# Patient Record
Sex: Female | Born: 1940 | Race: White | Hispanic: No | Marital: Married | Smoking: Never smoker
Health system: Southern US, Community
[De-identification: ages and names within clinical notes are randomized; demographics above are authoritative.]

## PROBLEM LIST (undated history)

## (undated) DIAGNOSIS — M1990 Unspecified osteoarthritis, unspecified site: Secondary | ICD-10-CM

## (undated) DIAGNOSIS — M542 Cervicalgia: Secondary | ICD-10-CM

## (undated) DIAGNOSIS — E079 Disorder of thyroid, unspecified: Secondary | ICD-10-CM

## (undated) DIAGNOSIS — C50919 Malignant neoplasm of unspecified site of unspecified female breast: Secondary | ICD-10-CM

## (undated) DIAGNOSIS — D1803 Hemangioma of intra-abdominal structures: Secondary | ICD-10-CM

## (undated) DIAGNOSIS — T7840XA Allergy, unspecified, initial encounter: Secondary | ICD-10-CM

## (undated) DIAGNOSIS — T8859XA Other complications of anesthesia, initial encounter: Secondary | ICD-10-CM

## (undated) DIAGNOSIS — T4145XA Adverse effect of unspecified anesthetic, initial encounter: Secondary | ICD-10-CM

## (undated) HISTORY — DX: Malignant neoplasm of unspecified site of unspecified female breast: C50.919

## (undated) HISTORY — DX: Allergy, unspecified, initial encounter: T78.40XA

## (undated) HISTORY — PX: OTHER SURGICAL HISTORY: SHX169

## (undated) HISTORY — DX: Unspecified osteoarthritis, unspecified site: M19.90

## (undated) HISTORY — DX: Cervicalgia: M54.2

## (undated) HISTORY — PX: COLONOSCOPY: SHX174

## (undated) HISTORY — DX: Disorder of thyroid, unspecified: E07.9

## (undated) HISTORY — DX: Hemangioma of intra-abdominal structures: D18.03

## (undated) HISTORY — PX: ABDOMINAL HYSTERECTOMY: SHX81

---

## 1986-11-11 HISTORY — PX: BREAST SURGERY: SHX581

## 1999-01-22 ENCOUNTER — Other Ambulatory Visit: Admission: RE | Admit: 1999-01-22 | Discharge: 1999-01-22 | Payer: Self-pay | Admitting: *Deleted

## 1999-03-07 ENCOUNTER — Ambulatory Visit (HOSPITAL_COMMUNITY): Admission: RE | Admit: 1999-03-07 | Discharge: 1999-03-07 | Payer: Self-pay | Admitting: Internal Medicine

## 1999-03-09 ENCOUNTER — Encounter: Payer: Self-pay | Admitting: Internal Medicine

## 1999-03-09 ENCOUNTER — Ambulatory Visit (HOSPITAL_COMMUNITY): Admission: RE | Admit: 1999-03-09 | Discharge: 1999-03-09 | Payer: Self-pay | Admitting: Internal Medicine

## 1999-11-15 ENCOUNTER — Ambulatory Visit (HOSPITAL_COMMUNITY): Admission: RE | Admit: 1999-11-15 | Discharge: 1999-11-15 | Payer: Self-pay | Admitting: Surgery

## 1999-11-15 ENCOUNTER — Encounter: Payer: Self-pay | Admitting: Surgery

## 2000-10-28 ENCOUNTER — Ambulatory Visit (HOSPITAL_COMMUNITY): Admission: RE | Admit: 2000-10-28 | Discharge: 2000-10-28 | Payer: Self-pay | Admitting: Surgery

## 2000-10-28 ENCOUNTER — Encounter: Payer: Self-pay | Admitting: Surgery

## 2001-10-21 ENCOUNTER — Other Ambulatory Visit: Admission: RE | Admit: 2001-10-21 | Discharge: 2001-11-12 | Payer: Self-pay

## 2002-07-07 ENCOUNTER — Ambulatory Visit (HOSPITAL_COMMUNITY): Admission: RE | Admit: 2002-07-07 | Discharge: 2002-07-07 | Payer: Self-pay | Admitting: Surgery

## 2002-07-07 ENCOUNTER — Encounter: Payer: Self-pay | Admitting: Surgery

## 2004-07-18 ENCOUNTER — Ambulatory Visit (HOSPITAL_COMMUNITY): Admission: RE | Admit: 2004-07-18 | Discharge: 2004-07-18 | Payer: Self-pay | Admitting: Surgery

## 2005-05-30 ENCOUNTER — Ambulatory Visit (HOSPITAL_COMMUNITY): Admission: RE | Admit: 2005-05-30 | Discharge: 2005-05-30 | Payer: Self-pay | Admitting: Plastic Surgery

## 2006-12-31 ENCOUNTER — Ambulatory Visit (HOSPITAL_COMMUNITY): Admission: RE | Admit: 2006-12-31 | Discharge: 2006-12-31 | Payer: Self-pay | Admitting: Surgery

## 2010-09-24 ENCOUNTER — Encounter: Payer: Self-pay | Admitting: Internal Medicine

## 2010-11-06 ENCOUNTER — Ambulatory Visit: Payer: Self-pay | Admitting: Internal Medicine

## 2010-11-21 ENCOUNTER — Encounter (INDEPENDENT_AMBULATORY_CARE_PROVIDER_SITE_OTHER): Payer: Self-pay | Admitting: *Deleted

## 2010-12-05 ENCOUNTER — Telehealth: Payer: Self-pay | Admitting: Internal Medicine

## 2010-12-06 ENCOUNTER — Encounter: Payer: Self-pay | Admitting: Internal Medicine

## 2010-12-06 ENCOUNTER — Ambulatory Visit
Admission: RE | Admit: 2010-12-06 | Discharge: 2010-12-06 | Payer: Self-pay | Source: Home / Self Care | Attending: Internal Medicine | Admitting: Internal Medicine

## 2010-12-11 NOTE — Letter (Signed)
Summary: Pre Visit Letter Revised  [facility]  [address] [place], [place] [address]   Phone: [phone]  Fax: [phone]        09/24/2010 MRN: [id number]  Krista Dunn [address] [place], [place]  [address]             Procedure Date:  11-20-2010  3:30pm           Dr Lina Sar  Welcome to the Gastroenterology Division at [facility].    You are scheduled to see a nurse for your pre-procedure visit on 11-06-10 at 3:30pm on the 3rd floor at [facility], [address]  We ask that you try to arrive at our office 15 minutes prior to your appointment time to allow for check-in.  Please take a minute to review the attached form.  If you answer "Yes" to one or more of the questions on the first page, we ask that you call the person listed at your earliest opportunity.  If you answer "No" to all of the questions, please complete the rest of the form and bring it to your appointment.    Your nurse visit will consist of discussing your medical and surgical history, your immediate family medical history, and your medications.   If you are unable to list all of your medications on the form, please bring the medication bottles to your appointment and we will list them.  We will need to be aware of both prescribed and over the counter drugs.  We will need to know exact dosage information as well.    Please be prepared to read and sign documents such as consent forms, a financial agreement, and acknowledgement forms.  If necessary, and with your consent, a friend or relative is welcome to sit-in on the nurse visit with you.  Please bring your insurance card so that we may make a copy of it.  If your insurance requires a referral to see a specialist, please bring your referral form from your primary care physician.  No co-pay is required for this nurse visit.     If you cannot keep your appointment, please call [phone] to cancel or reschedule prior  to your appointment date.  This allows [place] the opportunity to schedule an appointment for another patient in need of care.    Thank you for choosing [facility] for your medical needs.  We appreciate the opportunity to care for you.  Please visit [place] at our website  to learn more about our practice.  Sincerely, The Gastroenterology Division

## 2010-12-13 NOTE — Miscellaneous (Signed)
Summary: [facility]  Clinical Lists Changes  Medications: Added new medication of MOVIPREP 100 GM  SOLR (PEG-KCL-NACL-NASULF-NA ASC-C) As per prep instructions. - Signed Rx of MOVIPREP 100 GM  SOLR (PEG-KCL-NACL-NASULF-NA ASC-C) As per prep instructions.;  #1 x 0;  Signed;  Entered by: Ezra Sites RN;  Authorized by: Hart Carwin MD;  Method used: Electronically to [facility] Dr. [phone]*, [address], [place], [place]  [address], Ph: [phone], Fax: [phone] Allergies: Added new allergy or adverse reaction of EPINEPHRINE Added new allergy or adverse reaction of NOVOCAIN Observations: Added new observation of NKA: F (11/22/2010 12:52)    Prescriptions: MOVIPREP 100 GM  SOLR (PEG-KCL-NACL-NASULF-NA ASC-C) As per prep instructions.  #1 x 0   Entered by:   Ezra Sites RN   Authorized by:   Hart Carwin MD   Signed by:   Ezra Sites RN on 11/22/2010   Method used:   Electronically to        [facility] Dr. [phone]* (retail)       [address]       [address]       [place], [place]  [address]       Ph: [phone]       Fax: [phone]   RxID:   [id number]

## 2010-12-13 NOTE — Progress Notes (Signed)
Summary: Question about prep Colon tomorrow  Phone Note Call from Patient Call back at Home Phone [phone]   Call For: Dr Juanda Chance Summary of Call: Question about her prep Colon tomorrow. Initial call taken by: Leanor Kail [facility],  December 05, 2010 10:42 AM  Follow-up for Phone Call        Answered pts questions about diet pertaining to her colonoscopy tomorrow.  Follow-up by: Jennye Boroughs RN,  December 05, 2010 11:12 AM

## 2010-12-13 NOTE — Letter (Signed)
Summary: [facility] Instructions  [facility]  [address] [place], [place] [address]   Phone: [phone]  Fax: [phone]       Krista Dunn    [birth year]    MRN: [id number]        Procedure Day /Date:  Thursday 12/06/2010     Arrival Time: 1:00 pm     Procedure Time:  2:00 pm     Location of Procedure:                    _x _  [facility] (4th Floor)                        PREPARATION FOR COLONOSCOPY WITH MOVIPREP   Starting 5 days prior to your procedure Saturday 1/21 do not eat nuts, seeds, popcorn, corn, beans, peas,  salads, or any raw vegetables.  Do not take any fiber supplements (e.g. Metamucil, Citrucel, and Benefiber).  THE DAY BEFORE YOUR PROCEDURE         DATE: Wednesday 1/25  1.  Drink clear liquids the entire day-NO SOLID FOOD  2.  Do not drink anything colored red or purple.  Avoid juices with pulp.  No orange juice.  3.  Drink at least 64 oz. (8 glasses) of fluid/clear liquids during the day to prevent dehydration and help the prep work efficiently.  CLEAR LIQUIDS INCLUDE: Water Jello Ice Popsicles Tea (sugar ok, no milk/cream) Powdered fruit flavored drinks Coffee (sugar ok, no milk/cream) Gatorade Juice: apple, white grape, white cranberry  Lemonade Clear bullion, consomm, broth Carbonated beverages (any kind) Strained chicken noodle soup Hard Candy                             4.  In the morning, mix first dose of MoviPrep solution:    Empty 1 Pouch A and 1 Pouch B into the disposable container    Add lukewarm drinking water to the top line of the container. Mix to dissolve    Refrigerate (mixed solution should be used within 24 hrs)  5.  Begin drinking the prep at 5:00 p.m. The MoviPrep container is divided by 4 marks.   Every 15 minutes drink the solution down to the next mark (approximately 8 oz) until the full liter is complete.   6.  Follow completed prep with 16 oz of clear liquid of your choice  (Nothing red or purple).  Continue to drink clear liquids until bedtime.  7.  Before going to bed, mix second dose of MoviPrep solution:    Empty 1 Pouch A and 1 Pouch B into the disposable container    Add lukewarm drinking water to the top line of the container. Mix to dissolve    Refrigerate  THE DAY OF YOUR PROCEDURE      DATE: Thursday 1/26  Beginning at 9:00 a.m. (5 hours before procedure):         1. Every 15 minutes, drink the solution down to the next mark (approx 8 oz) until the full liter is complete.  2. Follow completed prep with 16 oz. of clear liquid of your choice.    3. You may drink clear liquids until 12:00 pm (2 HOURS BEFORE PROCEDURE).   MEDICATION INSTRUCTIONS  Unless otherwise instructed, you should take regular prescription medications with a small sip of water   as early as possible the morning of  your procedure.         OTHER INSTRUCTIONS  You will need a responsible adult at least 69 years of age to accompany you and drive you home.   This person must remain in the waiting room during your procedure.  Wear loose fitting clothing that is easily removed.  Leave jewelry and other valuables at home.  However, you may wish to bring a book to read or  an iPod/MP3 player to listen to music as you wait for your procedure to start.  Remove all body piercing jewelry and leave at home.  Total time from sign-in until discharge is approximately 2-3 hours.  You should go home directly after your procedure and rest.  You can resume normal activities the  day after your procedure.  The day of your procedure you should not:   Drive   Make legal decisions   Operate machinery   Drink alcohol   Return to work  You will receive specific instructions about eating, activities and medications before you leave.    The above instructions have been reviewed and explained to me by   Ezra Sites RN  November 22, 2010 1:30 PM     I fully understand and  can verbalize these instructions _____________________________ Date _________

## 2010-12-13 NOTE — Procedures (Signed)
Summary: Colonoscopy  Patient: Krista Dunn Note: All result statuses are Final unless otherwise noted.  Tests: (1) Colonoscopy (COL)   COL Colonoscopy           DONE     [facility]     [address]     [place], [place]  [address]           COLONOSCOPY PROCEDURE REPORT           PATIENT:  Krista Dunn, Krista Dunn  MR#:  [id number]     BIRTHDATE:  1941/08/29, 69 yrs. old  GENDER:  female     ENDOSCOPIST:  Hedwig Morton. Juanda Chance, MD     REF. BY:  Talbot Grumbling. Creta Levin, M.D.     PROCEDURE DATE:  12/06/2010     PROCEDURE:  Colonoscopy [address]     ASA CLASS:  Class II     INDICATIONS:  colorectal cancer screening, average risk     MEDICATIONS:   Versed 12 mg, Fentanyl 100 mcg           DESCRIPTION OF PROCEDURE:   After the risks benefits and     alternatives of the procedure were thoroughly explained, informed     consent was obtained.  Digital rectal exam was performed and     revealed no rectal masses.   The LB 180AL [id number] endoscope was     introduced through the anus and advanced to the cecum, which was     identified by both the appendix and ileocecal valve, without     limitations.  The quality of the prep was good, using MoviPrep.     The instrument was then slowly withdrawn as the colon was fully     examined.     <<PROCEDUREIMAGES>>           FINDINGS:  Mild diverticulosis was found in the sigmoid colon (see     image1, image7, and image6).  This was otherwise a normal     examination of the colon (see image8, image5, image3, and image4).     Retroflexed views in the rectum revealed no abnormalities.    The     scope was then withdrawn from the patient and the procedure     completed.           COMPLICATIONS:  None     ENDOSCOPIC IMPRESSION:     1) Mild diverticulosis in the sigmoid colon     2) Otherwise normal examination     RECOMMENDATIONS:     1) high fiber diet     REPEAT EXAM:  In 10 year(s) for.           ______________________________     Hedwig Morton. Juanda Chance, MD       CC:           n.     eSIGNED:   Hedwig Morton. Maevis Mumby at 12/06/2010 02:29 PM           Gaspar Garbe, [id number]  Note: An exclamation mark (!) indicates a result that was not dispersed into the flowsheet. Document Creation Date: 12/06/2010 2:30 PM _______________________________________________________________________  (1) Order result status: Final Collection or observation date-time: 12/06/2010 14:21 Requested date-time:  Receipt date-time:  Reported date-time:  Referring Physician:   Ordering Physician: Lina Sar [phone]) Specimen Source:  Source: Launa Grill Order Number: [id number] Lab site:   Appended Document: Colonoscopy    Clinical Lists Changes  Observations: Added new observation of COLONNXTDUE: 11/2020 (12/06/2010 16:20)

## 2011-09-11 ENCOUNTER — Other Ambulatory Visit: Payer: Self-pay | Admitting: Radiology

## 2011-09-11 DIAGNOSIS — C50911 Malignant neoplasm of unspecified site of right female breast: Secondary | ICD-10-CM

## 2011-09-13 ENCOUNTER — Ambulatory Visit
Admission: RE | Admit: 2011-09-13 | Discharge: 2011-09-13 | Disposition: A | Discharge status: Home / Self Care | Payer: Medicare Other | Source: Ambulatory Visit | Attending: Radiology | Admitting: Radiology

## 2011-09-13 ENCOUNTER — Encounter: Payer: Self-pay | Admitting: Oncology

## 2011-09-13 DIAGNOSIS — C50919 Malignant neoplasm of unspecified site of unspecified female breast: Secondary | ICD-10-CM | POA: Insufficient documentation

## 2011-09-13 DIAGNOSIS — C50911 Malignant neoplasm of unspecified site of right female breast: Secondary | ICD-10-CM

## 2011-09-13 HISTORY — DX: Malignant neoplasm of unspecified site of unspecified female breast: C50.919

## 2011-09-13 MED ORDER — GADOBENATE DIMEGLUMINE 529 MG/ML IV SOLN
13.0000 mL | Freq: Once | INTRAVENOUS | Status: AC | PRN
  Administered 2011-09-13: 13 mL via INTRAVENOUS

## 2011-09-18 ENCOUNTER — Encounter (INDEPENDENT_AMBULATORY_CARE_PROVIDER_SITE_OTHER): Payer: Self-pay | Admitting: General Surgery

## 2011-09-18 ENCOUNTER — Encounter: Payer: Self-pay | Admitting: *Deleted

## 2011-09-18 ENCOUNTER — Encounter: Payer: Self-pay | Admitting: Radiation Oncology

## 2011-09-18 ENCOUNTER — Ambulatory Visit: Payer: Medicare Other

## 2011-09-18 ENCOUNTER — Telehealth: Payer: Self-pay | Admitting: *Deleted

## 2011-09-18 ENCOUNTER — Other Ambulatory Visit (HOSPITAL_BASED_OUTPATIENT_CLINIC_OR_DEPARTMENT_OTHER): Payer: Medicare Other

## 2011-09-18 ENCOUNTER — Ambulatory Visit (HOSPITAL_BASED_OUTPATIENT_CLINIC_OR_DEPARTMENT_OTHER): Payer: Medicare Other | Admitting: General Surgery

## 2011-09-18 ENCOUNTER — Ambulatory Visit
Admission: RE | Admit: 2011-09-18 | Discharge: 2011-09-18 | Disposition: A | Discharge status: Home / Self Care | Payer: Medicare Other | Source: Ambulatory Visit | Attending: Radiation Oncology | Admitting: Radiation Oncology

## 2011-09-18 ENCOUNTER — Encounter: Payer: Self-pay | Admitting: Oncology

## 2011-09-18 ENCOUNTER — Ambulatory Visit (HOSPITAL_BASED_OUTPATIENT_CLINIC_OR_DEPARTMENT_OTHER): Payer: Medicare Other | Admitting: Oncology

## 2011-09-18 VITALS — BP 129/76 | HR 73 | Temp 97.9°F | Ht 64.5 in | Wt 166.3 lb

## 2011-09-18 DIAGNOSIS — Z170 Estrogen receptor positive status [ER+]: Secondary | ICD-10-CM

## 2011-09-18 DIAGNOSIS — C50919 Malignant neoplasm of unspecified site of unspecified female breast: Secondary | ICD-10-CM

## 2011-09-18 DIAGNOSIS — C50119 Malignant neoplasm of central portion of unspecified female breast: Secondary | ICD-10-CM

## 2011-09-18 DIAGNOSIS — C50319 Malignant neoplasm of lower-inner quadrant of unspecified female breast: Secondary | ICD-10-CM | POA: Insufficient documentation

## 2011-09-18 DIAGNOSIS — Z510 Encounter for antineoplastic radiation therapy: Secondary | ICD-10-CM | POA: Insufficient documentation

## 2011-09-18 LAB — COMPREHENSIVE METABOLIC PANEL
Albumin: 4.2 g/dL (ref 3.5–5.2)
BUN: 13 mg/dL (ref 6–23)
Calcium: 10.1 mg/dL (ref 8.4–10.5)
Chloride: 101 mEq/L (ref 96–112)
Glucose, Bld: 89 mg/dL (ref 70–99)
Total Bilirubin: 0.9 mg/dL (ref 0.3–1.2)
Total Protein: 7.5 g/dL (ref 6.0–8.3)

## 2011-09-18 LAB — CBC WITH DIFFERENTIAL/PLATELET
Eosinophils Absolute: 0.2 10*3/uL (ref 0.0–0.5)
HGB: 14.4 g/dL (ref 11.6–15.9)
MCHC: 33.9 g/dL (ref 31.5–36.0)
NEUT#: 2.3 10*3/uL (ref 1.5–6.5)
NEUT%: 58.7 % (ref 38.4–76.8)
Platelets: 284 10*3/uL (ref 145–400)
RBC: 4.53 10*6/uL (ref 3.70–5.45)
WBC: 3.9 10*3/uL (ref 3.9–10.3)

## 2011-09-18 LAB — CANCER ANTIGEN 27.29: CA 27.29: 24 U/mL (ref 0–39)

## 2011-09-18 NOTE — Progress Notes (Signed)
New Dx Ca right breast   HPI Krista Dunn is a 70 y.o. female seen in the breast multidisciplinary clinic with a new diagnosis of cancer of the right breast.she recently was evaluated at [facility] for short-term followup of a new area of microcalcifications in the upper inner right breast. Followup mammogram revealed a slightly increased cluster over a small area in the upper inner quadrant. Stereotactic biopsy was performed. 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 She had had no breast symptoms, specifically no lump, pain, nipple discharge, or skin changes. She has a history of a benign right breast lumpectomy years ago in the inferior breast. Subsequent breast MRI shows a single 1.2 cm area of enhancement at the known site of the biopsy-proven malignancy but no other abnormalities identified. Liver hemangiomas were also seen. HPI  Past Medical History  Diagnosis Date  . Breast cancer 09/13/2011  . Neck pain   . Liver hemangioma     been follow on CT scan since 2008  . Arthritis     back    Past Surgical History  Procedure Date  . Abdominal hysterectomy   . Right foot surgery     toe implant    Family History  Problem Relation Age of Onset  . Cancer Maternal Grandmother 60    brain cancer  . Cancer Father 55    colon cancer  . Cancer Mother 54    pancreatic cancer  . Cancer Sister 63    breast cancer  . Cancer Paternal Aunt     breast    Social History History  Substance Use Topics  . Smoking status: Never Smoker   . Smokeless tobacco: Not on file  . Alcohol Use: 2.5 oz/week    5 drink(s) per week    Allergies  Allergen Reactions  . Erythromycin Palpitations  . Epinephrine     REACTION: rapid heart rate  . Procaine Hcl     REACTION: rapid heart rate, heart skips beat    Current  Outpatient Prescriptions  Medication Sig Dispense Refill  . CALCIUM & MAGNESIUM CARBONATES PO Take 1,500 mg by mouth daily.        . cholecalciferol (VITAMIN D) 400 UNITS TABS Take 400 Units by mouth 4 (four) times daily.        . diclofenac (VOLTAREN) 75 MG EC tablet Take 75 mg by mouth 2 (two) times daily.        Marland Kitchen HYDROcodone-acetaminophen (VICODIN) 5-500 MG per tablet Take 1 tablet by mouth as needed.        . Red Yeast Rice 600 MG CAPS Take 600 mg by mouth 2 (two) times daily.          Review of Systems Review of Systems  Constitutional: Negative.   HENT: Negative.   Respiratory: Negative.   Cardiovascular: Negative.   Gastrointestinal: Negative.     There were no vitals taken for this visit.  Physical Exam Physical Exam Gen.: Well-developed female in no distress Skin: Warm and dry without rash or infection Lymph nodes: No cervical, supraclavicular, or axillary nodes palpable Lungs: Clear without wheezing or increased work of breathing Breasts: Slight bruising in healing biopsy site in the upper inner right breast. No palpable masses in either breast. No skin or nipple changes. Cardiovascular: Regular rate and rhythm without murmur. No JVD or edema. Abdomen: Soft  and nontender without masses or organomegaly Extremities: No joint swelling deformity or edema Neurologic: Alert and fully oriented. Gait normal. 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 We discussed at length surgical treatment options. I believe she would be a good candidate for breast conservation and this is what she would prefer. We discussed sentinel lymph node biopsy for staging. We discussed the indications for the procedures, the nature and recovery, risks of bleeding, infection, slight risk of lymphedema, and anesthetic risks, all her questions were  answered.    Plan    Needle localized right breast lumpectomy with right axillary sentinel lymph node biopsy as an outpatient. She will undergo genetic testing but this will not do for her initial surgical treatment. Likely postoperative hormonal treatment.       Nomi Rudnicki T 09/18/2011, 12:23 PM

## 2011-09-18 NOTE — Telephone Encounter (Signed)
GAVE PATIENT APPOINTMENT FOR 10-2011 

## 2011-09-18 NOTE — Progress Notes (Signed)
Encounter addended by: Maryln Gottron, MD on: 09/18/2011 11:51 AM<BR>     Documentation filed: Visit Diagnoses, Notes Section

## 2011-09-18 NOTE — Progress Notes (Addendum)
[facility] Radiation Oncology NEW PATIENT EVALUATION  Name: Krista Dunn MRN: [id number]  Date: 09/18/2011  DOB: Oct 31, 1941  Status:outpatient    CC:No primary provider on file.  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 female who is seen today for discussion of possible radiation therapy in the management of her T1, N0, M0 invasive ductal/DCIS of the right breast. At the time of a screening mammogram on 10/24/2010 [facility]) she was noted to have multiple microcalcifications within the lower inner quadrant of the right breast. A six-month followup mammogram was recommended. She return for repeat mammography on 09/04/2011. There were new, loosely clustered microcalcifications seen superiorly in the right breast with no associated mass. The previously noted calcifications inferiorly remained stable. A stereotactic core biopsy on 09/10/2011 revealed invasive ductal carcinoma along with DCIS with calcifications. The biopsy was at 1 to 2:00. For the invasive portion she was strongly ER/PR positive with a proliferation marker of 11%. She was presented at the breast conference this morning and Dr. Colonel Bald felt that the invasive ductal carcinoma was rather focal. Of note is that she has been on hormone replacement therapy for over 15 years. Marland Kitchen   PREVIOUS RADIATION THERAPY: No   PAST MEDICAL HISTORY: Remarkable for hysterectomy just over 15 years ago. History of right foot surgery with toe implant. History of neck pain related to arthritis for which she receives physical therapy. History of liver are hemangiomas.     PAST SURGICAL HISTORY: Past Surgical History  Procedure Date  . Abdominal hysterectomy   . Right foot surgery     toe implant     ETIOLOGIC FACTORS: History of hormone replacement therapy since  her hysterectomy (greater than 15 years).   FAMILY HISTORY: family history includes Cancer in her paternal aunt; Cancer (age of onset:56) in her sister; Cancer (age of onset:60) in her maternal grandmother; Cancer (age of onset:67) in her father; and Cancer (age of onset:70) in her mother.   SOCIAL HISTORY:  reports that she has never smoked. She does not have any smokeless tobacco history on file. She reports that she drinks about 2.5 ounces of alcohol per week.   ALLERGIES: Erythromycin; Epinephrine; and Procaine hcl   MEDICATIONS: Current outpatient prescriptions:CALCIUM & MAGNESIUM CARBONATES PO, Take 1,500 mg by mouth daily.  , Disp: , Rfl: ;  cholecalciferol (VITAMIN D) 400 UNITS TABS, Take 400 Units by mouth 4 (four) times daily.  , Disp: , Rfl: ;  diclofenac (VOLTAREN) 75 MG EC tablet, Take 75 mg by mouth 2 (two) times daily.  , Disp: , Rfl: ;  HYDROcodone-acetaminophen (VICODIN) 5-500 MG per tablet, Take 1 tablet by mouth as needed.  , Disp: , Rfl:  Red Yeast Rice 600 MG CAPS, Take 600 mg by mouth 2 (two) times daily.  , Disp: , Rfl:   REVIEW OF SYSTEMS:  Pending at the time of this dictation.    PHYSICAL EXAM:  VS BP 129/76,P73,RR 20,T 97.9. Alert and oriented. Head and neck examination unremarkable. Nodes: Without palpable cervical, supraclavicular, or axillary lymphadenopathy. Chest: Lungs clear. Back: Without spinal tenderness. Heart: Regular rate and rhythm. Breasts: There is a needle biopsy wound/ecchymosis at 2:00 along the right breast. Scar along inframammary region. No masses are appreciated. Left breast without masses or lesions. Abdomen without hepatomegaly. Extremities without  edema. Neurologic examination grossly nonfocal.   LABORATORY DATA:  Results for orders placed in visit on 09/18/11 (from the past 48 hour(s))  COMPREHENSIVE METABOLIC PANEL     Status: Normal   Collection Time   09/18/11  9:13 AM      Component Value Range Comment   Sodium 138  135 - 145 (mEq/L)     Potassium 4.2  3.5 - 5.3 (mEq/L)    Chloride 101  96 - 112 (mEq/L)    CO2 27  19 - 32 (mEq/L)    Glucose, Bld 89  70 - 99 (mg/dL)    BUN 13  6 - 23 (mg/dL)    Creatinine, Ser 9.60  0.50 - 1.10 (mg/dL)    Total Bilirubin 0.9  0.3 - 1.2 (mg/dL)    Alkaline Phosphatase 112  39 - 117 (U/L)    AST 33  0 - 37 (U/L)    ALT 33  0 - 35 (U/L)    Total Protein 7.5  6.0 - 8.3 (g/dL)    Albumin 4.2  3.5 - 5.2 (g/dL)    Calcium 45.4  8.4 - 10.5 (mg/dL)              IMPRESSION: Clinical stage I (T1, N0, 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 We agree that she should undergo genetic testing and based on her family history. She will inquire as to whether or not her sister had genetic testing 8 years ago. Local treatment options include mastectomy versus partial cystectomy followed by radiation therapy. She inquires about partial breast radiation and I told her that this was cautionary in patients with DCIS. I discussed the potential acute and late toxicities of radiation therapy. She'll now meet with Dr. Johna Sheriff and Dr.Khan.   PLAN: She will have her genetic testing and conservative surgery including a sentinel lymph node biopsy.  I spent 40 minutes minutes face to face with the patient and more than 50% of that time was spent in counseling and/or coordination of care.

## 2011-09-18 NOTE — Progress Notes (Signed)
Referral MD: Dr. Algie Coffer   Reason for Referral: 70 year old female with new diagnosis of invasive ductal carcinoma of the right breast. Diagnosis was made on 09/10/2011.   Chief Complaint  Patient presents with  . Breast Cancer  : History of present illness: Patient is a very pleasant 70 year old female with the diagnosis of invasive ductal carcinoma of the right breast. She had a medical history significant for having had hip osteoarthritis and liver hemangiomas for which she has been followed. The patient began having screening mammograms at the age of 89. She has been very compliant with her mammograms on a yearly basis. Most recently she had her schedule mammogram on 09/10/2003. This mammogram show an an abnormality  in the right breast. She then went on to have an ultrasound performed that revealed a 1.2 cm nodule in the left breast. She had a biopsy of this performed on 09/10/2011. The biopsy revealed an invasive ductal carcinoma with ductal carcinoma in situ with calcifications. The tumor was estrogen receptor positive progesterone receptor positive and HER-2/neu negative. It was grade 1. She went on to have MRI of the breasts performed on 09/13/2011. The MRI showed the central portion of the right breast post biopsy changes. She was also noted to have a 1.2 x 1.0 x 0.8 centimeter area consistent with her known malignancy in this region. She is now seen in the multidisciplinary breast clinic for discussion of her treatment options. She is without any complaints.     Past Medical History  Diagnosis Date  . Breast cancer 09/13/2011  . Neck pain   . Liver hemangioma     been follow on CT scan since 2008  . Arthritis     back  :  Past Surgical History  Procedure Date  . Abdominal hysterectomy   . Right foot surgery     toe implant  :  Current outpatient prescriptions:CALCIUM & MAGNESIUM CARBONATES PO, Take 1,500 mg by mouth daily.  , Disp: , Rfl: ;  cholecalciferol (VITAMIN D) 400  UNITS TABS, Take 400 Units by mouth 4 (four) times daily.  , Disp: , Rfl: ;  diclofenac (VOLTAREN) 75 MG EC tablet, Take 75 mg by mouth 2 (two) times daily.  , Disp: , Rfl: ;  HYDROcodone-acetaminophen (VICODIN) 5-500 MG per tablet, Take 1 tablet by mouth as needed.  , Disp: , Rfl:  Red Yeast Rice 600 MG CAPS, Take 600 mg by mouth 2 (two) times daily.  , Disp: , Rfl: :    :  Allergies  Allergen Reactions  . Erythromycin Palpitations  . Epinephrine     REACTION: rapid heart rate  . Procaine Hcl     REACTION: rapid heart rate, heart skips beat  :  Family History  Problem Relation Age of Onset  . Cancer Maternal Grandmother 60    brain cancer  . Cancer Father 107    colon cancer  . Cancer Mother 13    pancreatic cancer  . Cancer Sister 34    breast cancer  . Cancer Paternal Aunt     breast  :  History   Social History  . Marital Status: Married    Spouse Name: N/A    Number of Children: N/A  . Years of Education: N/A   Occupational History  . 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 Smoking status: Never Smoker   . Smokeless tobacco: Not on file  . Alcohol Use: 2.5 oz/week  5 drink(s) per week  . Drug Use: Not on file  . Sexually Active: Not on file   Other Topics Concern  . Not on file   Social History Narrative  . No narrative on file  :  Constitutional: negative Eyes: negative Ears, nose, mouth, throat, and face: negative Respiratory: negative Cardiovascular: negative Gastrointestinal: negative Genitourinary:negative Integument/breast: Right breast patient is noted to have area of ecchymosis at the site of her recent biopsy. Left breast reveals no masses nipple discharge or any nipple retraction or dominant masses. Hematologic/lymphatic: negative Musculoskeletal:negative Neurological: negative  Exam: @IPVITALS @ General appearance: alert and cooperative Eyes: conjunctivae/corneas clear. PERRL, EOM's intact. Fundi  benign. Ears: normal TM's and external ear canals both ears Throat: lips, mucosa, and tongue normal; teeth and gums normal Neck: no adenopathy, no carotid bruit, no JVD, supple, symmetrical, trachea midline and thyroid not enlarged, symmetric, no tenderness/mass/nodules Back: symmetric, no curvature. ROM normal. No CVA tenderness. Resp: clear to auscultation bilaterally and normal percussion bilaterally Chest wall: no tenderness Breasts: normal appearance, no masses or tenderness, No nipple retraction or dimpling, No nipple discharge or bleeding, No axillary or supraclavicular adenopathy, Normal to palpation without dominant masses, Right breast reveals area of ecchymosis from her recent biopsy. However she has no other masses no nipple discharge no retractions. Cardio: regular rate and rhythm, S1, S2 normal, no murmur, click, rub or gallop GI: soft, non-tender; bowel sounds normal; no masses,  no organomegaly Extremities: extremities normal, atraumatic, no cyanosis or edema Lymph nodes: Cervical, supraclavicular, and axillary nodes normal. Neurologic: Alert and oriented X 3, normal strength and tone. Normal symmetric reflexes. Normal coordination and gait Sensory: normal Motor: grossly normal Reflexes: 2+ and symmetric   Basename 09/18/11 0900  WBC --  HGB 14.4  HCT 42.4  PLT 284    Basename 09/18/11 0913  NA 138  K 4.2  CL 101  CO2 27  GLUCOSE 89  BUN 13  CREATININE 0.72  CALCIUM 10.1      Pathology: Right needle biopsy performed on 09/10/2011 reveals an invasive ductal carcinoma with ductal carcinoma in situ with calcifications. grade 1 without LV 8. Tumor was ER/PR positive HER-2/neu negative.   Mr Breast Bilateral W Wo Contrast  09/13/2011  *RADIOLOGY REPORT*  Clinical Data: The patient has been recently diagnosed with invasive ductal carcinoma and ductal carcinoma in situ following stereotactic guided core biopsy of the right breast.  BUN and creatinine were obtained on  site at [facility] at [address] Results:  BUN 10 mg/dL,  Creatinine 0.7 mg/dL.  BILATERAL BREAST MRI WITH AND WITHOUT CONTRAST  Technique: Multiplanar, multisequence MR images of both breasts were obtained prior to and following the intravenous administration of 13ml of Multihance.  Three dimensional images were evaluated at the independent DynaCad workstation.  Comparison:  Mammogram from [facility] 09/10/2011 and earlier  Findings: Within the central portion of the right breast, there is post biopsy change, consisting of small fluid collection and biopsy clip artifact.  Just posterior and lateral to the biopsy clip, there is a small area of nodular enhancement.  This measures 1.2 x 1.0 x 0.8 cm and demonstrates primarily persistent type enhancement kinetics.  The findings are consistent with known malignancy in this region.  Elsewhere within the right breast, no suspicious enhancement is identified.  Images of the left breast are unremarkable.  Background parenchymal enhancement is minimal.  No suspicious internal mammary or axillary lymph nodes are identified.  Within the right hepatic lobe, at the  dome, there is a 1.2 cm hyperintense lesion on T2-weighted images.  This demonstrates peripheral, nodular type enhancement, most consistent with a hemangioma.  More inferiorly, involving the left and right hepatic lobe, there is a large lesion measuring 8.9 x 14.1 cm.  This also demonstrates peripheral, nodular type enhancement characteristics, consistent with a benign hemangioma.  Although incompletely imaged, the imaging features are felt to be characteristic for a benign process.  IMPRESSION:  1.  Post biopsy change in the upper central portion of the right breast, consistent with site of recently biopsied known malignancy. Enhancement in this region measures a maximum of 1.2 cm. 2.  No findings suspicious for malignancy on the left. 3.  Multiple hemangiomas within the liver.  THREE-DIMENSIONAL MR  IMAGE RENDERING ON INDEPENDENT WORKSTATION:  Three-dimensional MR images were rendered by post-processing of the original MR data on an independent workstation.  The three- dimensional MR images were interpreted, and findings were reported in the accompanying complete MRI report for this study.  BI-RADS CATEGORY 6:  Known biopsy-proven malignancy - appropriate action should be taken.  Original Report Authenticated By: Patterson Hammersmith, M.D.    Assessment and Plan: A pleasant 70 year old female who appears younger than her stated age with new diagnosis of what sounds like this clinical stage I(T1, N0, M0) invasive ductal carcinoma that is low-grade that is grade 1. As he got positive PR positive HER-2/neu negative. Total size of the tumor is 1.2 cm on MRI findings.  #1 patient was seen in the multidisciplinary clinic today she was seen by Dr. Glenna Fellows with Dr. Chipper Herb and myself. Dr. Johna Sheriff has recommended a lumpectomy with sentinel node biopsy. Which is the appropriate treatment for this early stage disease.  #2 patient was also seen by Dr. Chipper Herb. It has been recommended that she undergo radiation therapy to the breast postlumpectomy.  #3 the patient to discuss systemic treatment. Since patient has a very favorable disease we will plan on doing anti-estrogen therapy consisting of an aromatase inhibitor such as Arimidex. Risks and benefits of Arimidex were discussed with the patient and her husband. He did think clinically she'll do very well. However the final treatments decisions will be made at the time of her final pathology. However pathology shows this higher grade tumor or her HER-2 status is different and we certainly can change her treatment options.  Number for reviewing patient's family history it is found that she is how family history significant for breast and ovarian cancer. There is also family history of pancreatic cancer as noted above. In addition his for  patient to have genetic counseling and testing performed for the BRCA1 and 2 gene mutation. I discussed this extensively with the patient today.  #5 I spent 1 hour with the patient greater than 50% of the time was spent in counseling and coordination of care.  #6 I will plan on seeing the patient back after her surgery in about a month.    Drue Second, MD Medical/Oncology [facility] [phone] (beeper) [phone] (Office)  09/18/2011, 1:35 PM

## 2011-09-19 ENCOUNTER — Encounter: Payer: Self-pay | Admitting: *Deleted

## 2011-09-20 ENCOUNTER — Other Ambulatory Visit (INDEPENDENT_AMBULATORY_CARE_PROVIDER_SITE_OTHER): Payer: Self-pay | Admitting: General Surgery

## 2011-09-20 ENCOUNTER — Telehealth: Payer: Self-pay | Admitting: *Deleted

## 2011-09-20 NOTE — Telephone Encounter (Signed)
Spoke to pt concerning sister hx of breast cancer.  Pt relate sister's breast cancer was only in the right breast, sister chose to have bilateral mastectomies d/t invasive breast cancer.  Sister also did not have genetic counseling performed.  Discussed with pt [facility] clinic from 09/18/11.  She denies needs or concerns at this time.  Encourage pt to call with questions.  Received verbal understanding.  Contact information given.

## 2011-09-20 NOTE — H&P (Signed)
       Krista Dunn  Description:  70 year old female  09/18/2011 12:00 PM Office Visit Provider:  Sherrie Marsan T, MD  MRN: [id number] Department:  [facility]            Diagnoses     Cancer of breast - Primary    174.9                     Krista Dunn T, MD 09/20/2011 11:00 AM Signed  New Dx Ca right breast  HPI  Krista Dunn is a 70 y.o. female seen in the breast multidisciplinary clinic with a new diagnosis of cancer of the right breast.she recently was evaluated at [facility] for short-term followup of a new area of microcalcifications in the upper inner right breast. Followup mammogram revealed a slightly increased cluster over a small area in the upper inner quadrant. Stereotactic biopsy was performed. 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 She had had no breast symptoms, specifically no lump, pain, nipple discharge, or skin changes. She has a history of a benign right breast lumpectomy years ago in the inferior breast. Subsequent breast MRI shows a single 1.2 cm area of enhancement at the known site of the biopsy-proven malignancy but no other abnormalities identified. Liver hemangiomas were also seen.  HPI     Past Medical History     Diagnosis  Date     .  Breast cancer  09/13/2011     .  Neck pain      .  Liver hemangioma        been follow on CT scan since 2008     .  Arthritis        back         Past Surgical History     Procedure  Date     .  Abdominal hysterectomy      .  Right foot surgery        toe implant         Family History     Problem  Relation  Age of Onset     .  Cancer  Maternal Grandmother  60       brain cancer      .  Cancer  Father  67        colon cancer      .  Cancer  Mother  70        pancreatic cancer      .  Cancer  Sister  56         breast cancer      .  Cancer  Paternal Aunt         breast       Social History      History      Substance Use Topics      .  Smoking status:  Never Smoker      .  Smokeless tobacco:  Not on file      .  Alcohol Use:  2.5 oz/week        5 drink(s) per week           Allergies      Allergen  Reactions      .  Erythromycin  Palpitations      .    Epinephrine         REACTION: rapid heart rate      .  Procaine Hcl         REACTION: rapid heart rate, heart skips beat           Current Outpatient Prescriptions      Medication  Sig  Dispense  Refill      .  CALCIUM & MAGNESIUM CARBONATES PO  Take 1,500 mg by mouth daily.        .  cholecalciferol (VITAMIN D) 400 UNITS TABS  Take 400 Units by mouth 4 (four) times daily.        .  diclofenac (VOLTAREN) 75 MG EC tablet  Take 75 mg by mouth 2 (two) times daily.        .  HYDROcodone-acetaminophen (VICODIN) 5-500 MG per tablet  Take 1 tablet by mouth as needed.        .  Red Yeast Rice 600 MG CAPS  Take 600 mg by mouth 2 (two) times daily.         Review of Systems  Review of Systems  Constitutional: Negative.  HENT: Negative.  Respiratory: Negative.  Cardiovascular: Negative.  Gastrointestinal: Negative.   There were no vitals taken for this visit.  Physical Exam  Physical Exam  Gen.: Well-developed female in no distress  Skin: Warm and dry without rash or infection  Lymph nodes: No cervical, supraclavicular, or axillary nodes palpable  Lungs: Clear without wheezing or increased work of breathing  Breasts: Slight bruising in healing biopsy site in the upper inner right breast. No palpable masses in either breast. No skin or nipple changes.  Cardiovascular: Regular rate and rhythm without murmur. No JVD or edema.  Abdomen: Soft and nontender without masses or organomegaly  Extremities: No joint swelling deformity or edema  Neurologic: Alert and fully oriented. Gait normal.  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 We discussed at length surgical treatment options. I believe she would be a good candidate for breast conservation and this is what she would prefer. We discussed sentinel lymph node biopsy for staging. We discussed the indications for the procedures, the nature and recovery, risks of bleeding, infection, slight risk of lymphedema, and anesthetic risks, all her questions were answered.   Plan   Needle localized right breast lumpectomy with right axillary sentinel lymph node biopsy as an outpatient. She will undergo genetic testing but this will not do for her initial surgical treatment. Likely postoperative hormonal treatment.   Krista Dunn T  09/18/2011, 12:23 PM                 All Charges for This Encounter       Code  Description  Service Date  Service Provider  Modifiers  Quantity    99203  PR OFFICE/OUTPT VISIT,NEW,LEVL III  09/18/2011  Areen Trautner T Delancey Moraes, MD   1                Other Encounter Related Information     Allergies & Medications      Problem List      History      Patient-Entered Questionnaires       No data filed                      

## 2011-09-23 ENCOUNTER — Other Ambulatory Visit (INDEPENDENT_AMBULATORY_CARE_PROVIDER_SITE_OTHER): Payer: Self-pay | Admitting: General Surgery

## 2011-09-24 ENCOUNTER — Encounter: Payer: Self-pay | Admitting: Oncology

## 2011-09-26 ENCOUNTER — Telehealth: Payer: Self-pay | Admitting: Oncology

## 2011-09-26 ENCOUNTER — Encounter: Payer: Self-pay | Admitting: *Deleted

## 2011-09-26 NOTE — Progress Notes (Signed)
Mailed after appt letter to pt. 

## 2011-09-27 ENCOUNTER — Ambulatory Visit: Payer: Medicare Other

## 2011-09-27 NOTE — Progress Notes (Signed)
Pt seen for genetic counseling.  Blood drawn for BRCA 1/2

## 2011-10-09 ENCOUNTER — Encounter (HOSPITAL_BASED_OUTPATIENT_CLINIC_OR_DEPARTMENT_OTHER): Payer: Self-pay | Admitting: *Deleted

## 2011-10-09 NOTE — Progress Notes (Signed)
To come in for ekg- 

## 2011-10-11 ENCOUNTER — Other Ambulatory Visit (INDEPENDENT_AMBULATORY_CARE_PROVIDER_SITE_OTHER): Payer: Self-pay | Admitting: General Surgery

## 2011-10-11 ENCOUNTER — Other Ambulatory Visit (INDEPENDENT_AMBULATORY_CARE_PROVIDER_SITE_OTHER): Payer: Self-pay

## 2011-10-11 DIAGNOSIS — N6310 Unspecified lump in the right breast, unspecified quadrant: Secondary | ICD-10-CM

## 2011-10-11 DIAGNOSIS — C50911 Malignant neoplasm of unspecified site of right female breast: Secondary | ICD-10-CM

## 2011-10-14 ENCOUNTER — Other Ambulatory Visit (INDEPENDENT_AMBULATORY_CARE_PROVIDER_SITE_OTHER): Payer: Self-pay | Admitting: General Surgery

## 2011-10-14 ENCOUNTER — Ambulatory Visit (HOSPITAL_COMMUNITY)
Admission: RE | Admit: 2011-10-14 | Discharge: 2011-10-14 | Disposition: A | Discharge status: Home / Self Care | Payer: Medicare Other | Source: Ambulatory Visit | Attending: General Surgery | Admitting: General Surgery

## 2011-10-14 ENCOUNTER — Encounter (HOSPITAL_BASED_OUTPATIENT_CLINIC_OR_DEPARTMENT_OTHER): Payer: Self-pay | Admitting: Anesthesiology

## 2011-10-14 ENCOUNTER — Encounter (HOSPITAL_BASED_OUTPATIENT_CLINIC_OR_DEPARTMENT_OTHER): Payer: Self-pay | Admitting: Certified Registered"

## 2011-10-14 ENCOUNTER — Ambulatory Visit (HOSPITAL_BASED_OUTPATIENT_CLINIC_OR_DEPARTMENT_OTHER)
Admission: RE | Admit: 2011-10-14 | Discharge: 2011-10-14 | Disposition: A | Discharge status: Home / Self Care | Payer: Medicare Other | Source: Ambulatory Visit | Attending: General Surgery | Admitting: General Surgery

## 2011-10-14 ENCOUNTER — Ambulatory Visit: Payer: Medicare Other

## 2011-10-14 ENCOUNTER — Ambulatory Visit (HOSPITAL_BASED_OUTPATIENT_CLINIC_OR_DEPARTMENT_OTHER): Payer: Medicare Other | Admitting: Certified Registered"

## 2011-10-14 ENCOUNTER — Other Ambulatory Visit (HOSPITAL_COMMUNITY): Payer: Medicare Other

## 2011-10-14 ENCOUNTER — Encounter (HOSPITAL_BASED_OUTPATIENT_CLINIC_OR_DEPARTMENT_OTHER)
Admission: RE | Disposition: A | Discharge status: Home / Self Care | Payer: Self-pay | Source: Ambulatory Visit | Attending: General Surgery

## 2011-10-14 DIAGNOSIS — N6310 Unspecified lump in the right breast, unspecified quadrant: Secondary | ICD-10-CM

## 2011-10-14 DIAGNOSIS — C50919 Malignant neoplasm of unspecified site of unspecified female breast: Secondary | ICD-10-CM

## 2011-10-14 DIAGNOSIS — D0590 Unspecified type of carcinoma in situ of unspecified breast: Secondary | ICD-10-CM | POA: Insufficient documentation

## 2011-10-14 DIAGNOSIS — Z01812 Encounter for preprocedural laboratory examination: Secondary | ICD-10-CM | POA: Insufficient documentation

## 2011-10-14 DIAGNOSIS — C50219 Malignant neoplasm of upper-inner quadrant of unspecified female breast: Secondary | ICD-10-CM | POA: Insufficient documentation

## 2011-10-14 HISTORY — DX: Adverse effect of unspecified anesthetic, initial encounter: T41.45XA

## 2011-10-14 HISTORY — DX: Other complications of anesthesia, initial encounter: T88.59XA

## 2011-10-14 HISTORY — PX: LYMPH NODE BIOPSY: SHX201

## 2011-10-14 SURGERY — BREAST LUMPECTOMY WITH SENTINEL LYMPH NODE BX
Anesthesia: General | Site: Breast | Laterality: Right | Wound class: Clean

## 2011-10-14 MED ORDER — MEPERIDINE HCL 25 MG/ML IJ SOLN: 6.2500 mg | INTRAMUSCULAR | Status: DC | PRN

## 2011-10-14 MED ORDER — BUPIVACAINE HCL (PF) 0.25 % IJ SOLN
INTRAMUSCULAR | Status: DC | PRN
  Administered 2011-10-14: 36 mL

## 2011-10-14 MED ORDER — FENTANYL CITRATE 0.05 MG/ML IJ SOLN
INTRAMUSCULAR | Status: DC | PRN
  Administered 2011-10-14: 50 ug via INTRAVENOUS
  Administered 2011-10-14 (×2): 25 ug via INTRAVENOUS

## 2011-10-14 MED ORDER — MIDAZOLAM HCL 2 MG/2ML IJ SOLN
0.5000 mg | INTRAMUSCULAR | Status: DC | PRN
  Administered 2011-10-14: 1 mg via INTRAVENOUS

## 2011-10-14 MED ORDER — CEFAZOLIN SODIUM 1-5 GM-% IV SOLN
1.0000 g | INTRAVENOUS | Status: AC
  Administered 2011-10-14: 1 g via INTRAVENOUS

## 2011-10-14 MED ORDER — ACETAMINOPHEN 10 MG/ML IV SOLN
1000.0000 mg | Freq: Four times a day (QID) | INTRAVENOUS | Status: DC
  Administered 2011-10-14: 1000 mg via INTRAVENOUS

## 2011-10-14 MED ORDER — FENTANYL CITRATE 0.05 MG/ML IJ SOLN: 50.0000 ug | INTRAMUSCULAR | Status: DC | PRN

## 2011-10-14 MED ORDER — LIDOCAINE HCL (CARDIAC) 20 MG/ML IV SOLN
INTRAVENOUS | Status: DC | PRN
  Administered 2011-10-14: 40 mg via INTRAVENOUS

## 2011-10-14 MED ORDER — METHYLENE BLUE 1 % INJ SOLN
INTRAMUSCULAR | Status: DC | PRN
  Administered 2011-10-14: 2 mL via INTRADERMAL

## 2011-10-14 MED ORDER — DROPERIDOL 2.5 MG/ML IJ SOLN
INTRAMUSCULAR | Status: DC | PRN
  Administered 2011-10-14: 0.625 mg via INTRAVENOUS

## 2011-10-14 MED ORDER — FENTANYL CITRATE 0.05 MG/ML IJ SOLN
50.0000 ug | INTRAMUSCULAR | Status: DC | PRN
  Administered 2011-10-14: 50 ug via INTRAVENOUS

## 2011-10-14 MED ORDER — DEXAMETHASONE SODIUM PHOSPHATE 4 MG/ML IJ SOLN
INTRAMUSCULAR | Status: DC | PRN
  Administered 2011-10-14: 10 mg via INTRAVENOUS

## 2011-10-14 MED ORDER — LACTATED RINGERS IV SOLN
INTRAVENOUS | Status: DC
  Administered 2011-10-14 (×2): via INTRAVENOUS

## 2011-10-14 MED ORDER — PROPOFOL 10 MG/ML IV EMUL
INTRAVENOUS | Status: DC | PRN
  Administered 2011-10-14: 130 mg via INTRAVENOUS

## 2011-10-14 MED ORDER — SODIUM CHLORIDE 0.9 % IJ SOLN
INTRAMUSCULAR | Status: DC | PRN
  Administered 2011-10-14: 3 mL

## 2011-10-14 MED ORDER — PROMETHAZINE HCL 25 MG/ML IJ SOLN: 6.2500 mg | INTRAMUSCULAR | Status: DC | PRN

## 2011-10-14 MED ORDER — MIDAZOLAM HCL 5 MG/5ML IJ SOLN
INTRAMUSCULAR | Status: DC | PRN
  Administered 2011-10-14: 1 mg via INTRAVENOUS

## 2011-10-14 MED ORDER — TECHNETIUM TC 99M SULFUR COLLOID FILTERED
1.0000 | Freq: Once | INTRAVENOUS | Status: AC | PRN
  Administered 2011-10-14: 1 via INTRADERMAL

## 2011-10-14 MED ORDER — HYDROCODONE-ACETAMINOPHEN 7.5-750 MG PO TABS: 1.0000 | ORAL_TABLET | ORAL | Status: AC | PRN

## 2011-10-14 MED ORDER — HYDROMORPHONE HCL PF 1 MG/ML IJ SOLN
0.2500 mg | INTRAMUSCULAR | Status: DC | PRN
  Administered 2011-10-14 (×2): 0.5 mg via INTRAVENOUS

## 2011-10-14 MED ORDER — MIDAZOLAM HCL 2 MG/2ML IJ SOLN: 1.0000 mg | INTRAMUSCULAR | Status: DC | PRN

## 2011-10-14 MED ORDER — ONDANSETRON HCL 4 MG/2ML IJ SOLN
INTRAMUSCULAR | Status: DC | PRN
  Administered 2011-10-14: 4 mg via INTRAVENOUS

## 2011-10-14 SURGICAL SUPPLY — 64 items
ADH SKN CLS APL DERMABOND .7 (GAUZE/BANDAGES/DRESSINGS) ×2
APPLIER CLIP 11 MED OPEN (CLIP)
APR CLP MED 11 20 MLT OPN (CLIP)
BINDER BREAST MEDIUM (GAUZE/BANDAGES/DRESSINGS) ×1 IMPLANT
BINDER BREAST XLRG (GAUZE/BANDAGES/DRESSINGS) ×1 IMPLANT
BLADE SURG 10 STRL SS (BLADE) ×3 IMPLANT
BLADE SURG 15 STRL LF DISP TIS (BLADE) ×2 IMPLANT
BLADE SURG 15 STRL SS (BLADE) ×3
CANISTER SUCTION 1200CC (MISCELLANEOUS) ×3 IMPLANT
CHLORAPREP W/TINT 26ML (MISCELLANEOUS) ×3 IMPLANT
CLIP APPLIE 11 MED OPEN (CLIP) IMPLANT
CLIP TI WIDE RED SMALL 6 (CLIP) ×3 IMPLANT
CLOTH BEACON ORANGE TIMEOUT ST (SAFETY) ×3 IMPLANT
COVER MAYO STAND STRL (DRAPES) ×3 IMPLANT
COVER PROBE W GEL 5X96 (DRAPES) ×3 IMPLANT
COVER TABLE BACK 60X90 (DRAPES) ×3 IMPLANT
DECANTER SPIKE VIAL GLASS SM (MISCELLANEOUS) IMPLANT
DERMABOND ADVANCED (GAUZE/BANDAGES/DRESSINGS) ×1
DERMABOND ADVANCED .7 DNX12 (GAUZE/BANDAGES/DRESSINGS) ×4 IMPLANT
DEVICE DUBIN W/COMP PLATE 8390 (MISCELLANEOUS) ×3 IMPLANT
DRAIN CHANNEL 19F RND (DRAIN) IMPLANT
DRAIN HEMOVAC 1/8 X 5 (WOUND CARE) IMPLANT
DRAPE LAPAROSCOPIC ABDOMINAL (DRAPES) ×3 IMPLANT
DRAPE UTILITY XL STRL (DRAPES) ×3 IMPLANT
ELECT COATED BLADE 2.86 ST (ELECTRODE) ×3 IMPLANT
ELECT REM PT RETURN 9FT ADLT (ELECTROSURGICAL) ×3
ELECTRODE REM PT RTRN 9FT ADLT (ELECTROSURGICAL) ×2 IMPLANT
EVACUATOR SILICONE 100CC (DRAIN) IMPLANT
GLOVE BIO SURGEON STRL SZ7 (GLOVE) ×1 IMPLANT
GLOVE BIOGEL M STRL SZ7.5 (GLOVE) ×1 IMPLANT
GLOVE BIOGEL PI IND STRL 8 (GLOVE) ×2 IMPLANT
GLOVE BIOGEL PI INDICATOR 8 (GLOVE) ×1
GLOVE INDICATOR 8.0 STRL GRN (GLOVE) ×1 IMPLANT
GLOVE SS BIOGEL STRL SZ 7.5 (GLOVE) ×2 IMPLANT
GLOVE SUPERSENSE BIOGEL SZ 7.5 (GLOVE) ×2
GOWN PREVENTION PLUS XLARGE (GOWN DISPOSABLE) ×3 IMPLANT
GOWN PREVENTION PLUS XXLARGE (GOWN DISPOSABLE) ×4 IMPLANT
KIT MARKER MARGIN INK (KITS) ×3 IMPLANT
NDL HYPO 25X1 1.5 SAFETY (NEEDLE) ×4 IMPLANT
NDL SAFETY ECLIPSE 18X1.5 (NEEDLE) ×2 IMPLANT
NEEDLE HYPO 18GX1.5 SHARP (NEEDLE) ×3
NEEDLE HYPO 25X1 1.5 SAFETY (NEEDLE) ×6 IMPLANT
NS IRRIG 1000ML POUR BTL (IV SOLUTION) ×3 IMPLANT
PACK BASIN DAY SURGERY FS (CUSTOM PROCEDURE TRAY) ×3 IMPLANT
PAD ALCOHOL SWAB (MISCELLANEOUS) ×3 IMPLANT
PENCIL BUTTON HOLSTER BLD 10FT (ELECTRODE) ×3 IMPLANT
PIN SAFETY STERILE (MISCELLANEOUS) IMPLANT
SLEEVE SCD COMPRESS KNEE MED (MISCELLANEOUS) ×1 IMPLANT
SPONGE LAP 18X18 X RAY DECT (DISPOSABLE) IMPLANT
SPONGE LAP 4X18 X RAY DECT (DISPOSABLE) ×3 IMPLANT
STAPLER VISISTAT 35W (STAPLE) IMPLANT
SUT ETHILON 3 0 FSL (SUTURE) IMPLANT
SUT MON AB 4-0 PC3 18 (SUTURE) IMPLANT
SUT MON AB 5-0 PS2 18 (SUTURE) ×3 IMPLANT
SUT SILK 3 0 SH 30 (SUTURE) IMPLANT
SUT VIC AB 3-0 SH 27 (SUTURE) ×3
SUT VIC AB 3-0 SH 27X BRD (SUTURE) IMPLANT
SUT VICRYL 3-0 CR8 SH (SUTURE) ×1 IMPLANT
SYR CONTROL 10ML LL (SYRINGE) ×6 IMPLANT
TOWEL OR 17X24 6PK STRL BLUE (TOWEL DISPOSABLE) ×6 IMPLANT
TOWEL OR NON WOVEN STRL DISP B (DISPOSABLE) ×3 IMPLANT
TUBE CONNECTING 20X1/4 (TUBING) ×3 IMPLANT
WATER STERILE IRR 1000ML POUR (IV SOLUTION) ×2 IMPLANT
YANKAUER SUCT BULB TIP NO VENT (SUCTIONS) ×3 IMPLANT

## 2011-10-14 NOTE — Anesthesia Preprocedure Evaluation (Addendum)
Anesthesia Evaluation  Patient identified by MRN, date of birth, ID band Patient awake    Reviewed: Allergy & Precautions, H&P , NPO status , Patient's Chart, lab work & pertinent test results  Airway Mallampati: III TM Distance: >3 FB Neck ROM: full    Dental No notable dental hx. (+) Teeth Intact and Caps,    Pulmonary neg pulmonary ROS,  clear to auscultation  Pulmonary exam normal       Cardiovascular neg cardio ROS regular Normal    Neuro/Psych Negative Neurological ROS  Negative Psych ROS   GI/Hepatic negative GI ROS, Neg liver ROS,   Endo/Other  Negative Endocrine ROS  Renal/GU negative Renal ROS  Genitourinary negative   Musculoskeletal   Abdominal   Peds  Hematology negative hematology ROS (+)   Anesthesia Other Findings   Reproductive/Obstetrics negative OB ROS                          Anesthesia Physical Anesthesia Plan  ASA: II  Anesthesia Plan: General   Post-op Pain Management:    Induction: Intravenous  Airway Management Planned: LMA  Additional Equipment:   Intra-op Plan:   Post-operative Plan: Extubation in OR  Informed Consent: I have reviewed the patients History and Physical, chart, labs and discussed the procedure including the risks, benefits and alternatives for the proposed anesthesia with the patient or authorized representative who has indicated his/her understanding and acceptance.     Plan Discussed with: CRNA and Surgeon  Anesthesia Plan Comments:         Anesthesia Quick Evaluation

## 2011-10-14 NOTE — Interval H&P Note (Signed)
History and Physical Interval Note:  10/14/2011 11:47 AM  Krista Dunn  has presented today for surgery, with the diagnosis of right breast cancer   The various methods of treatment have been discussed with the patient and family. After consideration of risks, benefits and other options for treatment, the patient has consented to  Procedure(s): RIGHT BREAST LUMPECTOMY WITH SENTINEL LYMPH NODE BX LYMPH NODE BIOPSY as a surgical intervention .  The patients' history has been reviewed, patient examined, no change in status, stable for surgery.  I have reviewed the patients' chart and labs.  Questions were answered to the patient's satisfaction.     [name] T

## 2011-10-14 NOTE — Op Note (Signed)
  Surgeon: Glenna Fellows T   Assistants: None  Anesthesia: General LMA anesthesia  Indications: patient is a 70 year old female with a recent diagnosis of T1 B. N0 for invasive carcinoma of the right breast discovered with microcalcifications on screening mammogram. The lesion appears to be about 1.2 cm. The after discussion of initial surgical treatment options we have elected to proceed with breast conservation with needle localized lumpectomy and sentinel lymph node biopsy. We discussed the nature of the procedure, indications, risks of anesthetic complications, bleeding, infection, rare risk of lymphedema, and possible need for further surgery based on final pathology.    Procedure Detail: patient is brought to the operating room placed in the supine position on the operating table and laryngeal mask general anesthesia was induced. She had undergone needle localization preoperatively. Also preoperatively 1 mCi of technetium sulfur colloid was injected by nuclear medicine subcutaneously around the areola. After patient timeout procedure under sterile technique 10 cc of dilute methylene blue was injected subcutaneously beneath the right nipple and massaged. The entire right chest axilla upper arm were widely sterilely prepped and draped. I made a curvilinear incision in the upper inner quadrant of the breast and dissection was carried down through the subcutaneous to the breast capsule. The wire was brought into the incision. A generous specimen of tissue was then excised around the shaft and tip of the wire using cautery. The specimen was inked for margins and specimen radiograph obtained showing the clip and the lesion within the specimen although it was relatively closer to the lateral and inferior margins. I then reexcised about 1 cm additional margin incorporating the lateral and inferior margins and this was oriented and sent as a separate specimen. The lumpectomy site was irrigated,  infiltrated with local anesthesia and complete hemostasis obtained. The tumor bed was marked with clips. The breast tissue and subcutaneous tissue was closed with interrupted 3-0 Vicryl. Skin was closed with subcuticular Monocryl. Attention was then turned to the node biopsy. A hot area in the axilla was found with the neoprobe and a small transverse incision made dissection was carried down through the saphenous tissue with cautery and the clavipectoral fascia incised. Using the neoprobe for died careful blunt dissection was carried down into the axilla and a bright blue lymph node with high counts was dissected. It was completely removed cautery and ex vivo had counts of 1200 with a background of 10 or less. This was sent as hot blue right axillary sentinel lymph node. Soft tissue was infiltrated with Marcaine and closed in layers with interrupted 3-0 Vicryl and subcuticular Monocryl. Dermabond was used on both incisions. The sponge needle and instrument counts were correct. The patient was taken to PACU in good condition.   Estimated Blood Loss:  Minimal         Drains: None        Blood Given: none          Specimens: !  1.breast tissue 2.R axillary LN        Complications:  * No complications entered in OR log *         Disposition: PACU - hemodynamically stable.         Condition: stable  Mariella Saa MD, FACS  10/14/2011, 2:05 PM

## 2011-10-14 NOTE — Anesthesia Postprocedure Evaluation (Signed)
  Anesthesia Post-op Note  Patient: Krista Dunn  Procedure(s) Performed:  BREAST LUMPECTOMY WITH SENTINEL LYMPH NODE BX - needle localization at [facility] 9:30/ nuclear medicine injection at 11:30; LYMPH NODE BIOPSY  Patient Location: PACU  Anesthesia Type: General  Level of Consciousness: sedated  Airway and Oxygen Therapy: Patient Spontanous Breathing  Post-op Pain: none  Post-op Assessment: Post-op Vital signs reviewed, Patient's Cardiovascular Status Stable, Respiratory Function Stable, Patent Airway and No signs of Nausea or vomiting  Post-op Vital Signs: Reviewed and stable  Complications: No apparent anesthesia complications

## 2011-10-14 NOTE — Transfer of Care (Signed)
Immediate Anesthesia Transfer of Care Note  Patient: Krista Dunn  Procedure(s) Performed:  BREAST LUMPECTOMY WITH SENTINEL LYMPH NODE BX - needle localization at [facility] 9:30/ nuclear medicine injection at 11:30; LYMPH NODE BIOPSY  Patient Location: PACU  Anesthesia Type: General  Level of Consciousness: sedated  Airway & Oxygen Therapy: Patient Spontanous Breathing and Patient connected to face mask oxygen  Post-op Assessment: Report given to PACU RN and Post -op Vital signs reviewed and stable  Post vital signs: Reviewed and stable  Complications: No apparent anesthesia complications

## 2011-10-14 NOTE — Anesthesia Procedure Notes (Signed)
Procedure Name: LMA Insertion Date/Time: 10/14/2011 12:33 PM Performed by: Radford Pax Pre-anesthesia Checklist: Patient identified, Emergency Drugs available, Suction available, Patient being monitored and Timeout performed Patient Re-evaluated:Patient Re-evaluated prior to inductionOxygen Delivery Method: Circle System Utilized Preoxygenation: Pre-oxygenation with 100% oxygen Intubation Type: IV induction Ventilation: Mask ventilation without difficulty LMA: LMA inserted LMA Size: 4.0 Number of attempts: 1 (atraumatic) Airway Equipment and Method: bite block (bite gard used on rt. posterior) Placement Confirmation: positive ETCO2 Tube secured with: Tape (paper tape) Dental Injury: Teeth and Oropharynx as per pre-operative assessment

## 2011-10-14 NOTE — H&P (View-Only) (Signed)
Krista Dunn  Description:  70 year old female  09/18/2011 12:00 PM Office Visit Provider:  Mariella Saa, MD  MRN: [id number] Department:  [facility]            Diagnoses     Cancer of breast - Primary    174.9                     Mariella Saa, MD 09/20/2011 11:00 AM Signed  New Dx Ca right breast  HPI  Krista Dunn is a 70 y.o. female seen in the breast multidisciplinary clinic with a new diagnosis of cancer of the right breast.she recently was evaluated at [facility] for short-term followup of a new area of microcalcifications in the upper inner right breast. Followup mammogram revealed a slightly increased cluster over a small area in the upper inner quadrant. Stereotactic biopsy was performed. 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 She had had no breast symptoms, specifically no lump, pain, nipple discharge, or skin changes. She has a history of a benign right breast lumpectomy years ago in the inferior breast. Subsequent breast MRI shows a single 1.2 cm area of enhancement at the known site of the biopsy-proven malignancy but no other abnormalities identified. Liver hemangiomas were also seen.  HPI     Past Medical History     Diagnosis  Date     .  Breast cancer  09/13/2011     .  Neck pain      .  Liver hemangioma        been follow on CT scan since 2008     .  Arthritis        back         Past Surgical History     Procedure  Date     .  Abdominal hysterectomy      .  Right foot surgery        toe implant         Family History     Problem  Relation  Age of Onset     .  Cancer  Maternal Grandmother  60       brain cancer      .  Cancer  Father  69        colon cancer      .  Cancer  Mother  4        pancreatic cancer      .  Cancer  Sister  4         breast cancer      .  Cancer  Paternal Aunt         breast       Social History      History      Substance Use Topics      .  Smoking status:  Never Smoker      .  Smokeless tobacco:  Not on file      .  Alcohol Use:  2.5 oz/week        5 drink(s) per week           Allergies      Allergen  Reactions      .  Erythromycin  Palpitations      .  Epinephrine         REACTION: rapid heart rate      .  Procaine Hcl         REACTION: rapid heart rate, heart skips beat           Current Outpatient Prescriptions      Medication  Sig  Dispense  Refill      .  CALCIUM & MAGNESIUM CARBONATES PO  Take 1,500 mg by mouth daily.        .  cholecalciferol (VITAMIN D) 400 UNITS TABS  Take 400 Units by mouth 4 (four) times daily.        .  diclofenac (VOLTAREN) 75 MG EC tablet  Take 75 mg by mouth 2 (two) times daily.        Marland Kitchen  HYDROcodone-acetaminophen (VICODIN) 5-500 MG per tablet  Take 1 tablet by mouth as needed.        .  Red Yeast Rice 600 MG CAPS  Take 600 mg by mouth 2 (two) times daily.         Review of Systems  Review of Systems  Constitutional: Negative.  HENT: Negative.  Respiratory: Negative.  Cardiovascular: Negative.  Gastrointestinal: Negative.   There were no vitals taken for this visit.  Physical Exam  Physical Exam  Gen.: Well-developed female in no distress  Skin: Warm and dry without rash or infection  Lymph nodes: No cervical, supraclavicular, or axillary nodes palpable  Lungs: Clear without wheezing or increased work of breathing  Breasts: Slight bruising in healing biopsy site in the upper inner right breast. No palpable masses in either breast. No skin or nipple changes.  Cardiovascular: Regular rate and rhythm without murmur. No JVD or edema.  Abdomen: Soft and nontender without masses or organomegaly  Extremities: No joint swelling deformity or edema  Neurologic: Alert and fully oriented. Gait normal.  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 We discussed at length surgical treatment options. I believe she would be a good candidate for breast conservation and this is what she would prefer. We discussed sentinel lymph node biopsy for staging. We discussed the indications for the procedures, the nature and recovery, risks of bleeding, infection, slight risk of lymphedema, and anesthetic risks, all her questions were answered.   Plan   Needle localized right breast lumpectomy with right axillary sentinel lymph node biopsy as an outpatient. She will undergo genetic testing but this will not do for her initial surgical treatment. Likely postoperative hormonal treatment.   Amir Glaus T  09/18/2011, 12:23 PM                 All Charges for This Encounter       Code  Description  Service Date  Service Provider  Modifiers  Quantity    [phone]  PR OFFICE/OUTPT VISIT,NEW,LEVL III  09/18/2011  Mariella Saa, MD   1                Other Encounter Related Information     Allergies & Medications      Problem List      History      Patient-Entered Questionnaires       No data filed

## 2011-10-18 ENCOUNTER — Encounter (HOSPITAL_BASED_OUTPATIENT_CLINIC_OR_DEPARTMENT_OTHER): Payer: Self-pay | Admitting: General Surgery

## 2011-10-18 ENCOUNTER — Telehealth (INDEPENDENT_AMBULATORY_CARE_PROVIDER_SITE_OTHER): Payer: Self-pay | Admitting: General Surgery

## 2011-10-18 NOTE — Telephone Encounter (Signed)
Looking for pathology results from surgery. Please review and call patient.

## 2011-10-18 NOTE — Telephone Encounter (Signed)
Called the patient and discuss pathology results.

## 2011-10-23 ENCOUNTER — Other Ambulatory Visit: Payer: Medicare Other | Admitting: Lab

## 2011-10-23 ENCOUNTER — Telehealth: Payer: Self-pay | Admitting: *Deleted

## 2011-10-23 ENCOUNTER — Ambulatory Visit (HOSPITAL_BASED_OUTPATIENT_CLINIC_OR_DEPARTMENT_OTHER): Payer: Medicare Other | Admitting: Oncology

## 2011-10-23 ENCOUNTER — Telehealth (INDEPENDENT_AMBULATORY_CARE_PROVIDER_SITE_OTHER): Payer: Self-pay | Admitting: General Surgery

## 2011-10-23 VITALS — BP 148/72 | HR 80 | Temp 98.1°F | Ht 65.5 in | Wt 166.2 lb

## 2011-10-23 DIAGNOSIS — C50919 Malignant neoplasm of unspecified site of unspecified female breast: Secondary | ICD-10-CM

## 2011-10-23 DIAGNOSIS — Z170 Estrogen receptor positive status [ER+]: Secondary | ICD-10-CM

## 2011-10-23 DIAGNOSIS — C50119 Malignant neoplasm of central portion of unspecified female breast: Secondary | ICD-10-CM

## 2011-10-23 NOTE — Telephone Encounter (Signed)
Spoke to Ms. Mimbs, PO appt given for 11/07/11 @ 11:30 w/Dr. Johna Sheriff.

## 2011-10-23 NOTE — Telephone Encounter (Signed)
gave patient appointment for 11-2011 printed out calendar and gave to the patient 

## 2011-10-28 NOTE — Progress Notes (Signed)
OFFICE PROGRESS NOTE  CC  Dr. Glenna Fellows Dr. Algie Coffer Dr. Chipper Herb   DIAGNOSIS:  70 yo with new diagnosis of invasive ductal carcinoma of the right breast. 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 T2mic,N0,M0  PRIOR THERAPY: 1. S/P lumpectomy on 10/14/11 for right IDC measuring 0.11 cm ER+98%, PR+85%, Her2Neu negative, ki-67 11% (T29mic, N0)  2. Genetic counseling and testing performed on 11/27 for the comprehensive BRCA1 and BRCA2 mutation negative  CURRENT THERAPY:being seen for consideration of adjuvant anti-estrogen therapy and post lumpectomy radiation  INTERVAL HISTORY: Krista Dunn 70 y.o. female returns for follow up visit. Overall she is doing great. She has healed very nicely and is quite pleased with her cosmetic results. She denies any fevers, chills, nausea or vomiting. No pain or weakness. Her surgical scar is healing well. Remainder of the 10 point review of systems is negative.  MEDICAL HISTORY: Past Medical History  Diagnosis Date  . Breast cancer 09/13/2011  . Neck pain   . Liver hemangioma     been follow on CT scan since 2008  . Arthritis     back  . Complication of anesthesia     hard to wake up-goes out fast    ALLERGIES:  is allergic to epinephrine and procaine hcl.  MEDICATIONS:  Current Outpatient Prescriptions  Medication Sig Dispense Refill  . ciprofloxacin (CIPRO) 500 MG tablet Take 500 mg by mouth 2 (two) times daily. Started Monday 10/21/11      . CALCIUM & MAGNESIUM CARBONATES PO Take 1,500 mg by mouth daily.        . cholecalciferol (VITAMIN D) 400 UNITS TABS Take 400 Units by mouth 4 (four) times daily.        . diclofenac (VOLTAREN) 75 MG EC tablet Take 75 mg by mouth 2 (two) times daily.        Marland Kitchen HYDROcodone-acetaminophen (VICODIN) 5-500 MG per tablet Take 1 tablet by mouth as needed.        . Multiple Vitamins-Minerals (MULTIVITAMIN WITH MINERALS)  tablet Take 1 tablet by mouth daily.        . Red Yeast Rice 600 MG CAPS Take 600 mg by mouth 2 (two) times daily.          SURGICAL HISTORY:  Past Surgical History  Procedure Date  . Abdominal hysterectomy   . Right foot surgery     toe implant  . Colonoscopy   . Breast surgery 1988    rt br bx-negative  . Lymph node biopsy 10/14/2011    Procedure: LYMPH NODE BIOPSY;  Surgeon: Mariella Saa, MD;  Location: [facility];  Service: General;  Laterality: Right;    REVIEW OF SYSTEMS:  Constitutional: negative Eyes: negative Ears, nose, mouth, throat, and face: negative Respiratory: negative Cardiovascular: negative Gastrointestinal: negative Genitourinary:negative Integument/breast: positive for breast tenderness and surgical scar Hematologic/lymphatic: negative Musculoskeletal:negative Neurological: negative   PHYSICAL EXAMINATION: General appearance: alert, cooperative, no distress and appears younger than stated age Head: Normocephalic, without obvious abnormality, atraumatic Neck: no adenopathy, no carotid bruit, no JVD, supple, symmetrical, trachea midline and thyroid not enlarged, symmetric, no tenderness/mass/nodules Lymph nodes: Cervical, supraclavicular, and axillary nodes normal. Resp: clear to auscultation bilaterally and normal percussion bilaterally Back: symmetric, no curvature. ROM normal. No CVA tenderness. Cardio: regular rate and rhythm, S1, S2 normal, no murmur, click, rub or gallop and normal apical impulse GI: soft, non-tender; bowel sounds  normal; no masses,  no organomegaly Extremities: extremities normal, atraumatic, no cyanosis or edema Neurologic: Alert and oriented X 3, normal strength and tone. Normal symmetric reflexes. Normal coordination and gait Bilateral Breast Examination: left breast no masses, nipple discharge or retraction; Right breast healing lumpectomy scar, no nipple discharge ECOG PERFORMANCE STATUS: 1 - Symptomatic but  completely ambulatory  Blood pressure 148/72, pulse 80, temperature 98.1 F (36.7 C), temperature source Oral, height 5' 5.5" (1.664 m), weight 166 lb 3.2 oz (75.388 kg).  LABORATORY DATA: Lab Results  Component Value Date   WBC 3.9 09/18/2011   HGB 14.0 10/14/2011   HCT 42.4 09/18/2011   MCV 93.6 09/18/2011   PLT 284 09/18/2011      Chemistry      Component Value Date/Time   NA 138 09/18/2011 0913   K 4.2 09/18/2011 0913   CL 101 09/18/2011 0913   CO2 27 09/18/2011 0913   BUN 13 09/18/2011 0913   CREATININE 0.72 09/18/2011 0913      Component Value Date/Time   CALCIUM 10.1 09/18/2011 0913   ALKPHOS 112 09/18/2011 0913   AST 33 09/18/2011 0913   ALT 33 09/18/2011 0913   BILITOT 0.9 09/18/2011 0913       RADIOGRAPHIC STUDIES:  Nm Sentinel Node Inj-no Rpt (breast)  10/14/2011  CLINICAL DATA: right breast mass   Sulfur colloid was injected intradermally by the nuclear medicine  technologist for breast cancer sentinel node localization.      ASSESSMENT: 70 year old female with new diagnosis of stage I invasive ductal carcinoma, Er+PR+ Her2 Neu negative. S/P lumpectomy of right breast, now seen for adjuvant therapy.   PLAN:  1. Refer to radiation Oncology for post lumpectomy radiation.  2. After radiation I will plan on adjuvant anti-estrogen therapy with one of the Aromatase inhibitors such as arimidex. Risks and benefits of Arimidex therapy discussed with patient.  3. Rational for adjuvant treatment was discussed with patient for a while since she is a little reluctant about taking anything as she is concerned that it will interfere with her quality of life.   All questions were answered. The patient knows to call the clinic with any problems, questions or concerns. We can certainly see the patient much sooner if necessary.  I spent 20 minutes counseling the patient face to face. The total time spent in the appointment was 30 minutes.    Drue Second, MD Medical/Oncology [facility] [phone] (beeper) [phone] (Office)  10/28/2011, 8:25 AM

## 2011-11-07 ENCOUNTER — Encounter (INDEPENDENT_AMBULATORY_CARE_PROVIDER_SITE_OTHER): Payer: Medicare Other | Admitting: General Surgery

## 2011-11-13 ENCOUNTER — Encounter: Payer: Self-pay | Admitting: Radiation Oncology

## 2011-11-13 NOTE — Progress Notes (Signed)
71 year old female. Patient has been on hormone replacement therapy for over 15 years after hysterectomy.  10/24/10 annual screening mammogram suggested abnormality. Six month follow up mammogram  Revealed new, loosely clustered micro calcifications seen superiorly in the right breast. 09/10/11 biopsy revealed invasive ductal carcinoma along with DCIS with calcifications ER and PR positive with a proliferation marker of 11%.S/P 10/14/11 lumpectomy right IDC measure 0.11 cm HER-2 negative. Genetic counseling done 11/27 negative. Dr Milta Deiters 10/29/11 recommends post lumpectomy radiation followed by adjuvant anti estrogen therapy such as Arimidex.   Follow up new consult with Dr. Dayton Scrape 11/13/2010 Allergic to erythromycin, epinephrine, and procaine  No hx of XRT in the past No indication of pacemaker.

## 2011-11-14 ENCOUNTER — Encounter: Payer: Self-pay | Admitting: Radiation Oncology

## 2011-11-14 ENCOUNTER — Ambulatory Visit
Admission: RE | Admit: 2011-11-14 | Discharge: 2011-11-14 | Disposition: A | Discharge status: Home / Self Care | Payer: Medicare Other | Source: Ambulatory Visit | Attending: Radiation Oncology | Admitting: Radiation Oncology

## 2011-11-14 DIAGNOSIS — C50919 Malignant neoplasm of unspecified site of unspecified female breast: Secondary | ICD-10-CM

## 2011-11-14 NOTE — Progress Notes (Signed)
Patient presents to the clinic today unaccompanied for new consult with Dr. Dayton Scrape. Patient seen in breast clinic already by Dr. Dayton Scrape. Patient is alert and oriented to person, place, and time. No distress noted. Steady gait noted. Flat affect noted. Patient states, "I have a lot of distress but, I don't want to see a Child psychotherapist." Complete PATIENT MEASURE OF DISTRESS WORKSHEET with a score of 8 turned into social work. Patient reports that she has not started taking her Effexor yet. Patient reports constant sore left arm pain 6 on a scale of 0-10. Also, patient reports limited ROM of this arm specifically above her head.

## 2011-11-14 NOTE — Progress Notes (Signed)
Please see the Nurse Progress Note in the MD Initial Consult Encounter for this patient. 

## 2011-11-14 NOTE — Progress Notes (Signed)
Completed NUTRITION RISK SCREEN worksheet turned into Zenovia Jarred, RD.

## 2011-11-15 ENCOUNTER — Telehealth: Payer: Self-pay | Admitting: Radiation Oncology

## 2011-11-15 NOTE — Telephone Encounter (Signed)
Received phone call from Tiger, RT RT that she called this patient to inform her of 11/28/2011 2 pm CT/SIM. Eileen Stanford says that the patient verbalized Dr. Dayton Scrape needs to put an order in for her mammogram she has scheduled at [facility] on 11/19/2011 at 1:45pm. Without documentation this was Dr. Rennie Plowman intention this writer plans to follow up with him reference this matter once he returns. Phoned patient informing her of these intentions and she verbalized understanding. Routing this message to Dr. Dayton Scrape.

## 2011-11-18 ENCOUNTER — Telehealth: Payer: Self-pay | Admitting: Radiation Oncology

## 2011-11-18 NOTE — Telephone Encounter (Signed)
Phoned home number listed in demographics to inform patient the order has been placed for her mammogram tomorrow at [facility]. Encouraged patient to call with further needs.

## 2011-11-19 ENCOUNTER — Encounter: Payer: Self-pay | Admitting: *Deleted

## 2011-11-19 ENCOUNTER — Encounter: Payer: Self-pay | Admitting: Radiation Oncology

## 2011-11-19 NOTE — Progress Notes (Signed)
[facility] Psychosocial Distress Screening Clinical Social Work  Clinical Social Work was referred by distress screening protocol. The patient scored a 8 on the Psychosocial Distress Thermometer which indicates severe distress. The patient expressed to RN Malva Cogan that she would not like to meet with a Child psychotherapist. Clinical Social Work unable to assess for distress and other psychosocial needs.  Kathrin Penner, MSW, [facility] Clinical Social Worker [facility] [phone]

## 2011-11-19 NOTE — Progress Notes (Signed)
Krista Dunn had a diagnostic right breast mammogram earlier today at [facility].  Although there were scattered microcalcifications noted within the right breast, there were no calcifications seen in the region of the lumpectomy tumor bed.    ______________________________ Maryln Gottron, M.D. RJM/MEDQ  D:  11/19/2011  T:  11/19/2011  Job:  [id number]

## 2011-11-21 ENCOUNTER — Encounter (INDEPENDENT_AMBULATORY_CARE_PROVIDER_SITE_OTHER): Payer: Self-pay | Admitting: General Surgery

## 2011-11-25 ENCOUNTER — Ambulatory Visit (HOSPITAL_BASED_OUTPATIENT_CLINIC_OR_DEPARTMENT_OTHER): Payer: Medicare Other | Admitting: Oncology

## 2011-11-25 ENCOUNTER — Other Ambulatory Visit: Payer: Medicare Other | Admitting: Lab

## 2011-11-25 DIAGNOSIS — C50919 Malignant neoplasm of unspecified site of unspecified female breast: Secondary | ICD-10-CM

## 2011-11-25 LAB — COMPREHENSIVE METABOLIC PANEL
AST: 28 U/L (ref 0–37)
Albumin: 4.7 g/dL (ref 3.5–5.2)
Alkaline Phosphatase: 96 U/L (ref 39–117)
BUN: 15 mg/dL (ref 6–23)
Potassium: 4.7 mEq/L (ref 3.5–5.3)
Sodium: 140 mEq/L (ref 135–145)
Total Bilirubin: 1 mg/dL (ref 0.3–1.2)

## 2011-11-25 LAB — CBC WITH DIFFERENTIAL/PLATELET
Basophils Absolute: 0.1 10*3/uL (ref 0.0–0.1)
EOS%: 2.3 % (ref 0.0–7.0)
LYMPH%: 26.2 % (ref 14.0–49.7)
MCH: 31.2 pg (ref 25.1–34.0)
MCV: 92.8 fL (ref 79.5–101.0)
MONO%: 6.5 % (ref 0.0–14.0)
Platelets: 279 10*3/uL (ref 145–400)
RBC: 4.54 10*6/uL (ref 3.70–5.45)
RDW: 14.1 % (ref 11.2–14.5)

## 2011-11-25 NOTE — Progress Notes (Signed)
OFFICE PROGRESS NOTE  CC  Dr. Glenna Fellows Dr. Algie Coffer Dr. Chipper Herb   DIAGNOSIS:  71 yo with new diagnosis of invasive ductal carcinoma of the right breast. 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 T39mic,N0,M0  PRIOR THERAPY: 1. S/P lumpectomy on 10/14/11 for right IDC measuring 0.11 cm ER+98%, PR+85%, Her2Neu negative, ki-67 11% (T75mic, N0)  2. Genetic counseling and testing performed on 11/27 for the comprehensive BRCA1 and BRCA2 mutation negative  CURRENT THERAPY:being seen for consideration of adjuvant anti-estrogen therapy and post lumpectomy radiation  INTERVAL HISTORY: Krista Dunn 71 y.o. female returns for follow up visit. Overall she is doing great. Patient met with Dr. Chipper Herb. She is planning on getting radiation therapy to the right breast. She will be simulated next week with hopefully starting her radiation of the following week. Today she feels well she has no fevers chills night sweats headaches shortness of breath chest pains palpitations no myalgias or arthralgias. She has no breast swelling or tenderness or pain. Remainder of the 10 point review of systems is negative.  MEDICAL HISTORY: Past Medical History  Diagnosis Date  . Breast cancer 09/13/2011  . Neck pain   . Liver hemangioma     been follow on CT scan since 2008  . Arthritis     back  . Complication of anesthesia     hard to wake up-goes out fast    ALLERGIES:  is allergic to epinephrine and procaine hcl.  MEDICATIONS:  Current Outpatient Prescriptions  Medication Sig Dispense Refill  . CALCIUM & MAGNESIUM CARBONATES PO Take 1,500 mg by mouth daily.        . cholecalciferol (VITAMIN D) 400 UNITS TABS Take 400 Units by mouth 4 (four) times daily.        . diclofenac (VOLTAREN) 75 MG EC tablet Take 75 mg by mouth 2 (two) times daily.        Marland Kitchen HYDROcodone-acetaminophen (VICODIN) 5-500 MG per tablet Take 1  tablet by mouth as needed.        . Multiple Vitamins-Minerals (MULTIVITAMIN WITH MINERALS) tablet Take 1 tablet by mouth daily.        . Vitamin D, Ergocalciferol, (DRISDOL) 50000 UNITS CAPS       . Red Yeast Rice 600 MG CAPS Take 600 mg by mouth 2 (two) times daily.        Marland Kitchen venlafaxine (EFFEXOR-XR) 37.5 MG 24 hr capsule         SURGICAL HISTORY:  Past Surgical History  Procedure Date  . Right foot surgery     toe implant  . Colonoscopy   . Breast surgery 1988    rt br bx-negative  . Lymph node biopsy 10/14/2011    Procedure: LYMPH NODE BIOPSY;  Surgeon: Mariella Saa, MD;  Location: [facility];  Service: General;  Laterality: Right;  . Abdominal hysterectomy     REVIEW OF SYSTEMS:  Constitutional: negative Eyes: negative Ears, nose, mouth, throat, and face: negative Respiratory: negative Cardiovascular: negative Gastrointestinal: negative Genitourinary:negative Integument/breast: positive for breast tenderness and surgical scar Hematologic/lymphatic: negative Musculoskeletal:negative Neurological: negative   PHYSICAL EXAMINATION: General appearance: alert, cooperative, no distress and appears younger than stated age Head: Normocephalic, without obvious abnormality, atraumatic Neck: no adenopathy, no carotid bruit, no JVD, supple, symmetrical, trachea midline and thyroid not enlarged, symmetric, no tenderness/mass/nodules Lymph nodes: Cervical, supraclavicular, and axillary nodes normal. Resp: clear to auscultation  bilaterally and normal percussion bilaterally Back: symmetric, no curvature. ROM normal. No CVA tenderness. Cardio: regular rate and rhythm, S1, S2 normal, no murmur, click, rub or gallop and normal apical impulse GI: soft, non-tender; bowel sounds normal; no masses,  no organomegaly Extremities: extremities normal, atraumatic, no cyanosis or edema Neurologic: Alert and oriented X 3, normal strength and tone. Normal symmetric reflexes. Normal  coordination and gait Bilateral Breast Examination: left breast no masses, nipple discharge or retraction; Right breast healing lumpectomy scar, no nipple discharge ECOG PERFORMANCE STATUS: 1 - Symptomatic but completely ambulatory  Blood pressure 141/80, pulse 67, temperature 98.2 F (36.8 C), temperature source Oral, height 5\' 5"  (1.651 m), weight 168 lb 9.6 oz (76.476 kg).  LABORATORY DATA: Lab Results  Component Value Date   WBC 5.7 11/25/2011   HGB 14.1 11/25/2011   HCT 42.1 11/25/2011   MCV 92.8 11/25/2011   PLT 279 11/25/2011      Chemistry      Component Value Date/Time   NA 138 09/18/2011 0913   K 4.2 09/18/2011 0913   CL 101 09/18/2011 0913   CO2 27 09/18/2011 0913   BUN 13 09/18/2011 0913   CREATININE 0.72 09/18/2011 0913      Component Value Date/Time   CALCIUM 10.1 09/18/2011 0913   ALKPHOS 112 09/18/2011 0913   AST 33 09/18/2011 0913   ALT 33 09/18/2011 0913   BILITOT 0.9 09/18/2011 0913       RADIOGRAPHIC STUDIES:  Nm Sentinel Node Inj-no Rpt (breast)  10/14/2011  CLINICAL DATA: right breast mass   Sulfur colloid was injected intradermally by the nuclear medicine  technologist for breast cancer sentinel node localization.      ASSESSMENT: 71 year old female with new diagnosis of stage I invasive ductal carcinoma, Er+PR+ Her2 Neu negative. S/P lumpectomy of right breast, now seen for adjuvant therapy.   PLAN: patient was seen by Dr. Chipper Herb. He has recommended radiation therapy to the right breast. Patient now agrees. She will hopefully begin the radiation in the next 2 weeks or so. Once she completes radiation therapy then we will plan on giving her antiestrogen therapy with Arimidex 1 mg daily. All questions were discussed answered today. She will be seen after she completes radiation therapy. Patient knows to call me with any problems questions or concerns in the meantime.  All questions were answered. The patient knows to call the clinic with any problems,  questions or concerns. We can certainly see the patient much sooner if necessary.  I spent 20 minutes counseling the patient face to face. The total time spent in the appointment was 30 minutes.    Drue Second, MD Medical/Oncology [facility] [phone] (beeper) [phone] (Office)  11/25/2011, 3:35 PM

## 2011-11-28 ENCOUNTER — Ambulatory Visit
Admission: RE | Admit: 2011-11-28 | Discharge: 2011-11-28 | Disposition: A | Discharge status: Home / Self Care | Payer: Medicare Other | Source: Ambulatory Visit | Attending: Radiation Oncology | Admitting: Radiation Oncology

## 2011-11-28 DIAGNOSIS — C50919 Malignant neoplasm of unspecified site of unspecified female breast: Secondary | ICD-10-CM

## 2011-11-28 NOTE — Progress Notes (Signed)
Met with patient to discuss RO billing.  Patient had no concerns today. 

## 2011-11-28 NOTE — Progress Notes (Signed)
Simulation/treatment planning note:  The patient was taken to the CT simulator. She was placed prone on the prone breast immobilization device. Her right breast mastectomy scar was marked with a radiopaque wire. She was then scanned. I contoured her tumor bed. Dosimetry contoured remaining normal structures. She was set up to tangential fields. 2 separate multileaf collimators were designed to shield normal shredding structures. I  am prescribing 4500 cGy in 25 sessions utilizing mixed energies to be followed by electron beam boost in the supine position for a further 1000 cGy in 5 sessions. An isodose plan is requested. Dosimetry is also requested.

## 2011-11-29 ENCOUNTER — Encounter: Payer: Self-pay | Admitting: Radiation Oncology

## 2011-11-29 NOTE — Progress Notes (Signed)
Followup note:  This is a redictation of my followup visit with the patient on 11/14/2011.  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 At the time of a screening mammogram on 10/24/2010 [facility]) she was noted to have multiple microcalcifications within the lower inner quadrant of the right breast. A six-month followup mammogram was recommended. She return for repeat mammography on 09/04/2011. There were new, loosely clustered microcalcifications seen superiorly in the right breast with no associated mass. The previously noted calcifications inferiorly remained stable. A stereotactic core biopsy on 09/10/2011 revealed invasive ductal carcinoma along with DCIS with calcifications. The biopsy was at 1 to 2:00. For the invasive portion she was strongly ER/PR positive with a proliferation marker of 11%. She was presented at the breast conference this morning and Dr. Colonel Bald felt that the invasive ductal carcinoma was rather focal. Of note is that she has been on hormone replacement therapy for over 15 years.  She went on  to have a right partial mastectomy and sentinel lymph node biopsy with Dr. Johna Sheriff on 10/14/2011. She is found to have invasive ductal carcinoma spanning 0.11 cm and DCIS with calcifications. The margins appear to be negative. An additional right inferior lateral margin contained a 0.8 cm a focus of invasive ductal carcinoma. There was no evidence for metastatic disease in the solitary sentinel lymph node. Again, her primary tumor was strongly ER/PR positive. She was seen by Dr. Welton Flakes who recommends post radiation therapy adjuvant hormone therapy.  Physical examination: Head and neck examination grossly unremarkable. Nodes: Without palpable cervical or supraclavicular lymphadenopathy. Chest: Lungs clear. Breasts: There is a poor cystectomy wound along the upper inner quadrant of the right  breast extending from 2 to 3:00. No dominant masses are appreciated. Left breast that masses or lesions.  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 I recommend a post operative mammogram to confirm removal of all suspicious microcalcifications (done on 11/19/2011) We discussed the potential daily toxicities of radiation therapy and consent was signed. She was scheduled for simulation/treatment planning on 11/28/2011.  30 minutes was spent face-to-face with the patient, primarily counseling the patient and coordinating her care. Marland Kitchen

## 2011-12-05 ENCOUNTER — Ambulatory Visit
Admission: RE | Admit: 2011-12-05 | Discharge: 2011-12-05 | Disposition: A | Discharge status: Home / Self Care | Payer: Medicare Other | Source: Ambulatory Visit | Attending: Radiation Oncology | Admitting: Radiation Oncology

## 2011-12-05 ENCOUNTER — Encounter: Payer: Self-pay | Admitting: Radiation Oncology

## 2011-12-05 NOTE — Progress Notes (Signed)
Simulation verification note:  The patient underwent simulation verification for tangential prone treatment to her right breast. Her isocenter is in good position and the multileaf collimators contoured the treatment volume appropriately.

## 2011-12-09 ENCOUNTER — Ambulatory Visit
Admission: RE | Admit: 2011-12-09 | Discharge: 2011-12-09 | Disposition: A | Discharge status: Home / Self Care | Payer: Medicare Other | Source: Ambulatory Visit | Attending: Radiation Oncology | Admitting: Radiation Oncology

## 2011-12-10 ENCOUNTER — Ambulatory Visit
Admission: RE | Admit: 2011-12-10 | Discharge: 2011-12-10 | Disposition: A | Discharge status: Home / Self Care | Payer: Medicare Other | Source: Ambulatory Visit | Attending: Radiation Oncology | Admitting: Radiation Oncology

## 2011-12-10 ENCOUNTER — Encounter: Payer: Self-pay | Admitting: Radiation Oncology

## 2011-12-10 DIAGNOSIS — C50919 Malignant neoplasm of unspecified site of unspecified female breast: Secondary | ICD-10-CM

## 2011-12-10 MED ORDER — RADIAPLEXRX EX GEL
Freq: Once | CUTANEOUS | Status: AC
  Administered 2011-12-10: 16:00:00 via TOPICAL

## 2011-12-10 MED ORDER — ALRA NON-METALLIC DEODORANT (RAD-ONC)
1.0000 "application " | Freq: Once | TOPICAL | Status: AC
  Administered 2011-12-10: 1 via TOPICAL

## 2011-12-10 NOTE — Progress Notes (Signed)
Weekly Management Note:  Site:R Breast Current Dose:  360  cGy Projected Dose: 5500  cGy  Narrative: The patient is seen today for routine under treatment assessment. CBCT/MVCT images/port films were reviewed. The chart was reviewed.   She is without complaints today although she does report some dryness of her skin along her upper chest and along the right nipple areola. She has Radioplex gel to use when necessary.  Physical Examination: There were no vitals filed for this visit..  Weight:  . There are no significant skin changes but there is subtle erythema surrounding the right areola, presumably from her sentinel lymph node isotope injection.  Impression: Tolerating radiation therapy well. She probably has a contact dermatitis from her isotope injection from her sentinel lymph node biopsy. She may use hydrocortisone cream when necessary.  Plan: Continue radiation therapy as planned.

## 2011-12-10 NOTE — Progress Notes (Signed)
Patient presented to the clinic today following treatment for under treat visit with Dr. Dayton Scrape and post sim education with Lelon Mast, RN. Patient is alert and oriented to person, place, and time. No distress noted. Steady gait noted. Pleasant affect noted. Patient denies pain at this time. Oriented patient to staff and routine of the clinic. Provided patient with RADIATION THERAPY AND YOU handbook and reviewed pertinent information. Provide patient with Radiaplex and Alra then, reviewed use. Also, provided patient with this writers business card and encouraged her to phone with needs. Patient verbalized understanding of all things reviewed. Patient has no complaints at this time. Patient denies nausea, vomiting, diarrhea or headache. Patient refused to be weighed today. Reported all findings to Dr. Dayton Scrape.

## 2011-12-11 ENCOUNTER — Ambulatory Visit
Admission: RE | Admit: 2011-12-11 | Discharge: 2011-12-11 | Disposition: A | Discharge status: Home / Self Care | Payer: Medicare Other | Source: Ambulatory Visit | Attending: Radiation Oncology | Admitting: Radiation Oncology

## 2011-12-11 ENCOUNTER — Ambulatory Visit: Payer: Medicare Other

## 2011-12-12 ENCOUNTER — Ambulatory Visit
Admission: RE | Admit: 2011-12-12 | Discharge: 2011-12-12 | Disposition: A | Discharge status: Home / Self Care | Payer: Medicare Other | Source: Ambulatory Visit | Attending: Radiation Oncology | Admitting: Radiation Oncology

## 2011-12-13 ENCOUNTER — Ambulatory Visit
Admission: RE | Admit: 2011-12-13 | Discharge: 2011-12-13 | Disposition: A | Discharge status: Home / Self Care | Payer: Medicare Other | Source: Ambulatory Visit | Attending: Radiation Oncology | Admitting: Radiation Oncology

## 2011-12-16 ENCOUNTER — Telehealth: Payer: Self-pay | Admitting: Radiation Oncology

## 2011-12-16 ENCOUNTER — Ambulatory Visit: Payer: Medicare Other

## 2011-12-16 NOTE — Telephone Encounter (Signed)
Patient phoned into the treatment machine and cancelled today's appointment. Phoned patient at home number listed in demographics to check her status. Patient verbalized she is suffering from a bad cold, cough, and headache. Patient states,"I was in bed all day yesterday with this stuff." Patient reports that she will try and make tomorrow's appointment. Encouraged patient to allow time after tomorrow's treatment to see Dr. Dayton Scrape for under treat visit. Patient verbalized understanding.

## 2011-12-17 ENCOUNTER — Encounter: Payer: Self-pay | Admitting: *Deleted

## 2011-12-17 ENCOUNTER — Ambulatory Visit
Admission: RE | Admit: 2011-12-17 | Discharge: 2011-12-17 | Disposition: A | Discharge status: Home / Self Care | Payer: Medicare Other | Source: Ambulatory Visit | Attending: Radiation Oncology | Admitting: Radiation Oncology

## 2011-12-17 ENCOUNTER — Encounter: Payer: Self-pay | Admitting: Radiation Oncology

## 2011-12-17 VITALS — BP 125/82 | HR 81 | Temp 98.0°F | Resp 20 | Wt 169.3 lb

## 2011-12-17 DIAGNOSIS — C50919 Malignant neoplasm of unspecified site of unspecified female breast: Secondary | ICD-10-CM | POA: Insufficient documentation

## 2011-12-17 NOTE — Progress Notes (Signed)
Pt missed yesterday,  stated"i have a cold/ dry, coughing, pheglm, since Saturday, was in bed Sunday headache,sore throat, headache gone today, very slight erythema on right breast, occasional shooting pains right side of breast, pt taking alternating robitussin and nyquil, took vitals, no fever, 3:50 PM

## 2011-12-17 NOTE — Progress Notes (Signed)
Weekly Management Note:  Site:R Breast Current Dose:  1080  cGy Projected Dose: 5500  cGy  Narrative: The patient is seen today for routine under treatment assessment. CBCT/MVCT images/port films were reviewed. The chart was reviewed.   She missed yesterday's treatment because of a URI. No fever. She uses Radioplex gel when necessary.  Physical Examination:  Filed Vitals:   12/17/11 1546  BP: 125/82  Pulse: 81  Temp: 98 F (36.7 C)  Resp: 20  .  Weight: 169 lb 4.8 oz (76.794 kg). Faint erythema the skin with no areas of desquamation along the right breast.  Impression: Tolerating radiation therapy well.  Plan: Continue radiation therapy as planned.

## 2011-12-18 ENCOUNTER — Ambulatory Visit
Admission: RE | Admit: 2011-12-18 | Discharge: 2011-12-18 | Disposition: A | Discharge status: Home / Self Care | Payer: Medicare Other | Source: Ambulatory Visit | Attending: Radiation Oncology | Admitting: Radiation Oncology

## 2011-12-19 ENCOUNTER — Ambulatory Visit
Admission: RE | Admit: 2011-12-19 | Discharge: 2011-12-19 | Disposition: A | Discharge status: Home / Self Care | Payer: Medicare Other | Source: Ambulatory Visit | Attending: Radiation Oncology | Admitting: Radiation Oncology

## 2011-12-20 ENCOUNTER — Ambulatory Visit: Payer: Medicare Other

## 2011-12-20 ENCOUNTER — Ambulatory Visit
Admission: RE | Admit: 2011-12-20 | Discharge: 2011-12-20 | Disposition: A | Discharge status: Home / Self Care | Payer: Medicare Other | Source: Ambulatory Visit | Attending: Radiation Oncology | Admitting: Radiation Oncology

## 2011-12-20 NOTE — Progress Notes (Signed)
saw pt for blood draw for [facility].  Already neg for BRCA 1/2+BART

## 2011-12-23 ENCOUNTER — Encounter: Payer: Self-pay | Admitting: Radiation Oncology

## 2011-12-23 ENCOUNTER — Ambulatory Visit
Admission: RE | Admit: 2011-12-23 | Discharge: 2011-12-23 | Disposition: A | Discharge status: Home / Self Care | Payer: Medicare Other | Source: Ambulatory Visit | Attending: Radiation Oncology | Admitting: Radiation Oncology

## 2011-12-23 VITALS — BP 121/86 | HR 70 | Resp 18 | Wt 170.2 lb

## 2011-12-23 DIAGNOSIS — C50919 Malignant neoplasm of unspecified site of unspecified female breast: Secondary | ICD-10-CM

## 2011-12-23 NOTE — Progress Notes (Signed)
Weekly Management Note:  Site:R Breast Current Dose:  1800  cGy Projected Dose: 5500  cGy  Narrative: The patient is seen today for routine under treatment assessment. CBCT/MVCT images/port films were reviewed. The chart was reviewed.   She developed a pruritic rash behind both ears over the weekend. She was to know if this is related to her radiation therapy. She is not using any new perfume, jewelry/earrings or lotions. She does have Radioplex gel to use when necessary.  Physical Examination:  Filed Vitals:   12/23/11 1540  BP: 121/86  Pulse: 70  Resp: 18  .  Weight: 170 lb 3.2 oz (77.202 kg). There is appears to be a contact dermatitis behind both ears, left greater than right. On inspection of the right breast there is mild erythema with no areas of desquamation.  Impression: Tolerating radiation therapy well. She appears to have a contact dermatitis behind both ears, on related to her radiation therapy. She may use hydrocortisone cream when necessary.  Plan: Continue radiation therapy as planned.

## 2011-12-23 NOTE — Progress Notes (Signed)
Patient presents to the clinic today unaccompanied for under treat visit with Dr. Dayton Scrape. Patient is alert and oriented to person, place, and time. No distress noted. Steady gait noted. Pleasant affect noted. Patient denies pain at this time. Patient denies hyperpigmentation of the right/treated breast. Patient reports using Radiaplex as prescribed. Patient reports that over the weekend she got an itchy rash behind both ears and wondered if it was related to radiation. Educated patient that symptoms related to radiation therapy were localized to the area being treated. Encouraged patient to apply benadryl or cortisone topically to these areas should they reappear. Informed Dr. Dayton Scrape of these findings.

## 2011-12-24 ENCOUNTER — Ambulatory Visit
Admission: RE | Admit: 2011-12-24 | Discharge: 2011-12-24 | Disposition: A | Discharge status: Home / Self Care | Payer: Medicare Other | Source: Ambulatory Visit | Attending: Radiation Oncology | Admitting: Radiation Oncology

## 2011-12-25 ENCOUNTER — Ambulatory Visit
Admission: RE | Admit: 2011-12-25 | Discharge: 2011-12-25 | Disposition: A | Discharge status: Home / Self Care | Payer: Medicare Other | Source: Ambulatory Visit | Attending: Radiation Oncology | Admitting: Radiation Oncology

## 2011-12-25 NOTE — Progress Notes (Signed)
Pt in nursing after xrt today w/unrelieved L shoulder pain. She states this is ongoing x 2 weeks. Per Dr Charlett Blake she had Cortisone injection approx 1 week ago w/good relief. She states Hydrocodone 1/2 tab gives no relief. She cannot tol larger dose due to n/v. Pt feels that her positioning for tx is causing her pain. Advised she try Ibuprofen and ice pack to L shoulder; she states Dr Charlett Blake advised ice, and she has used w/good temporary relief.  She states she has "patches w/medication she used on foot in past". She is asking if she can use these patches. Advised she call Dr Charlett Blake and ask if this is acceptable. Pt would like to see Dr Dayton Scrape; informed her dr has consults this afternoon, but will page him if she wishes. Pt states she will try ice, Ibuprofen and see dr tomorrow to discuss her pain and positioning for tx.

## 2011-12-26 ENCOUNTER — Ambulatory Visit
Admission: RE | Admit: 2011-12-26 | Discharge: 2011-12-26 | Disposition: A | Discharge status: Home / Self Care | Payer: Medicare Other | Source: Ambulatory Visit | Attending: Radiation Oncology | Admitting: Radiation Oncology

## 2011-12-26 DIAGNOSIS — C50919 Malignant neoplasm of unspecified site of unspecified female breast: Secondary | ICD-10-CM

## 2011-12-26 NOTE — Progress Notes (Signed)
Weekly Management Note:  Site:R Breast Current Dose:  2340  cGy Projected Dose: 5500  cGy  Narrative: The patient is seen today for routine under treatment assessment. CBCT/MVCT images/port films were reviewed. The chart was reviewed.   She is having worsening left shoulder pain. This has been ongoing for 2 weeks. She had a cortisone injection 1 week ago with good relief, she is having more pain along the anterior shoulder particularly when lying prone for her prone breast radiation therapy. Her pain is improved with the use of an ice pack.  Physical Examination: There were no vitals filed for this visit..  Weight:  . There is palpable discomfort along her left anterior shoulder/glenoid. There is full left shoulder extension but there is discomfort with left shoulder extension.  Impression: Tolerating radiation therapy well, however, she may have bursitis or rotator cuff problems. She will see Dr. Charlett Blake early next week for further evaluation. We'll try to continue with prone breast radiation if at all possible.  Plan: Continue radiation therapy as planned.

## 2011-12-27 ENCOUNTER — Ambulatory Visit
Admission: RE | Admit: 2011-12-27 | Discharge: 2011-12-27 | Disposition: A | Discharge status: Home / Self Care | Payer: Medicare Other | Source: Ambulatory Visit | Attending: Radiation Oncology | Admitting: Radiation Oncology

## 2011-12-30 ENCOUNTER — Ambulatory Visit
Admission: RE | Admit: 2011-12-30 | Discharge: 2011-12-30 | Disposition: A | Discharge status: Home / Self Care | Payer: Medicare Other | Source: Ambulatory Visit | Attending: Radiation Oncology | Admitting: Radiation Oncology

## 2011-12-30 DIAGNOSIS — C50919 Malignant neoplasm of unspecified site of unspecified female breast: Secondary | ICD-10-CM

## 2011-12-30 NOTE — Progress Notes (Signed)
Simulation note:  A custom neck mold was constructed on a custom breast board for immobilization in the supine position. Her right partial mastectomy scar was marked with a radiopaque wire. Her isocenter for her electron beam boost was placed on the skin surface overlying her tumor bed. I contoured her tumor bed. She is now ready for electron beam simulation. I prescribing 1000 cGy in 5 sessions utilizing 18 MEV electrons. A special port plan is requested. One custom block will be constructed to shield the normal surrounding structures.

## 2011-12-31 ENCOUNTER — Ambulatory Visit
Admission: RE | Admit: 2011-12-31 | Discharge: 2011-12-31 | Disposition: A | Discharge status: Home / Self Care | Payer: Medicare Other | Source: Ambulatory Visit | Attending: Radiation Oncology | Admitting: Radiation Oncology

## 2012-01-01 ENCOUNTER — Ambulatory Visit
Admission: RE | Admit: 2012-01-01 | Discharge: 2012-01-01 | Disposition: A | Discharge status: Home / Self Care | Payer: Medicare Other | Source: Ambulatory Visit | Attending: Radiation Oncology | Admitting: Radiation Oncology

## 2012-01-02 ENCOUNTER — Ambulatory Visit
Admission: RE | Admit: 2012-01-02 | Discharge: 2012-01-02 | Disposition: A | Discharge status: Home / Self Care | Payer: Medicare Other | Source: Ambulatory Visit | Attending: Radiation Oncology | Admitting: Radiation Oncology

## 2012-01-03 ENCOUNTER — Ambulatory Visit
Admission: RE | Admit: 2012-01-03 | Discharge: 2012-01-03 | Disposition: A | Discharge status: Home / Self Care | Payer: Medicare Other | Source: Ambulatory Visit | Attending: Radiation Oncology | Admitting: Radiation Oncology

## 2012-01-06 ENCOUNTER — Ambulatory Visit: Payer: Medicare Other

## 2012-01-07 ENCOUNTER — Ambulatory Visit
Admission: RE | Admit: 2012-01-07 | Discharge: 2012-01-07 | Disposition: A | Discharge status: Home / Self Care | Payer: Medicare Other | Source: Ambulatory Visit | Attending: Radiation Oncology | Admitting: Radiation Oncology

## 2012-01-07 ENCOUNTER — Encounter: Payer: Self-pay | Admitting: Radiation Oncology

## 2012-01-07 VITALS — BP 128/77 | HR 64 | Resp 18

## 2012-01-07 DIAGNOSIS — C50919 Malignant neoplasm of unspecified site of unspecified female breast: Secondary | ICD-10-CM

## 2012-01-07 NOTE — Progress Notes (Signed)
Patient presents to the clinic today accompanied by her husband for an under treat visit with Dr. Dayton Scrape. Patient is alert and oriented to person, place, and time. No distress noted. Steady gait noted. Pleasant affect noted. Patient denies pain at this time. Patient reports a persistent dry cough that is worse at night. Patient reports a sore throat related to the frequent coughing. Patient reports itching around the nipple of the treated breast. Encouraged patient to apply hydrocortisone to this area and radiaplex as directed. Patient reports mild tanning of treated breast. Also, patient reports frequent cramping during the night in both legs. Patient reports increasing her potassium intake as a result. Reported all findings to Dr. Dayton Scrape.

## 2012-01-07 NOTE — Progress Notes (Signed)
Weekly Management Note:  Site:R Breast Current Dose:  3600  cGy Projected Dose: 5500  cGy  Narrative: The patient is seen today for routine under treatment assessment. CBCT/MVCT images/port films were reviewed. The chart was reviewed.   She does report a sore throat and dry cough. This has been present since late last week. She denies fevers. She otherwise feels well. She continues with her Radioplex gel.  Physical Examination:  Filed Vitals:   01/07/12 1609  BP: 128/77  Pulse: 64  Resp: 18  .  Weight:  . Head and neck examination: Oropharynx unremarkable to inspection. Lungs: Clear. Right breast: There is erythema and slight hyperpigmentation of the skin along the right breast with no areas of desquamation.  Impression: Tolerating radiation therapy well. She probably has a viral URI. She is to contact her primary care physician's and she has not improved by early next week.  Plan: Continue radiation therapy as planned.

## 2012-01-08 ENCOUNTER — Ambulatory Visit
Admission: RE | Admit: 2012-01-08 | Discharge: 2012-01-08 | Disposition: A | Discharge status: Home / Self Care | Payer: Medicare Other | Source: Ambulatory Visit | Attending: Radiation Oncology | Admitting: Radiation Oncology

## 2012-01-09 ENCOUNTER — Ambulatory Visit
Admission: RE | Admit: 2012-01-09 | Discharge: 2012-01-09 | Disposition: A | Discharge status: Home / Self Care | Payer: Medicare Other | Source: Ambulatory Visit | Attending: Radiation Oncology | Admitting: Radiation Oncology

## 2012-01-09 ENCOUNTER — Encounter: Payer: Self-pay | Admitting: Radiation Oncology

## 2012-01-09 NOTE — Progress Notes (Signed)
Simulation note  The patient underwent virtual simulation for her right breast electron beam boost. She was set up en face to her right breast tumor bed. One custom block was constructed to conform the field. A special port plan is requested. I prescribing 1000 cGy in 5 sessions utilizing 18 MEV electrons. Electron beam energy was chosen based on the depth of her tumor bed as seen on her CT scan.

## 2012-01-10 ENCOUNTER — Ambulatory Visit
Admission: RE | Admit: 2012-01-10 | Discharge: 2012-01-10 | Disposition: A | Discharge status: Home / Self Care | Payer: Medicare Other | Source: Ambulatory Visit | Attending: Radiation Oncology | Admitting: Radiation Oncology

## 2012-01-13 ENCOUNTER — Encounter: Payer: Self-pay | Admitting: Radiation Oncology

## 2012-01-13 ENCOUNTER — Ambulatory Visit
Admission: RE | Admit: 2012-01-13 | Discharge: 2012-01-13 | Disposition: A | Discharge status: Home / Self Care | Payer: Medicare Other | Source: Ambulatory Visit | Attending: Radiation Oncology | Admitting: Radiation Oncology

## 2012-01-13 VITALS — BP 128/76 | HR 67 | Resp 18

## 2012-01-13 DIAGNOSIS — C50919 Malignant neoplasm of unspecified site of unspecified female breast: Secondary | ICD-10-CM

## 2012-01-13 MED ORDER — BIAFINE EX EMUL
Freq: Every day | CUTANEOUS | Status: DC
  Administered 2012-01-13: 17:00:00 via TOPICAL

## 2012-01-13 NOTE — Progress Notes (Signed)
Weekly Management Note:  Site:R Breast Current Dose:  4230  cGy Projected Dose: 5500  cGy  Narrative: The patient is seen today for routine under treatment assessment. CBCT/MVCT images/port films were reviewed. The chart was reviewed.   She is without complaints today except for more pruritus along the right breast. She has been using Radioplex gel.  Physical Examination:  Filed Vitals:   01/13/12 1613  BP: 128/76  Pulse: 67  Resp: 18  .  Weight:  . There is erythema of the breast with patchy dry desquamation along the inframammary region and axilla.  Impression: Tolerating radiation therapy well. 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 We will change to Biafine cream.  Plan: Continue radiation therapy as planned.

## 2012-01-13 NOTE — Progress Notes (Signed)
Patient presents to the clinic today for under treat visit with Dr. Dayton Scrape. Patient is alert and oriented to person, place, and time. No distress noted. Steady gait noted. Pleasant affect noted. Patient reports intermittent brief sharp shooting pain in her right breast. Patient reports an "itcy rash" between her breast for which is applies Benadryl topical cream. Patient reports the area around her nipple feels hard and raised. Patient reports that when she reaches for something with her right arm she feels a pull in her ribs. Patient reports that her cough and sore throat have resolved. Reported all findings to Dr. Dayton Scrape.

## 2012-01-14 ENCOUNTER — Ambulatory Visit
Admission: RE | Admit: 2012-01-14 | Discharge: 2012-01-14 | Disposition: A | Discharge status: Home / Self Care | Payer: Medicare Other | Source: Ambulatory Visit | Attending: Radiation Oncology | Admitting: Radiation Oncology

## 2012-01-15 ENCOUNTER — Ambulatory Visit
Admission: RE | Admit: 2012-01-15 | Discharge: 2012-01-15 | Disposition: A | Discharge status: Home / Self Care | Payer: Medicare Other | Source: Ambulatory Visit | Attending: Radiation Oncology | Admitting: Radiation Oncology

## 2012-01-16 ENCOUNTER — Ambulatory Visit
Admission: RE | Admit: 2012-01-16 | Discharge: 2012-01-16 | Disposition: A | Discharge status: Home / Self Care | Payer: Medicare Other | Source: Ambulatory Visit | Attending: Radiation Oncology | Admitting: Radiation Oncology

## 2012-01-17 ENCOUNTER — Ambulatory Visit
Admission: RE | Admit: 2012-01-17 | Discharge: 2012-01-17 | Disposition: A | Discharge status: Home / Self Care | Payer: Medicare Other | Source: Ambulatory Visit | Attending: Radiation Oncology | Admitting: Radiation Oncology

## 2012-01-20 ENCOUNTER — Ambulatory Visit
Admission: RE | Admit: 2012-01-20 | Discharge: 2012-01-20 | Disposition: A | Discharge status: Home / Self Care | Payer: Medicare Other | Source: Ambulatory Visit | Attending: Radiation Oncology | Admitting: Radiation Oncology

## 2012-01-20 ENCOUNTER — Encounter: Payer: Self-pay | Admitting: Radiation Oncology

## 2012-01-20 VITALS — BP 134/76 | HR 79 | Resp 18 | Wt 175.6 lb

## 2012-01-20 DIAGNOSIS — C50919 Malignant neoplasm of unspecified site of unspecified female breast: Secondary | ICD-10-CM

## 2012-01-20 MED ORDER — RADIAPLEXRX EX GEL
Freq: Once | CUTANEOUS | Status: AC
  Administered 2012-01-20: 16:00:00 via TOPICAL

## 2012-01-20 NOTE — Progress Notes (Signed)
Weekly Management Note:  Site:R Breast Current Dose:  5300  cGy Projected Dose: 5500  cGy  Narrative: The patient is seen today for routine under treatment assessment. CBCT/MVCT images/port films were reviewed. The chart was reviewed.   No new complaints today. She does have pruritus involving her left breast, particularly in the upper-inner quadrant. She has been using Benadryl ointment in addition to Radioplex gel.  Physical Examination:  Filed Vitals:   01/20/12 1530  BP: 134/76  Pulse: 79  Resp: 18  .  Weight: 175 lb 9.6 oz (79.652 kg). There is mild to moderate erythema along the right breast with a papular reaction along the upper inner aspect of the breast. There are no areas of moist desquamation.  Impression: Tolerating radiation therapy well. She'll finish her radiation therapy tomorrow.  Plan: Continue radiation therapy as planned. She has been given a one-month followup appointment.

## 2012-01-20 NOTE — Progress Notes (Signed)
Patient presents to the clinic today for an under treat visit with Dr. Dayton Scrape. Patient is alert and oriented to person, place, and time. No distress noted. Steady gait noted. Pleasant affect noted. Patient denies pain at this time. Patient has no complaints at this time. Provided patient with additional tube of Radiaplex. Reported all findings to Dr. Dayton Scrape.

## 2012-01-21 ENCOUNTER — Ambulatory Visit
Admission: RE | Admit: 2012-01-21 | Discharge: 2012-01-21 | Disposition: A | Discharge status: Home / Self Care | Payer: Medicare Other | Source: Ambulatory Visit | Attending: Radiation Oncology | Admitting: Radiation Oncology

## 2012-01-23 ENCOUNTER — Encounter: Payer: Self-pay | Admitting: Radiation Oncology

## 2012-01-23 NOTE — Progress Notes (Signed)
[facility] Radiation Oncology  Name:Krista Dunn  Date:01/23/2012           AVW:[id number] DOB:1941-05-19   Status:outpatient    CC: Dr. Jaclynn Guarneri  REFERRING PHYSICIAN: Dr. Jaclynn Guarneri    DIAGNOSIS: Stage I (T1, N0, M0) invasive ductal/DCIS of the right breast   INDICATION FOR TREATMENT: Curative   TREATMENT DATES: 12/09/2011 through 01/21/2012                          SITE/DOSE:   Right breast 4500 cGy 25 sessions right breast boost 1000 cGy 5 sessions                         BEAMS/ENERGY:  6 MV photons tangential fields, treated prone for the first 4500 cGy been changed to a supine position for electron beam boost utilizing 18 MEV electrons en face.                NARRATIVE:   The patient tolerated treatment beautifully with moderate hyperpigmentation/erythema the skin and patchy dry desquamation of the skin by completion of therapy.                         PLAN: Routine followup in one month. Patient instructed to call if questions or worsening complaints in interim.

## 2012-01-24 ENCOUNTER — Encounter: Payer: Self-pay | Admitting: Oncology

## 2012-01-24 ENCOUNTER — Ambulatory Visit: Payer: Medicare Other | Admitting: Oncology

## 2012-01-24 ENCOUNTER — Telehealth: Payer: Self-pay | Admitting: Oncology

## 2012-01-24 ENCOUNTER — Other Ambulatory Visit: Payer: Medicare Other | Admitting: Lab

## 2012-01-24 ENCOUNTER — Other Ambulatory Visit (HOSPITAL_BASED_OUTPATIENT_CLINIC_OR_DEPARTMENT_OTHER): Payer: Medicare Other | Admitting: Lab

## 2012-01-24 ENCOUNTER — Ambulatory Visit (HOSPITAL_BASED_OUTPATIENT_CLINIC_OR_DEPARTMENT_OTHER): Payer: Medicare Other | Admitting: Oncology

## 2012-01-24 VITALS — BP 147/82 | HR 76 | Temp 97.4°F | Ht 65.0 in | Wt 173.2 lb

## 2012-01-24 DIAGNOSIS — Z923 Personal history of irradiation: Secondary | ICD-10-CM

## 2012-01-24 DIAGNOSIS — Z803 Family history of malignant neoplasm of breast: Secondary | ICD-10-CM

## 2012-01-24 DIAGNOSIS — Z170 Estrogen receptor positive status [ER+]: Secondary | ICD-10-CM

## 2012-01-24 DIAGNOSIS — C50919 Malignant neoplasm of unspecified site of unspecified female breast: Secondary | ICD-10-CM

## 2012-01-24 LAB — CBC WITH DIFFERENTIAL/PLATELET
Basophils Absolute: 0 10*3/uL (ref 0.0–0.1)
Eosinophils Absolute: 0.2 10*3/uL (ref 0.0–0.5)
HCT: 39.4 % (ref 34.8–46.6)
HGB: 13.4 g/dL (ref 11.6–15.9)
LYMPH%: 16.6 % (ref 14.0–49.7)
MCV: 92.7 fL (ref 79.5–101.0)
MONO#: 0.3 10*3/uL (ref 0.1–0.9)
MONO%: 6.5 % (ref 0.0–14.0)
NEUT#: 2.9 10*3/uL (ref 1.5–6.5)
NEUT%: 71.7 % (ref 38.4–76.8)
Platelets: 199 10*3/uL (ref 145–400)
WBC: 4.1 10*3/uL (ref 3.9–10.3)

## 2012-01-24 LAB — COMPREHENSIVE METABOLIC PANEL
BUN: 14 mg/dL (ref 6–23)
CO2: 23 mEq/L (ref 19–32)
Creatinine, Ser: 0.72 mg/dL (ref 0.50–1.10)
Glucose, Bld: 93 mg/dL (ref 70–99)
Sodium: 143 mEq/L (ref 135–145)
Total Bilirubin: 0.9 mg/dL (ref 0.3–1.2)
Total Protein: 6.6 g/dL (ref 6.0–8.3)

## 2012-01-24 NOTE — Patient Instructions (Signed)
1. I will see you back in May 15 for follow up   2. Please call me with any problems at [phone]

## 2012-01-24 NOTE — Telephone Encounter (Signed)
gve the pt her may 2013 appt calendar 

## 2012-01-27 NOTE — Progress Notes (Signed)
OFFICE PROGRESS NOTE  CC  Dr. Glenna Fellows Dr. Algie Coffer Dr. Chipper Herb   DIAGNOSIS:  71 yo with: 1. diagnosis of invasive ductal carcinoma of the right breast. 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 T15mic,N0,M0  PRIOR THERAPY: 1. S/P lumpectomy on 10/14/11 for right IDC measuring 0.11 cm ER+98%, PR+85%, Her2Neu negative, ki-67 11% (T80mic, N0)  2. Genetic counseling and testing performed on 11/27 for the comprehensive BRCA1 and BRCA2 mutation negative  3. S/P radiation therapy to the right breast completed on 01/21/12  CURRENT THERAPY:patient at this time declines any anti-estrogen therapy  INTERVAL HISTORY: Krista Dunn 71 y.o. female returns for follow up visit. Overall she is doing great. She has completed her radiation therapy and overall tolerated it very well. She however does have fatigue which is expected. She also tells me that her sister who also has a diagnosis of breast cancer is now at the end stages of her disease. She is being sen by one of my partners. Patient herself has no other complaints. However she is very reluctant at this time to start any further therapy adjuvantly. She is very concerned about the side effects of the therapy. She denies any fevers chills, headaches, nausea or vomiting. Aches or pains.Remainder of the 10 point review of systems is negative.  MEDICAL HISTORY: Past Medical History  Diagnosis Date  . Breast cancer 09/13/2011  . Neck pain   . Liver hemangioma     been follow on CT scan since 2008  . Arthritis     back  . Complication of anesthesia     hard to wake up-goes out fast  . H/O arthroscopy   . Allergy     dilantin =rash    ALLERGIES:  is allergic to epinephrine and procaine hcl.  MEDICATIONS:  Current Outpatient Prescriptions  Medication Sig Dispense Refill  . CALCIUM & MAGNESIUM CARBONATES PO Take 1,500 mg by mouth daily.        .  cholecalciferol (VITAMIN D) 400 UNITS TABS Take 400 Units by mouth 4 (four) times daily.        . diclofenac (VOLTAREN) 75 MG EC tablet Take 75 mg by mouth 2 (two) times daily.        Marland Kitchen HYDROcodone-acetaminophen (VICODIN) 5-500 MG per tablet Take 1 tablet by mouth as needed.        . Multiple Vitamins-Minerals (MULTIVITAMIN WITH MINERALS) tablet Take 1 tablet by mouth daily.        . non-metallic deodorant Thornton Papas) MISC Apply 1 application topically daily as needed.      . Red Yeast Rice 600 MG CAPS Take 600 mg by mouth 2 (two) times daily.        . Vitamin D, Ergocalciferol, (DRISDOL) 50000 UNITS CAPS       . Wound Cleansers (RADIAPLEX EX) Apply topically.      . venlafaxine (EFFEXOR-XR) 37.5 MG 24 hr capsule         SURGICAL HISTORY:  Past Surgical History  Procedure Date  . Right foot surgery     toe implant  . Colonoscopy   . Breast surgery 1988    rt br bx-negative  . Lymph node biopsy 10/14/2011    Procedure: LYMPH NODE BIOPSY;  Surgeon: Mariella Saa, MD;  Location: [facility];  Service: General;  Laterality: Right;  . Abdominal hysterectomy   . Tonsillectomy     REVIEW  OF SYSTEMS:  Constitutional: negative Eyes: negative Ears, nose, mouth, throat, and face: negative Respiratory: negative Cardiovascular: negative Gastrointestinal: negative Genitourinary:negative Integument/breast: positive for breast tenderness and surgical scar Hematologic/lymphatic: negative Musculoskeletal:negative Neurological: negative   PHYSICAL EXAMINATION: General appearance: alert, cooperative, no distress and appears younger than stated age Head: Normocephalic, without obvious abnormality, atraumatic Neck: no adenopathy, no carotid bruit, no JVD, supple, symmetrical, trachea midline and thyroid not enlarged, symmetric, no tenderness/mass/nodules Lymph nodes: Cervical, supraclavicular, and axillary nodes normal. Resp: clear to auscultation bilaterally and normal percussion  bilaterally Back: symmetric, no curvature. ROM normal. No CVA tenderness. Cardio: regular rate and rhythm, S1, S2 normal, no murmur, click, rub or gallop and normal apical impulse GI: soft, non-tender; bowel sounds normal; no masses,  no organomegaly Extremities: extremities normal, atraumatic, no cyanosis or edema Neurologic: Alert and oriented X 3, normal strength and tone. Normal symmetric reflexes. Normal coordination and gait Bilateral Breast Examination: left breast no masses, nipple discharge or retraction; Right breast healing lumpectomy scar, no nipple discharge, redness is noted  ECOG PERFORMANCE STATUS: 1 - Symptomatic but completely ambulatory  Blood pressure 147/82, pulse 76, temperature 97.4 F (36.3 C), temperature source Oral, height 5\' 5"  (1.651 m), weight 173 lb 3.2 oz (78.563 kg).  LABORATORY DATA: Lab Results  Component Value Date   WBC 4.1 01/24/2012   HGB 13.4 01/24/2012   HCT 39.4 01/24/2012   MCV 92.7 01/24/2012   PLT 199 01/24/2012      Chemistry      Component Value Date/Time   NA 143 01/24/2012 1132   K 4.5 01/24/2012 1132   CL 108 01/24/2012 1132   CO2 23 01/24/2012 1132   BUN 14 01/24/2012 1132   CREATININE 0.72 01/24/2012 1132      Component Value Date/Time   CALCIUM 9.6 01/24/2012 1132   ALKPHOS 97 01/24/2012 1132   AST 22 01/24/2012 1132   ALT 24 01/24/2012 1132   BILITOT 0.9 01/24/2012 1132       RADIOGRAPHIC STUDIES:  Nm Sentinel Node Inj-no Rpt (breast)  10/14/2011  CLINICAL DATA: right breast mass   Sulfur colloid was injected intradermally by the nuclear medicine  technologist for breast cancer sentinel node localization.      ASSESSMENT: 71 year old female with:  1.  stage I invasive ductal carcinoma, Er+PR+ Her2 Neu negative. S/P lumpectomy of right breast,   2. S/P radiation therapy completed on 01/23/12  3. Tumor ER+ so to begin adjuvant anti-estrogen but patient declines at present.   PLAN:  1. Extensive discussion about adjuvant  arimidex but patient is very reluctant to start at this time. She feels that the side effects that she may have will for her outweigh the potential benefits.  2. She currently just wants to start feeling better without any other treatments. She understands the potential risks for not taking adjuvant therapy.  All questions were answered. The patient knows to call the clinic with any problems, questions or concerns. We can certainly see the patient much sooner if necessary.  I spent 30 minutes counseling the patient face to face. The total time spent in the appointment was 30 minutes.    Drue Second, MD Medical/Oncology [facility] [phone] (beeper) [phone] (Office)  01/27/2012, 6:22 AM

## 2012-02-26 ENCOUNTER — Ambulatory Visit: Payer: Medicare Other | Admitting: Radiation Oncology

## 2012-03-04 ENCOUNTER — Ambulatory Visit: Payer: Medicare Other | Admitting: Radiation Oncology

## 2012-03-10 ENCOUNTER — Encounter: Payer: Self-pay | Admitting: Radiation Oncology

## 2012-03-10 ENCOUNTER — Ambulatory Visit
Admission: RE | Admit: 2012-03-10 | Discharge: 2012-03-10 | Disposition: A | Discharge status: Home / Self Care | Payer: Medicare Other | Source: Ambulatory Visit | Attending: Radiation Oncology | Admitting: Radiation Oncology

## 2012-03-10 VITALS — BP 118/69 | HR 67 | Temp 97.1°F | Resp 18 | Wt 175.1 lb

## 2012-03-10 DIAGNOSIS — C50919 Malignant neoplasm of unspecified site of unspecified female breast: Secondary | ICD-10-CM

## 2012-03-10 NOTE — Progress Notes (Signed)
Patient presents to the clinic today unaccompanied for a follow up appointment with Dr. Dayton Scrape. Patient is alert and oriented to person, place, and time. No distress noted. Steady gait noted. Pleasant affect noted. Patient denies pain at this time. Patient reports that she continues to use Radiaplex and Alra. Patient reports that she is scheduled to follow up with Dr. Welton Flakes 04/01/2012. Patient reports that her sister is sick with breast cancer has been hospitalized. Patient reports her sister isn't doing well. Patient spoke with this Clinical research associate at length about family issues. Patient reports throbbing pain at night in her right/treated breast. Patient reports the "color is coming back." Patient reports her treated breast is smaller than her opposite breast. Reported all findings to Dr. Dayton Scrape.

## 2012-03-10 NOTE — Progress Notes (Signed)
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 She is without complaints today. She feels that her right breast has "shrunk". She has been tending to her sister who is metastatic breast cancer to brain. She tells me that she will start Arimidex to Dr. Welton Flakes later this month. She has not yet seen Dr. Johna Sheriff.  Physical examination: Nodes: Without palpable cervical, supraclavicular, or axillary lymphadenopathy. Chest: Lungs clear. Breasts: There is residual hyperpigmentation the skin along the right breast. There is mild thickening. No masses are appreciated. Left breast without masses or lesions. Extremities without edema.  Impression: Stage I (T1, N0, M0) invasive ductal/DCIS of the right breast. Her right breast "shrinkage" may be from resolution of her postoperative seroma.  Plan: She'll see Dr. Welton Flakes for a followup visit on May 22 and start Arimidex. I suspect that Dr. Welton Flakes or Dr. Johna Sheriff will get her scheduled for mammography at [facility] later in October or November.

## 2012-04-01 ENCOUNTER — Encounter: Payer: Self-pay | Admitting: Oncology

## 2012-04-01 ENCOUNTER — Telehealth: Payer: Self-pay | Admitting: Oncology

## 2012-04-01 ENCOUNTER — Ambulatory Visit (HOSPITAL_BASED_OUTPATIENT_CLINIC_OR_DEPARTMENT_OTHER): Payer: Medicare Other | Admitting: Oncology

## 2012-04-01 ENCOUNTER — Other Ambulatory Visit (HOSPITAL_BASED_OUTPATIENT_CLINIC_OR_DEPARTMENT_OTHER): Payer: Medicare Other | Admitting: Lab

## 2012-04-01 ENCOUNTER — Ambulatory Visit: Payer: Medicare Other

## 2012-04-01 VITALS — BP 124/74 | HR 70 | Temp 98.4°F | Ht 65.0 in

## 2012-04-01 DIAGNOSIS — N390 Urinary tract infection, site not specified: Secondary | ICD-10-CM

## 2012-04-01 DIAGNOSIS — Z170 Estrogen receptor positive status [ER+]: Secondary | ICD-10-CM

## 2012-04-01 DIAGNOSIS — C50119 Malignant neoplasm of central portion of unspecified female breast: Secondary | ICD-10-CM

## 2012-04-01 DIAGNOSIS — C50919 Malignant neoplasm of unspecified site of unspecified female breast: Secondary | ICD-10-CM

## 2012-04-01 LAB — BASIC METABOLIC PANEL
BUN: 15 mg/dL (ref 6–23)
CO2: 26 mEq/L (ref 19–32)
Calcium: 9.4 mg/dL (ref 8.4–10.5)
Chloride: 104 mEq/L (ref 96–112)
Creatinine, Ser: 0.7 mg/dL (ref 0.50–1.10)
Glucose, Bld: 103 mg/dL — ABNORMAL HIGH (ref 70–99)
Potassium: 4.6 mEq/L (ref 3.5–5.3)
Sodium: 139 mEq/L (ref 135–145)

## 2012-04-01 LAB — URINALYSIS, MICROSCOPIC - CHCC
Ketones: NEGATIVE mg/dL
Protein: NEGATIVE mg/dL
Specific Gravity, Urine: 1.01 (ref 1.003–1.035)
pH: 7.5 (ref 4.6–8.0)

## 2012-04-01 LAB — CBC WITH DIFFERENTIAL/PLATELET
Basophils Absolute: 0 10*3/uL (ref 0.0–0.1)
Eosinophils Absolute: 0.1 10*3/uL (ref 0.0–0.5)
HGB: 13.5 g/dL (ref 11.6–15.9)
LYMPH%: 22.2 % (ref 14.0–49.7)
MONO#: 0.3 10*3/uL (ref 0.1–0.9)
NEUT#: 2.8 10*3/uL (ref 1.5–6.5)
Platelets: 232 10*3/uL (ref 145–400)
RBC: 4.25 10*6/uL (ref 3.70–5.45)
WBC: 4.2 10*3/uL (ref 3.9–10.3)
nRBC: 0 % (ref 0–0)

## 2012-04-01 MED ORDER — ANASTROZOLE 1 MG PO TABS: 1.0000 mg | ORAL_TABLET | Freq: Every day | ORAL | Status: AC

## 2012-04-01 MED ORDER — CIPROFLOXACIN HCL 500 MG PO TABS: 500.0000 mg | ORAL_TABLET | Freq: Two times a day (BID) | ORAL | Status: AC

## 2012-04-01 NOTE — Patient Instructions (Signed)
1. Cipro for UTI  2. Arimidex 1 mg daily  3. See me back in 3 months

## 2012-04-01 NOTE — Telephone Encounter (Signed)
gve the pt her aug 2013 appt calendar 

## 2012-04-01 NOTE — Progress Notes (Signed)
OFFICE PROGRESS NOTE  CC  Dr. Glenna Fellows Dr. Algie Coffer Dr. Chipper Herb   DIAGNOSIS:  71 yo with: 1. diagnosis of invasive ductal carcinoma of the right breast. 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 T52mic,N0,M0  PRIOR THERAPY: 1. S/P lumpectomy on 10/14/11 for right IDC measuring 0.11 cm ER+98%, PR+85%, Her2Neu negative, ki-67 11% (T62mic, N0)  2. Genetic counseling and testing performed on 11/27 for the comprehensive BRCA1 and BRCA2 mutation negative  3. S/P radiation therapy to the right breast completed on 01/21/12  4. Patient decided to try arimidex 1 mg daily starting 04/01/12  CURRENT THERAPY: arimidex 1 mg daily  INTERVAL HISTORY: Krista Dunn 71 y.o. female returns for follow up visit.She has lost her older sister 2 stage IV breast cancer. Patient is very sad and is grieving. She I think seems to be very nervous about her home situation and she and I discussed use of antiestrogen therapy. She also is experiencing what sounds like dysuria and frequency. She has a history of urinary tract infections in the past as well. She is hoping to get a female urologist. We discussed this extensively. She otherwise denies any fevers chills night sweats headaches shortness of breath chest pains palpitations or myalgias and arthralgias. Remainder of the 10 point review of systems is negative.  MEDICAL HISTORY: Past Medical History  Diagnosis Date  . Breast cancer 09/13/2011  . Neck pain   . Liver hemangioma     been follow on CT scan since 2008  . Arthritis     back  . Complication of anesthesia     hard to wake up-goes out fast  . H/O arthroscopy   . Allergy     dilantin =rash    ALLERGIES:  is allergic to epinephrine and procaine hcl.  MEDICATIONS:  Current Outpatient Prescriptions  Medication Sig Dispense Refill  . CALCIUM & MAGNESIUM CARBONATES PO Take 1,500 mg by mouth daily.        .  cholecalciferol (VITAMIN D) 400 UNITS TABS Take 400 Units by mouth 4 (four) times daily.        . diclofenac (VOLTAREN) 75 MG EC tablet Take 75 mg by mouth 2 (two) times daily.        Marland Kitchen HYDROcodone-acetaminophen (VICODIN) 5-500 MG per tablet Take 1 tablet by mouth as needed.        . Multiple Vitamins-Minerals (MULTIVITAMIN WITH MINERALS) tablet Take 1 tablet by mouth daily.        . non-metallic deodorant Thornton Papas) MISC Apply 1 application topically daily as needed.      . Red Yeast Rice 600 MG CAPS Take 600 mg by mouth 2 (two) times daily.        Marland Kitchen venlafaxine (EFFEXOR-XR) 37.5 MG 24 hr capsule       . Vitamin D, Ergocalciferol, (DRISDOL) 50000 UNITS CAPS       . Wound Cleansers (RADIAPLEX EX) Apply topically.        SURGICAL HISTORY:  Past Surgical History  Procedure Date  . Right foot surgery     toe implant  . Colonoscopy   . Breast surgery 1988    rt br bx-negative  . Lymph node biopsy 10/14/2011    Procedure: LYMPH NODE BIOPSY;  Surgeon: Mariella Saa, MD;  Location: [facility];  Service: General;  Laterality: Right;  . Abdominal hysterectomy   . Tonsillectomy  REVIEW OF SYSTEMS:  Constitutional: negative Eyes: negative Ears, nose, mouth, throat, and face: negative Respiratory: negative Cardiovascular: negative Gastrointestinal: negative Genitourinary:negative Integument/breast: positive for breast tenderness and surgical scar Hematologic/lymphatic: negative Musculoskeletal:negative Neurological: negative   PHYSICAL EXAMINATION: General appearance: alert, cooperative, no distress and appears younger than stated age Head: Normocephalic, without obvious abnormality, atraumatic Neck: no adenopathy, no carotid bruit, no JVD, supple, symmetrical, trachea midline and thyroid not enlarged, symmetric, no tenderness/mass/nodules Lymph nodes: Cervical, supraclavicular, and axillary nodes normal. Resp: clear to auscultation bilaterally and normal percussion  bilaterally Back: symmetric, no curvature. ROM normal. No CVA tenderness. Cardio: regular rate and rhythm, S1, S2 normal, no murmur, click, rub or gallop and normal apical impulse GI: soft, non-tender; bowel sounds normal; no masses,  no organomegaly Extremities: extremities normal, atraumatic, no cyanosis or edema Neurologic: Alert and oriented X 3, normal strength and tone. Normal symmetric reflexes. Normal coordination and gait Bilateral Breast Examination: left breast no masses, nipple discharge or retraction; Right breast healing lumpectomy scar, no nipple discharge, redness is noted  ECOG PERFORMANCE STATUS: 1 - Symptomatic but completely ambulatory  Blood pressure 124/74, pulse 70, temperature 98.4 F (36.9 C), temperature source Oral, height 5\' 5"  (1.651 m).  LABORATORY DATA: Lab Results  Component Value Date   WBC 4.2 04/01/2012   HGB 13.5 04/01/2012   HCT 40.4 04/01/2012   MCV 95.1 04/01/2012   PLT 232 04/01/2012      Chemistry      Component Value Date/Time   NA 143 01/24/2012 1132   K 4.5 01/24/2012 1132   CL 108 01/24/2012 1132   CO2 23 01/24/2012 1132   BUN 14 01/24/2012 1132   CREATININE 0.72 01/24/2012 1132      Component Value Date/Time   CALCIUM 9.6 01/24/2012 1132   ALKPHOS 97 01/24/2012 1132   AST 22 01/24/2012 1132   ALT 24 01/24/2012 1132   BILITOT 0.9 01/24/2012 1132       RADIOGRAPHIC STUDIES:  Nm Sentinel Node Inj-no Rpt (breast)  10/14/2011  CLINICAL DATA: right breast mass   Sulfur colloid was injected intradermally by the nuclear medicine  technologist for breast cancer sentinel node localization.      ASSESSMENT: 71 year old female with:  1.  stage I invasive ductal carcinoma, Er+PR+ Her2 Neu negative. S/P lumpectomy of right breast,   2. S/P radiation therapy completed on 01/23/12  3. Tumor ER+ so to begin adjuvant anti-estrogen but patient declined it . However she at this time under the circumstances would like to try the Arimidex. Risks and  benefits of this are discussed.   PLAN:   1. Extensive discussion about adjuvant arimidex A. Patient at this time tells me that she may be ready to try Arimidex. I do think that she is quite scared since her sister just recently passed away from breast cancer. We again discussed the risks and benefits of this medication. A prescription was given to her today.  #2 UTI presumed I will give her Cipro 500 mg twice a day. She will use it for 14 days. She is also advised to be seen by a urologist.  #3 I will plan on seeing her back in 3 month's time in followup.   All questions were answered. The patient knows to call the clinic with any problems, questions or concerns. We can certainly see the patient much sooner if necessary.  I spent 30 minutes counseling the patient face to face. The total time spent in the appointment was  30 minutes.    Drue Second, MD Medical/Oncology [facility] [phone] (beeper) [phone] (Office)  04/01/2012, 3:20 PM

## 2012-04-02 ENCOUNTER — Telehealth: Payer: Self-pay | Admitting: *Deleted

## 2012-04-02 NOTE — Telephone Encounter (Signed)
Message copied by Cooper Render on Thu Apr 02, 2012 11:49 AM ------      Message from: Krista Dunn      Created: Thu Apr 02, 2012  6:41 AM       Call patient: urine shows infection, continue antibiotic as prescribed

## 2012-04-02 NOTE — Telephone Encounter (Signed)
Called pt lmovm to call back concerns recent labs.  Message to pt : Urine shows infection, continue antibiotic as prescribed.

## 2012-04-03 LAB — URINE CULTURE

## 2012-04-03 NOTE — Telephone Encounter (Signed)
Pt returned call. Informed pt Urine show some infection. Continue antibiotic as prescribed. Pt verbalized understanding.

## 2012-04-18 ENCOUNTER — Other Ambulatory Visit (INDEPENDENT_AMBULATORY_CARE_PROVIDER_SITE_OTHER): Payer: Self-pay | Admitting: General Surgery

## 2012-05-08 ENCOUNTER — Telehealth: Payer: Self-pay | Admitting: *Deleted

## 2012-05-08 NOTE — Telephone Encounter (Signed)
Pt called states she is having hives since started taking the arimidex. Reviewed with MD. Notified pt per MD to stop Arimidex until f/u visit in august. Take benadryl 25mg  POl for itching/hives. Pt verbalized understanding.

## 2012-07-03 ENCOUNTER — Other Ambulatory Visit: Payer: Self-pay | Admitting: *Deleted

## 2012-07-03 DIAGNOSIS — C50919 Malignant neoplasm of unspecified site of unspecified female breast: Secondary | ICD-10-CM

## 2012-07-06 ENCOUNTER — Other Ambulatory Visit: Payer: Self-pay | Admitting: Medical Oncology

## 2012-07-06 ENCOUNTER — Other Ambulatory Visit: Payer: Medicare Other | Admitting: Lab

## 2012-07-06 ENCOUNTER — Telehealth: Payer: Self-pay | Admitting: Oncology

## 2012-07-06 ENCOUNTER — Ambulatory Visit: Payer: Medicare Other | Admitting: Oncology

## 2012-07-06 NOTE — Telephone Encounter (Signed)
S/w the pt and she is aware of her sept appts °

## 2012-08-03 ENCOUNTER — Telehealth: Payer: Self-pay | Admitting: *Deleted

## 2012-08-03 ENCOUNTER — Other Ambulatory Visit (HOSPITAL_BASED_OUTPATIENT_CLINIC_OR_DEPARTMENT_OTHER): Payer: Medicare Other | Admitting: Lab

## 2012-08-03 ENCOUNTER — Encounter: Payer: Self-pay | Admitting: Oncology

## 2012-08-03 ENCOUNTER — Ambulatory Visit (HOSPITAL_BASED_OUTPATIENT_CLINIC_OR_DEPARTMENT_OTHER): Payer: Medicare Other | Admitting: Oncology

## 2012-08-03 VITALS — BP 131/76 | HR 66 | Temp 98.7°F | Resp 20 | Ht 65.0 in | Wt 173.0 lb

## 2012-08-03 DIAGNOSIS — C50919 Malignant neoplasm of unspecified site of unspecified female breast: Secondary | ICD-10-CM

## 2012-08-03 DIAGNOSIS — C50119 Malignant neoplasm of central portion of unspecified female breast: Secondary | ICD-10-CM

## 2012-08-03 DIAGNOSIS — E559 Vitamin D deficiency, unspecified: Secondary | ICD-10-CM

## 2012-08-03 LAB — CBC WITH DIFFERENTIAL/PLATELET
BASO%: 0.5 % (ref 0.0–2.0)
Basophils Absolute: 0 10*3/uL (ref 0.0–0.1)
HCT: 41.4 % (ref 34.8–46.6)
HGB: 13.9 g/dL (ref 11.6–15.9)
MONO#: 0.2 10*3/uL (ref 0.1–0.9)
NEUT#: 3.1 10*3/uL (ref 1.5–6.5)
NEUT%: 66.2 % (ref 38.4–76.8)
WBC: 4.7 10*3/uL (ref 3.9–10.3)
lymph#: 1.2 10*3/uL (ref 0.9–3.3)

## 2012-08-03 LAB — COMPREHENSIVE METABOLIC PANEL (CC13)
ALT: 38 U/L (ref 0–55)
BUN: 12 mg/dL (ref 7.0–26.0)
CO2: 25 mEq/L (ref 22–29)
Calcium: 9.7 mg/dL (ref 8.4–10.4)
Chloride: 106 mEq/L (ref 98–107)
Creatinine: 0.8 mg/dL (ref 0.6–1.1)

## 2012-08-03 MED ORDER — EXEMESTANE 25 MG PO TABS: 25.0000 mg | ORAL_TABLET | Freq: Every day | ORAL | Status: DC

## 2012-08-03 NOTE — Progress Notes (Signed)
OFFICE PROGRESS NOTE  CC  Dr. Glenna Fellows Dr. Algie Coffer Dr. Chipper Herb   DIAGNOSIS:  71 yo with: 1. diagnosis of invasive ductal carcinoma of the right breast. 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 T75mic,N0,M0  PRIOR THERAPY: 1. S/P lumpectomy on 10/14/11 for right IDC measuring 0.11 cm ER+98%, PR+85%, Her2Neu negative, ki-67 11% (T19mic, N0)  2. Genetic counseling and testing performed on 11/27 for the comprehensive BRCA1 and BRCA2 mutation negative  3. S/P radiation therapy to the right breast completed on 01/21/12  4. Patient decided to try arimidex 1 mg daily starting 04/01/12 however she developed hives and this was discontinued about a month ago.  #5 patient will now begin Aromasin 25 mg starting today 08/03/2012.  CURRENT THERAPY: Aromasin 25 mg daily  INTERVAL HISTORY: Krista Dunn 71 y.o. female returns for follow up visit.patient is doing well. As stated she developed hives secondary to the Arimidex this was discontinued and the hives resolved. Today she is feeling well she has no complaints. She does have some back pain which is ongoing chronic apparently she's been recommended a fusion but she does not want this at this time she is going to a chiropractor for this. She otherwise denies any fevers chills night sweats headaches shortness of breath chest pains palpitations patient does have some fatigue. Remainder of the tampon review of systems is negative. MEDICAL HISTORY: Past Medical History  Diagnosis Date  . Breast cancer 09/13/2011  . Neck pain   . Liver hemangioma     been follow on CT scan since 2008  . Arthritis     back  . Complication of anesthesia     hard to wake up-goes out fast  . H/O arthroscopy   . Allergy     dilantin =rash    ALLERGIES:  is allergic to epinephrine and procaine hcl.  MEDICATIONS:  Current Outpatient Prescriptions  Medication Sig Dispense  Refill  . CALCIUM & MAGNESIUM CARBONATES PO Take 1,500 mg by mouth daily.        . cholecalciferol (VITAMIN D) 400 UNITS TABS Take 400 Units by mouth 4 (four) times daily.        . diclofenac (VOLTAREN) 75 MG EC tablet Take 75 mg by mouth 2 (two) times daily.        Marland Kitchen HYDROcodone-acetaminophen (VICODIN) 5-500 MG per tablet Take 1 tablet by mouth as needed.        . Multiple Vitamins-Minerals (MULTIVITAMIN WITH MINERALS) tablet Take 1 tablet by mouth daily.        . non-metallic deodorant Thornton Papas) MISC Apply 1 application topically daily as needed.      . Red Yeast Rice 600 MG CAPS Take 600 mg by mouth 2 (two) times daily.        Marland Kitchen venlafaxine (EFFEXOR-XR) 37.5 MG 24 hr capsule       . Vitamin D, Ergocalciferol, (DRISDOL) 50000 UNITS CAPS       . Wound Cleansers (RADIAPLEX EX) Apply topically.        SURGICAL HISTORY:  Past Surgical History  Procedure Date  . Right foot surgery     toe implant  . Colonoscopy   . Breast surgery 1988    rt br bx-negative  . Lymph node biopsy 10/14/2011    Procedure: LYMPH NODE BIOPSY;  Surgeon: Mariella Saa, MD;  Location: [facility];  Service: General;  Laterality: Right;  .  Abdominal hysterectomy   . Tonsillectomy     REVIEW OF SYSTEMS:  Constitutional: negative Eyes: negative Ears, nose, mouth, throat, and face: negative Respiratory: negative Cardiovascular: negative Gastrointestinal: negative Genitourinary:negative Integument/breast: positive for breast tenderness and surgical scar Hematologic/lymphatic: negative Musculoskeletal:negative Neurological: negative   PHYSICAL EXAMINATION:  Bilateral Breast Examination: left breast no masses, nipple discharge or retraction; Right breast healing lumpectomy scar, no nipple discharge, redness is noted  ECOG PERFORMANCE STATUS: 1 - Symptomatic but completely ambulatory  Blood pressure 131/76, pulse 66, temperature 98.7 F (37.1 C), temperature source Oral, resp. rate 20, height  5\' 5"  (1.651 m), weight 173 lb (78.472 kg).  LABORATORY DATA: Lab Results  Component Value Date   WBC 4.7 08/03/2012   HGB 13.9 08/03/2012   HCT 41.4 08/03/2012   MCV 95.0 08/03/2012   PLT 216 08/03/2012      Chemistry      Component Value Date/Time   NA 139 04/01/2012 1453   K 4.6 04/01/2012 1453   CL 104 04/01/2012 1453   CO2 26 04/01/2012 1453   BUN 15 04/01/2012 1453   CREATININE 0.70 04/01/2012 1453      Component Value Date/Time   CALCIUM 9.4 04/01/2012 1453   ALKPHOS 97 01/24/2012 1132   AST 22 01/24/2012 1132   ALT 24 01/24/2012 1132   BILITOT 0.9 01/24/2012 1132       RADIOGRAPHIC STUDIES:  Nm Sentinel Node Inj-no Rpt (breast)  10/14/2011  CLINICAL DATA: right breast mass   Sulfur colloid was injected intradermally by the nuclear medicine  technologist for breast cancer sentinel node localization.      ASSESSMENT: 71 year old female with:  1.  stage I invasive ductal carcinoma, Er+PR+ Her2 Neu negative. S/P lumpectomy of right breast,   2. S/P radiation therapy completed on 01/23/12  #3 patient was begun on Arimidex unfortunate she developed hives. We again discussed anti-estrogen therapy today. She and I discussed the possibility of doing Aromasin 25 mg daily risks and benefits and literature were given to the patient. We also discussed tamoxifen but she is very adamant about not going on tamoxifen and she does a lot of people who have not done very well with tamoxifen.  PLAN:  #1 patient will proceed with Aromasin 25 mg daily. Prescription was given to her for 30 with 2 refills. She is instructed very clearly to call if she develops any problems and to discontinue the medicine.  #2 I will see her back in 1 month's time or sooner if need arises.  All questions were answered. The patient knows to call the clinic with any problems, questions or concerns. We can certainly see the patient much sooner if necessary.  I spent 25 minutes counseling the patient face to face. The  total time spent in the appointment was 30 minutes.    Drue Second, MD Medical/Oncology [facility] [phone] (beeper) [phone] (Office)  08/03/2012, 2:55 PM

## 2012-08-03 NOTE — Patient Instructions (Signed)
aromasin 25 mg daily  Exemestane tablets What is this medicine? EXEMESTANE (ex e MES tane) blocks the production of the hormone estrogen. Some types of breast cancer depend on estrogen to grow, and this medicine can stop tumor growth by blocking estrogen production. This medicine is for the treatment of breast cancer in postmenopausal women only. This medicine may be used for other purposes; ask your health care provider or pharmacist if you have questions. What should I tell my health care provider before I take this medicine? They need to know if you have any of these conditions: -an unusual or allergic reaction to exemestane, other medicines, foods, dyes, or preservatives -pregnant or trying to get pregnant -breast-feeding How should I use this medicine? Take this medicine by mouth with a glass of water. Follow the directions on the prescription label. Take your doses at regular intervals after a meal. Do not take your medicine more often than directed. Do not stop taking except on the advice of your doctor or health care professional. Contact your pediatrician regarding the use of this medicine in children. Special care may be needed. Overdosage: If you think you have taken too much of this medicine contact a poison control center or emergency room at once. NOTE: This medicine is only for you. Do not share this medicine with others. What if I miss a dose? If you miss a dose, take the next dose as usual. Do not try to make up the missed dose. Do not take double or extra doses. What may interact with this medicine? Do not take this medicine with any of the following medications: -female hormones, like estrogens and birth control pills This medicine may also interact with the following medications: -androstenedione -phenytoin -rifabutin, rifampin, or rifapentine -St. John's Wort This list may not describe all possible interactions. Give your health care provider a list of all the  medicines, herbs, non-prescription drugs, or dietary supplements you use. Also tell them if you smoke, drink alcohol, or use illegal drugs. Some items may interact with your medicine. What should I watch for while using this medicine? Visit your doctor or health care professional for regular checks on your progress. If you experience hot flashes or sweating while taking this medicine, avoid alcohol, smoking and drinks with caffeine. This may help to decrease these side effects. What side effects may I notice from receiving this medicine? Side effects that you should report to your doctor or health care professional as soon as possible: -any new or unusual symptoms -changes in vision -fever -leg or arm swelling -pain in bones, joints, or muscles -pain in hips, back, ribs, arms, shoulders, or legs Side effects that usually do not require medical attention (report to your doctor or health care professional if they continue or are bothersome): -difficulty sleeping -headache -hot flashes -sweating -unusually weak or tired This list may not describe all possible side effects. Call your doctor for medical advice about side effects. You may report side effects to FDA at 1-800-FDA-1088. Where should I keep my medicine? Keep out of the reach of children. Store at room temperature between 15 and 30 degrees C (59 and 86 degrees F). Throw away any unused medicine after the expiration date. NOTE: This sheet is a summary. It may not cover all possible information. If you have questions about this medicine, talk to your doctor, pharmacist, or health care provider.  2012, [employer]. (03/01/2008 11:48:29 AM)

## 2012-08-03 NOTE — Telephone Encounter (Signed)
Made patient appointment for 09-25-2012 starting at 1:30pm

## 2012-09-25 ENCOUNTER — Other Ambulatory Visit: Payer: Medicare Other | Admitting: Lab

## 2012-09-25 ENCOUNTER — Ambulatory Visit: Payer: Medicare Other | Admitting: Adult Health

## 2012-10-23 ENCOUNTER — Telehealth: Payer: Self-pay | Admitting: *Deleted

## 2012-10-23 ENCOUNTER — Encounter: Payer: Self-pay | Admitting: Oncology

## 2012-10-23 ENCOUNTER — Ambulatory Visit (HOSPITAL_BASED_OUTPATIENT_CLINIC_OR_DEPARTMENT_OTHER): Payer: Medicare Other | Admitting: Oncology

## 2012-10-23 ENCOUNTER — Other Ambulatory Visit (HOSPITAL_BASED_OUTPATIENT_CLINIC_OR_DEPARTMENT_OTHER): Payer: Medicare Other | Admitting: Lab

## 2012-10-23 VITALS — BP 134/71 | HR 76 | Temp 98.6°F | Resp 20 | Ht 65.0 in | Wt 179.2 lb

## 2012-10-23 DIAGNOSIS — E559 Vitamin D deficiency, unspecified: Secondary | ICD-10-CM

## 2012-10-23 DIAGNOSIS — C50919 Malignant neoplasm of unspecified site of unspecified female breast: Secondary | ICD-10-CM

## 2012-10-23 DIAGNOSIS — Z170 Estrogen receptor positive status [ER+]: Secondary | ICD-10-CM

## 2012-10-23 LAB — CBC WITH DIFFERENTIAL/PLATELET
BASO%: 0.6 % (ref 0.0–2.0)
HCT: 39.7 % (ref 34.8–46.6)
MCHC: 33.9 g/dL (ref 31.5–36.0)
MONO#: 0.3 10*3/uL (ref 0.1–0.9)
NEUT%: 66 % (ref 38.4–76.8)
WBC: 4.6 10*3/uL (ref 3.9–10.3)
lymph#: 1.1 10*3/uL (ref 0.9–3.3)

## 2012-10-23 LAB — COMPREHENSIVE METABOLIC PANEL (CC13)
ALT: 114 U/L — ABNORMAL HIGH (ref 0–55)
AST: 55 U/L — ABNORMAL HIGH (ref 5–34)
Albumin: 4.1 g/dL (ref 3.5–5.0)
Calcium: 10 mg/dL (ref 8.4–10.4)
Chloride: 108 mEq/L — ABNORMAL HIGH (ref 98–107)
Potassium: 4 mEq/L (ref 3.5–5.1)
Sodium: 143 mEq/L (ref 136–145)
Total Protein: 7.4 g/dL (ref 6.4–8.3)

## 2012-10-23 NOTE — Telephone Encounter (Signed)
Gave patient appointment for 04-30-2013 starting at 2:00pm

## 2012-10-23 NOTE — Patient Instructions (Addendum)
Continue aromasin   Exercise and diet plan discussed  I will see you back in 6 months

## 2012-10-24 LAB — VITAMIN D 25 HYDROXY (VIT D DEFICIENCY, FRACTURES): Vit D, 25-Hydroxy: 50 ng/mL (ref 30–89)

## 2012-11-15 NOTE — Progress Notes (Signed)
OFFICE PROGRESS NOTE  CC  Dr. Glenna Fellows Dr. Algie Coffer Dr. Chipper Herb   DIAGNOSIS:  72 yo with: 1. diagnosis of invasive ductal carcinoma of the right breast. 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 T55mic,N0,M0  PRIOR THERAPY: 1. S/P lumpectomy on 10/14/11 for right IDC measuring 0.11 cm ER+98%, PR+85%, Her2Neu negative, ki-67 11% (T63mic, N0)  2. Genetic counseling and testing performed on 11/27 for the comprehensive BRCA1 and BRCA2 mutation negative  3. S/P radiation therapy to the right breast completed on 01/21/12  4. Patient decided to try arimidex 1 mg daily starting 04/01/12 however she developed hives and this was discontinued about a month ago.  #5 patient will now begin Aromasin 25 mg starting  08/03/2012.  CURRENT THERAPY: Aromasin 25 mg daily  INTERVAL HISTORY: Krista Dunn 72 y.o. female returns for follow up visit.Overall she's doing well she is tolerating Aromasin better. She has no nausea vomiting no fevers chills however she is concerned about her weight. She is eating a lot more. We discussed exercise and eating healthy habits today extensively. She has no nausea vomiting no myalgias and arthralgias remainder of the 10 point review of systems is negative.  MEDICAL HISTORY: Past Medical History  Diagnosis Date  . Breast cancer 09/13/2011  . Neck pain   . Liver hemangioma     been follow on CT scan since 2008  . Arthritis     back  . Complication of anesthesia     hard to wake up-goes out fast  . H/O arthroscopy   . Allergy     dilantin =rash    ALLERGIES:  is allergic to epinephrine and procaine hcl.  MEDICATIONS:  Current Outpatient Prescriptions  Medication Sig Dispense Refill  . CALCIUM & MAGNESIUM CARBONATES PO Take 1,500 mg by mouth daily.        . cholecalciferol (VITAMIN D) 400 UNITS TABS Take 400 Units by mouth 4 (four) times daily.        . diclofenac  (VOLTAREN) 75 MG EC tablet Take 75 mg by mouth 2 (two) times daily.        Marland Kitchen exemestane (AROMASIN) 25 MG tablet Take 1 tablet (25 mg total) by mouth daily after breakfast.  30 tablet  3  . HYDROcodone-acetaminophen (VICODIN) 5-500 MG per tablet Take 1 tablet by mouth as needed.        . Multiple Vitamins-Minerals (MULTIVITAMIN WITH MINERALS) tablet Take 1 tablet by mouth daily.        . non-metallic deodorant Thornton Papas) MISC Apply 1 application topically daily as needed.      . Red Yeast Rice 600 MG CAPS Take 600 mg by mouth 2 (two) times daily.        . Vitamin D, Ergocalciferol, (DRISDOL) 50000 UNITS CAPS         SURGICAL HISTORY:  Past Surgical History  Procedure Date  . Right foot surgery     toe implant  . Colonoscopy   . Breast surgery 1988    rt br bx-negative  . Lymph node biopsy 10/14/2011    Procedure: LYMPH NODE BIOPSY;  Surgeon: Mariella Saa, MD;  Location: [facility];  Service: General;  Laterality: Right;  . Abdominal hysterectomy   . Tonsillectomy     REVIEW OF SYSTEMS:    Patient is alert oriented she is in no acute distress She denies any fevers chills she does have  night sweats and hot flashes. She is concerned about her weight she has no myalgias and arthralgias no diarrhea or constipation remainder of the 10 point review of systems is negative her PHYSICAL EXAMINATION:  HEENT exam EOMI PERRLA sclerae anicteric no conjunctival pallor oral mucosa is moist neck is supple lungs are clear cardiovascular regular rate rhythm no murmurs gallops or rubs abdomen is soft nontender nondistended bowel sounds are present noticed them extremities no edema neuro patient's alert oriented otherwise nonfocal Bilateral Breast Examination: left breast no masses, nipple discharge or retraction; Right breast healing lumpectomy scar, no nipple discharge, redness is noted  ECOG PERFORMANCE STATUS: 1 - Symptomatic but completely ambulatory  Blood pressure 134/71, pulse 76,  temperature 98.6 F (37 C), temperature source Oral, resp. rate 20, height 5\' 5"  (1.651 m), weight 179 lb 3.2 oz (81.285 kg).  LABORATORY DATA: Lab Results  Component Value Date   WBC 4.6 10/23/2012   HGB 13.5 10/23/2012   HCT 39.7 10/23/2012   MCV 94.4 10/23/2012   PLT 231 10/23/2012      Chemistry      Component Value Date/Time   NA 143 10/23/2012 1533   NA 139 04/01/2012 1453   K 4.0 10/23/2012 1533   K 4.6 04/01/2012 1453   CL 108* 10/23/2012 1533   CL 104 04/01/2012 1453   CO2 26 10/23/2012 1533   CO2 26 04/01/2012 1453   BUN 15.0 10/23/2012 1533   BUN 15 04/01/2012 1453   CREATININE 0.8 10/23/2012 1533   CREATININE 0.70 04/01/2012 1453      Component Value Date/Time   CALCIUM 10.0 10/23/2012 1533   CALCIUM 9.4 04/01/2012 1453   ALKPHOS 137 10/23/2012 1533   ALKPHOS 97 01/24/2012 1132   AST 55* 10/23/2012 1533   AST 22 01/24/2012 1132   ALT 114* 10/23/2012 1533   ALT 24 01/24/2012 1132   BILITOT 1.03 10/23/2012 1533   BILITOT 0.9 01/24/2012 1132       RADIOGRAPHIC STUDIES:  Nm Sentinel Node Inj-no Rpt (breast)  10/14/2011  CLINICAL DATA: right breast mass   Sulfur colloid was injected intradermally by the nuclear medicine  technologist for breast cancer sentinel node localization.      ASSESSMENT: 72 year old female with:  1.  stage I invasive ductal carcinoma, Er+PR+ Her2 Neu negative. S/P lumpectomy of right breast,   2. S/P radiation therapy completed on 01/23/12  #3 patient was begun on Arimidex unfortunate she developed hives. We again discussed anti-estrogen therapy today. She and I discussed the possibility of doing Aromasin 25 mg daily risks and benefits and literature were given to the patient.   PLAN:  Your 1 patient will continue Aromasin 25 mg daily.  #2 we discussed exercise and diet plan.  #3 I will see her back in 6 months time for followup.  All questions were answered. The patient knows to call the clinic with any problems, questions or  concerns. We can certainly see the patient much sooner if necessary.  I spent 40 minutes counseling the patient face to face. The total time spent in the appointment was 30 minutes.    Drue Second, MD Medical/Oncology [facility] [phone] (beeper) [phone] (Office)  11/15/2012, 4:22 PM

## 2013-02-26 ENCOUNTER — Emergency Department (HOSPITAL_COMMUNITY)
Admission: EM | Admit: 2013-02-26 | Discharge: 2013-02-26 | Disposition: A | Discharge status: Home / Self Care | Payer: Medicare Other | Attending: Emergency Medicine | Admitting: Emergency Medicine

## 2013-02-26 ENCOUNTER — Emergency Department (HOSPITAL_COMMUNITY): Payer: Medicare Other

## 2013-02-26 ENCOUNTER — Encounter (HOSPITAL_COMMUNITY): Payer: Self-pay | Admitting: Emergency Medicine

## 2013-02-26 ENCOUNTER — Other Ambulatory Visit (HOSPITAL_COMMUNITY): Payer: Medicare Other

## 2013-02-26 DIAGNOSIS — Z853 Personal history of malignant neoplasm of breast: Secondary | ICD-10-CM | POA: Insufficient documentation

## 2013-02-26 DIAGNOSIS — R51 Headache: Secondary | ICD-10-CM | POA: Insufficient documentation

## 2013-02-26 DIAGNOSIS — M129 Arthropathy, unspecified: Secondary | ICD-10-CM | POA: Insufficient documentation

## 2013-02-26 DIAGNOSIS — R5381 Other malaise: Secondary | ICD-10-CM | POA: Insufficient documentation

## 2013-02-26 DIAGNOSIS — D496 Neoplasm of unspecified behavior of brain: Secondary | ICD-10-CM

## 2013-02-26 DIAGNOSIS — Z8505 Personal history of malignant neoplasm of liver: Secondary | ICD-10-CM | POA: Insufficient documentation

## 2013-02-26 DIAGNOSIS — Z8739 Personal history of other diseases of the musculoskeletal system and connective tissue: Secondary | ICD-10-CM | POA: Insufficient documentation

## 2013-02-26 DIAGNOSIS — M25559 Pain in unspecified hip: Secondary | ICD-10-CM | POA: Insufficient documentation

## 2013-02-26 DIAGNOSIS — Z79899 Other long term (current) drug therapy: Secondary | ICD-10-CM | POA: Insufficient documentation

## 2013-02-26 DIAGNOSIS — H9209 Otalgia, unspecified ear: Secondary | ICD-10-CM | POA: Insufficient documentation

## 2013-02-26 LAB — COMPREHENSIVE METABOLIC PANEL
AST: 32 U/L (ref 0–37)
Albumin: 3.9 g/dL (ref 3.5–5.2)
Alkaline Phosphatase: 82 U/L (ref 39–117)
BUN: 10 mg/dL (ref 6–23)
CO2: 26 mEq/L (ref 19–32)
Chloride: 104 mEq/L (ref 96–112)
Creatinine, Ser: 0.65 mg/dL (ref 0.50–1.10)
GFR calc non Af Amer: 87 mL/min — ABNORMAL LOW (ref >90)
Potassium: 4.2 mEq/L (ref 3.5–5.1)
Total Bilirubin: 0.7 mg/dL (ref 0.3–1.2)

## 2013-02-26 LAB — CBC WITH DIFFERENTIAL/PLATELET
Basophils Absolute: 0 10*3/uL (ref 0.0–0.1)
Basophils Relative: 0 % (ref 0–1)
HCT: 40 % (ref 36.0–46.0)
Hemoglobin: 13.9 g/dL (ref 12.0–15.0)
Lymphocytes Relative: 21 % (ref 12–46)
MCHC: 34.8 g/dL (ref 30.0–36.0)
Monocytes Absolute: 0.2 10*3/uL (ref 0.1–1.0)
Monocytes Relative: 4 % (ref 3–12)
Neutro Abs: 3.5 10*3/uL (ref 1.7–7.7)
Neutrophils Relative %: 73 % (ref 43–77)
WBC: 4.8 10*3/uL (ref 4.0–10.5)

## 2013-02-26 LAB — POCT I-STAT, CHEM 8
Calcium, Ion: 1.23 mmol/L (ref 1.13–1.30)
HCT: 42 % (ref 36.0–46.0)
TCO2: 25 mmol/L (ref 0–100)

## 2013-02-26 MED ORDER — MECLIZINE HCL 25 MG PO TABS
50.0000 mg | ORAL_TABLET | Freq: Once | ORAL | Status: AC
  Administered 2013-02-26: 50 mg via ORAL
  Filled 2013-02-26: qty 1
  Filled 2013-02-26: qty 2

## 2013-02-26 MED ORDER — DEXAMETHASONE 2 MG PO TABS: 2.0000 mg | ORAL_TABLET | Freq: Two times a day (BID) | ORAL | Status: DC

## 2013-02-26 MED ORDER — MECLIZINE HCL 25 MG PO TABS: 25.0000 mg | ORAL_TABLET | Freq: Four times a day (QID) | ORAL | Status: DC

## 2013-02-26 MED ORDER — DEXAMETHASONE SODIUM PHOSPHATE 10 MG/ML IJ SOLN
10.0000 mg | Freq: Once | INTRAMUSCULAR | Status: AC
  Administered 2013-02-26: 10 mg via INTRAVENOUS
  Filled 2013-02-26: qty 1

## 2013-02-26 MED ORDER — GADOBENATE DIMEGLUMINE 529 MG/ML IV SOLN
16.0000 mL | Freq: Once | INTRAVENOUS | Status: AC | PRN
Start: 2013-02-26 — End: 2013-02-26
  Administered 2013-02-26: 16 mL via INTRAVENOUS

## 2013-02-26 NOTE — ED Notes (Signed)
Pt complains of dizziness and "my right side feels funny" Pt describes the a tightness to neck area on the right side and states that this started after the gym and seeing the chiropractor.

## 2013-02-26 NOTE — ED Provider Notes (Signed)
History     CSN: [id number]  Arrival date & time 02/26/13  1147   First MD Initiated Contact with Patient 02/26/13 1227      Chief Complaint  Patient presents with  . Dizziness    (Consider location/radiation/quality/duration/timing/severity/associated sxs/prior treatment) The history is provided by the patient.   patient here with sudden onset of dizziness that started yesterday. Does have history of vertigo and states this is different. Patient however does note that the dizziness is worse with certain positions moving her head from side to side. Better with remaining still. Does note some ataxia and nausea no vomiting. No fever or chills. Has had some neck pain that started 2 hours after workout yesterday. Denies numbness or tingling to her relatives have her body. No visual changes. No medications used for this prior to arrival. Denies any chest or abdominal pain.  Past Medical History  Diagnosis Date  . Breast cancer 09/13/2011  . Neck pain   . Liver hemangioma     been follow on CT scan since 2008  . Arthritis     back  . Complication of anesthesia     hard to wake up-goes out fast  . Allergy     dilantin =rash    Past Surgical History  Procedure Laterality Date  . Right foot surgery      toe implant  . Colonoscopy    . Breast surgery  1988    rt br bx-negative  . Lymph node biopsy  10/14/2011    Procedure: LYMPH NODE BIOPSY;  Surgeon: Mariella Saa, MD;  Location: [facility];  Service: General;  Laterality: Right;  . Abdominal hysterectomy      Family History  Problem Relation Age of Onset  . Cancer Maternal Grandmother 60    brain cancer  . Cancer Father 26    colon cancer  . Cancer Mother 68    pancreatic cancer  . Cancer Sister 70    breast cancer  . Cancer Paternal Aunt     breast  . Cancer Sister     History  Substance Use Topics  . Smoking status: Never Smoker   . Smokeless tobacco: Never Used  . Alcohol Use: 2.5 oz/week     5 drink(s) per week     Comment: "    OB History   Grav Para Term Preterm Abortions TAB SAB Ect Mult Living                  Review of Systems  All other systems reviewed and are negative.    Allergies  Epinephrine and Procaine hcl  Home Medications   Current Outpatient Rx  Name  Route  Sig  Dispense  Refill  . ASTAXANTHIN PO   Oral   Take 1 tablet by mouth daily.         Marland Kitchen CALCIUM & MAGNESIUM CARBONATES PO   Oral   Take 1,500 mg by mouth daily.           . Cholecalciferol (VITAMIN D3) 5000 UNITS TABS   Oral   Take 5,000 mg by mouth 3 (three) times a week.         . fish oil-omega-3 fatty acids 1000 MG capsule   Oral   Take 2 g by mouth daily.         . Glucosamine-Chondroitin (JOINT PAIN FORMULA PO)   Oral   Take 2 capsules by mouth daily.         Marland Kitchen  Multiple Vitamins-Minerals (MULTIVITAMIN WITH MINERALS) tablet   Oral   Take 1 tablet by mouth daily.           . non-metallic deodorant Thornton Papas) MISC   Topical   Apply 1 application topically daily as needed.         Marland Kitchen PROGESTERONE MICRONIZED PO   Oral   Take 1 capsule by mouth daily.         Marland Kitchen RESVERATROL PO   Oral   Take 1-2 capsules by mouth daily.         . Turmeric 500 MG CAPS   Oral   Take 1 capsule by mouth daily.           BP 122/88  Pulse 63  Temp(Src) 97.5 F (36.4 C) (Oral)  Resp 16  SpO2 100%  Physical Exam  Nursing note and vitals reviewed. Constitutional: She is oriented to person, place, and time. She appears well-developed and well-nourished.  Non-toxic appearance. No distress.  HENT:  Head: Normocephalic and atraumatic.  Eyes: Conjunctivae, EOM and lids are normal. Pupils are equal, round, and reactive to light.  Neck: Normal range of motion. Neck supple. No tracheal deviation present. No mass present.  Cardiovascular: Normal rate, regular rhythm and normal heart sounds.  Exam reveals no gallop.   No murmur heard. Pulmonary/Chest: Effort normal and  breath sounds normal. No stridor. No respiratory distress. She has no decreased breath sounds. She has no wheezes. She has no rhonchi. She has no rales.  Abdominal: Soft. Normal appearance and bowel sounds are normal. She exhibits no distension. There is no tenderness. There is no rebound and no CVA tenderness.  Musculoskeletal: Normal range of motion. She exhibits no edema and no tenderness.  Neurological: She is alert and oriented to person, place, and time. She has normal strength. She displays no tremor. No cranial nerve deficit or sensory deficit. She displays no seizure activity. Gait abnormal. Coordination normal. GCS eye subscore is 4. GCS verbal subscore is 5. GCS motor subscore is 6.  Horizontal nystagmus noted  Skin: Skin is warm and dry. No abrasion and no rash noted.  Psychiatric: She has a normal mood and affect. Her speech is normal and behavior is normal.    ED Course  Procedures (including critical care time)  Labs Reviewed - No data to display No results found.   No diagnosis found.    MDM  Pt given decadron for the edema around the tumor and antivert for the dizziness--spoke with neurosurgeon and reccomends decadron--pt doesn't want to be admitted--I will arrange outpt f/u with dr. Wynetta Emery ( N/S)--dizzness has improved and neuro exam stable        Toy Baker, MD 02/26/13 [phone]

## 2013-02-26 NOTE — Consult Note (Signed)
Reason for Consult: Brain tumor Referring Physician: Emergency department Dr. Bruce Donath  Krista Dunn is an 72 y.o. female.  HPI: Patient is a very pleasant 72 year old female who has had long-standing history of occasional pain is in the back of her right hip and she was described as a headache a little pain in her head. It's not associated with any nausea vomiting or blood busy over last week associate couple episodes of dizziness and this is been more lightheadedness number ago she has had a history of vertigo in the past. The lightheadedness gets the point where this morning she will Compazine is out of bed and collapsed was feeling very unsteady and dizzy when she got up after that and was brought to the ER. Throughout the week she's had episodes of some pain or ear and occasional lightheadedness. She does have a history of breast cancer diagnosed 2 years ago underwent a surgery and sentinel lymph node dissection is been apparently in remission cancer free followed by Dr. Welton Flakes oncology.  Past Medical History  Diagnosis Date  . Breast cancer 09/13/2011  . Neck pain   . Liver hemangioma     been follow on CT scan since 2008  . Arthritis     back  . Complication of anesthesia     hard to wake up-goes out fast  . Allergy     dilantin =rash    Past Surgical History  Procedure Laterality Date  . Right foot surgery      toe implant  . Colonoscopy    . Breast surgery  1988    rt br bx-negative  . Lymph node biopsy  10/14/2011    Procedure: LYMPH NODE BIOPSY;  Surgeon: Mariella Saa, MD;  [facility];  Service: General;  Laterality: Right;  . Abdominal hysterectomy      Family History  Problem Relation Age of Onset  . Cancer Maternal Grandmother 60    brain cancer  . Cancer Father 80    colon cancer  . Cancer Mother 44    pancreatic cancer  . Cancer Sister 72    breast cancer  . Cancer Paternal Aunt     breast  . Cancer Sister     Social  History:  reports that she has never smoked. She has never used smokeless tobacco. She reports that she drinks about 2.5 ounces of alcohol per week. She reports that she does not use illicit drugs.  Allergies:  Allergies  Allergen Reactions  . Epinephrine     REACTION: rapid heart rate  . Procaine Hcl     REACTION: rapid heart rate, heart skips beat    Medications: I have reviewed the patient's current medications.  Results for orders placed during the hospital encounter of 02/26/13 (from the past 48 hour(s))  CBC WITH DIFFERENTIAL     Status: None   Collection Time    02/26/13 12:55 PM      Result Value Range   WBC 4.8  4.0 - 10.5 K/uL   RBC 4.37  3.87 - 5.11 MIL/uL   Hemoglobin 13.9  12.0 - 15.0 g/dL   HCT 40.9  81.1 - 91.4 %   MCV 91.5  78.0 - 100.0 fL   MCH 31.8  26.0 - 34.0 pg   MCHC 34.8  30.0 - 36.0 g/dL   RDW 78.2  95.6 - 21.3 %   Platelets 233  150 - 400 K/uL   Neutrophils Relative 73  43 -  77 %   Neutro Abs 3.5  1.7 - 7.7 K/uL   Lymphocytes Relative 21  12 - 46 %   Lymphs Abs 1.0  0.7 - 4.0 K/uL   Monocytes Relative 4  3 - 12 %   Monocytes Absolute 0.2  0.1 - 1.0 K/uL   Eosinophils Relative 2  0 - 5 %   Eosinophils Absolute 0.1  0.0 - 0.7 K/uL   Basophils Relative 0  0 - 1 %   Basophils Absolute 0.0  0.0 - 0.1 K/uL  COMPREHENSIVE METABOLIC PANEL     Status: Abnormal   Collection Time    02/26/13 12:55 PM      Result Value Range   Sodium 138  135 - 145 mEq/L   Potassium 4.2  3.5 - 5.1 mEq/L   Chloride 104  96 - 112 mEq/L   CO2 26  19 - 32 mEq/L   Glucose, Bld 100 (*) 70 - 99 mg/dL   BUN 10  6 - 23 mg/dL   Creatinine, Ser 1.61  0.50 - 1.10 mg/dL   Calcium 9.8  8.4 - 09.6 mg/dL   Total Protein 6.9  6.0 - 8.3 g/dL   Albumin 3.9  3.5 - 5.2 g/dL   AST 32  0 - 37 U/L   ALT 34  0 - 35 U/L   Alkaline Phosphatase 82  39 - 117 U/L   Total Bilirubin 0.7  0.3 - 1.2 mg/dL   GFR calc non Af Amer 87 (*) >90 mL/min   GFR calc Af Amer >90  >90 mL/min   Comment:             The eGFR has been calculated     using the CKD EPI equation.     This calculation has not been     validated in all clinical     situations.     eGFR's persistently     <90 mL/min signify     possible Chronic Kidney Disease.  POCT I-STAT, CHEM 8     Status: Abnormal   Collection Time    02/26/13  1:02 PM      Result Value Range   Sodium 143  135 - 145 mEq/L   Potassium 4.2  3.5 - 5.1 mEq/L   Chloride 108  96 - 112 mEq/L   BUN 10  6 - 23 mg/dL   Creatinine, Ser 0.45  0.50 - 1.10 mg/dL   Glucose, Bld 409 (*) 70 - 99 mg/dL   Calcium, Ion 8.11  9.14 - 1.30 mmol/L   TCO2 25  0 - 100 mmol/L   Hemoglobin 14.3  12.0 - 15.0 g/dL   HCT 78.2  95.6 - 21.3 %    Mr Laqueta Jean Wo Contrast  02/26/2013  *RADIOLOGY REPORT*  Clinical Data: 72 year old female with breast cancer diagnosed in 2012.  Dizziness, headache, weakness.  MRI HEAD WITHOUT AND WITH CONTRAST  Technique:  Multiplanar, multiecho pulse sequences of the brain and surrounding structures were obtained according to standard protocol without and with intravenous contrast  Contrast: 16mL MULTIHANCE GADOBENATE DIMEGLUMINE 529 MG/ML IV SOLN  Comparison: None.  Findings: There is a large anterior cranial fossa/sphenoid planum homogeneously enhancing mass measuring 38 x 37 x 34 mm (AP by transverse by CC).  There is mass effect on the brain with cerebral edema.  However, coronal T2 images suggest the masses extra-axial (series 9 image 19 and 20).  Furthermore it has an internal vascular pattern most suggestive  of meningioma (spoke-wheel series 5 image 9).  The mass also shows some restricted diffusion commonly seen in meningioma.  There does appear to be a right supraorbital dural tail (series 11 image 21).  No other suspicious intracranial enhancement or enhancing mass identified.  Mass effect on the right lateral ventricle.  No ventriculomegaly. No parenchymal restricted diffusion to suggest acute infarct. No acute intracranial hemorrhage  identified.  The mass does not appear to be significantly mineralized. Major intracranial vascular flow voids are preserved.  There is mass effect on the ACA branches. Scattered subcortical white matter T2 and FLAIR hyperintensity in the brain is nonspecific and mild for age.  Deep gray matter nuclei, brainstem, cerebellum, and visualized cervical spine are within normal limits.  Normal bone marrow signal.  Visualized orbit soft tissues are within normal limits.  Visualized paranasal sinuses and mastoids are clear.  IMPRESSION: 1.  Large 3-4 cm right anterior cranial fossa/sphenoid planum enhancing mass.  Mass effect on the brain and cerebral edema. 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 2.  No ventriculomegaly or impending herniation.  No other acute intracranial abnormality.  Study discussed by telephone with Dr. Lorre Nick at 1505 hours on 02/26/2013.   Original Report Authenticated By: Erskine Speed, M.D.     Review of Systems  Constitutional: Negative.   HENT: Positive for ear pain.   Eyes: Negative.   Respiratory: Negative.   Cardiovascular: Negative.   Gastrointestinal: Negative.   Genitourinary: Negative.   Musculoskeletal: Negative.   Skin: Negative.   Neurological: Positive for dizziness and headaches.  Endo/Heme/Allergies: Negative.   Psychiatric/Behavioral: Negative.    Blood pressure 122/88, pulse 63, temperature 97.5 F (36.4 C), temperature source Oral, resp. rate 16, SpO2 100.00%. Physical Exam  Constitutional: She is oriented to person, place, and time. She appears well-developed and well-nourished.  HENT:  Head: Normocephalic and atraumatic.  Eyes: Pupils are equal, round, and reactive to light.  Neck: Normal range of motion.  Cardiovascular: Normal rate.   Respiratory: Effort normal.  GI: Soft.  Musculoskeletal: Normal range of motion.  Neurological: She is alert  and oriented to person, place, and time. She has normal strength. GCS eye subscore is 4. GCS verbal subscore is 5. GCS motor subscore is 6.  Reflex Scores:      Tricep reflexes are 2+ on the right side and 2+ on the left side.      Bicep reflexes are 2+ on the right side and 2+ on the left side.      Brachioradialis reflexes are 2+ on the right side and 2+ on the left side.      Patellar reflexes are 2+ on the right side and 2+ on the left side.      Achilles reflexes are 2+ on the right side and 2+ on the left side. Patient has 5 out of 5 strength in her deltoids, biceps, triceps, wrist flexion, wrist extension, and intrinsics. Lower extremity he strength is also 5 out of 5 in her iliopsoas, hip she's, gastrocs, anterior tibialis, and EHL. Cerebellar exam is normal with no signs of dysmetria or dysdiadochokinesia     Assessment/Plan: 72 year old female presents with a right orbital frontal mass it imaging characteristics consistent with meningioma very unlikely to be a breast met but possible it is causing some local mass effect and some mild amount of edema around it she is significantly improved on the antevert she  was given in the ER and she also was given Decadron. She'll be discharged home on a tapering dose of Decadron with followup next week she will need surgery on this at some point the timing of which we will discuss her followup visit. I think she should continue Decadron recommended her taking antacid while she's on the Decadron and they'll follow her up on either Tuesday or Thursday of next week  Tamario Heal P 02/26/2013, 4:21 PM

## 2013-02-26 NOTE — ED Notes (Signed)
Neuro MD at bedside

## 2013-02-26 NOTE — ED Notes (Signed)
Pt escorted to discharge window. Verbalized understanding discharge instructions. In no acute distress.   

## 2013-02-26 NOTE — ED Notes (Signed)
Patient transported to MRI 

## 2013-03-11 DIAGNOSIS — D320 Benign neoplasm of cerebral meninges: Secondary | ICD-10-CM | POA: Insufficient documentation

## 2013-04-28 ENCOUNTER — Telehealth: Payer: Self-pay | Admitting: Oncology

## 2013-04-30 ENCOUNTER — Other Ambulatory Visit: Payer: Medicare Other | Admitting: Lab

## 2013-04-30 ENCOUNTER — Ambulatory Visit: Payer: Medicare Other | Admitting: Oncology

## 2013-05-10 ENCOUNTER — Telehealth: Payer: Self-pay | Admitting: Oncology

## 2013-05-10 NOTE — Telephone Encounter (Signed)
, °

## 2013-06-03 ENCOUNTER — Telehealth: Payer: Self-pay | Admitting: Oncology

## 2013-06-03 NOTE — Telephone Encounter (Signed)
KK PAL moved from 7/28 to 7/29 - date per KK. S/w pt she is aware. °

## 2013-06-07 ENCOUNTER — Other Ambulatory Visit: Payer: Medicare Other | Admitting: Lab

## 2013-06-07 ENCOUNTER — Ambulatory Visit: Payer: Medicare Other | Admitting: Oncology

## 2013-06-08 ENCOUNTER — Telehealth: Payer: Self-pay | Admitting: Oncology

## 2013-06-08 ENCOUNTER — Ambulatory Visit (HOSPITAL_BASED_OUTPATIENT_CLINIC_OR_DEPARTMENT_OTHER): Payer: Medicare Other | Admitting: Oncology

## 2013-06-08 ENCOUNTER — Encounter: Payer: Self-pay | Admitting: Oncology

## 2013-06-08 ENCOUNTER — Other Ambulatory Visit (HOSPITAL_BASED_OUTPATIENT_CLINIC_OR_DEPARTMENT_OTHER): Payer: Medicare Other | Admitting: Lab

## 2013-06-08 VITALS — BP 137/76 | HR 91 | Temp 98.4°F | Resp 20 | Ht 65.0 in | Wt 187.0 lb

## 2013-06-08 DIAGNOSIS — C50919 Malignant neoplasm of unspecified site of unspecified female breast: Secondary | ICD-10-CM

## 2013-06-08 LAB — COMPREHENSIVE METABOLIC PANEL (CC13)
ALT: 30 U/L (ref 0–55)
AST: 27 U/L (ref 5–34)
Alkaline Phosphatase: 86 U/L (ref 40–150)
Glucose: 108 mg/dl (ref 70–140)
Potassium: 4.2 mEq/L (ref 3.5–5.1)
Sodium: 143 mEq/L (ref 136–145)
Total Bilirubin: 0.46 mg/dL (ref 0.20–1.20)
Total Protein: 6.8 g/dL (ref 6.4–8.3)

## 2013-06-08 LAB — CBC WITH DIFFERENTIAL/PLATELET
BASO%: 0.7 % (ref 0.0–2.0)
EOS%: 4.3 % (ref 0.0–7.0)
LYMPH%: 29.9 % (ref 14.0–49.7)
MCHC: 33.9 g/dL (ref 31.5–36.0)
MCV: 92.8 fL (ref 79.5–101.0)
MONO%: 6.4 % (ref 0.0–14.0)
Platelets: 240 10*3/uL (ref 145–400)
RBC: 4.19 10*6/uL (ref 3.70–5.45)
RDW: 13.9 % (ref 11.2–14.5)

## 2013-06-08 NOTE — Patient Instructions (Addendum)
Stop progesterone pills  We will get a chest x-ray for the pain you are expereincing in the anterior chest  Try vitamin E and periden C for hot flashes and night sweats  I will see you back in 6 months

## 2013-06-08 NOTE — Progress Notes (Signed)
OFFICE PROGRESS NOTE  CC  Dr. Glenna Fellows Dr. Algie Coffer Dr. Chipper Herb   DIAGNOSIS:  72 yo with: 1. diagnosis of invasive ductal carcinoma of the right breast. 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 T20mic,N0,M0  PRIOR THERAPY: 1. S/P lumpectomy on 10/14/11 for right IDC measuring 0.11 cm ER+98%, PR+85%, Her2Neu negative, ki-67 11% (T69mic, N0)  2. Genetic counseling and testing performed on 11/27 for the comprehensive BRCA1 and BRCA2 mutation negative  3. S/P radiation therapy to the right breast completed on 01/21/12  4. Patient decided to try arimidex 1 mg daily starting 04/01/12 however she developed hives and this was discontinued about a month ago.  #5 patient will now begin Aromasin 25 mg starting  08/03/2012.patient discontinued the Aromasin. And she began progesterone therapy under the direction of an complimentary medicine physician. I have recommended she discontinue the progesterone pills.patient does not want to go back on adjuvant antiestrogen therapy at this time due to side effects.  CURRENT THERAPY: observation  INTERVAL HISTORY: Krista Dunn 71 y.o. female returns for follow up visit.Overall she's doing well she is tolerating Aromasin better. She has no nausea vomiting no fevers chills however she is concerned about her weight. She is eating a lot more. We discussed exercise and eating healthy habits today extensively. She has no nausea vomiting no myalgias and arthralgias remainder of the 10 point review of systems is negative.  MEDICAL HISTORY: Past Medical History  Diagnosis Date  . Breast cancer 09/13/2011  . Neck pain   . Liver hemangioma     been follow on CT scan since 2008  . Arthritis     back  . Complication of anesthesia     hard to wake up-goes out fast  . Allergy     dilantin =rash    ALLERGIES:  is allergic to epinephrine and procaine hcl.  MEDICATIONS:   Current Outpatient Prescriptions  Medication Sig Dispense Refill  . ASTAXANTHIN PO Take 1 tablet by mouth daily.      Marland Kitchen CALCIUM & MAGNESIUM CARBONATES PO Take 1,500 mg by mouth daily.        . Cholecalciferol (VITAMIN D3) 5000 UNITS TABS Take 5,000 mg by mouth 3 (three) times a week.      . fish oil-omega-3 fatty acids 1000 MG capsule Take 2 g by mouth daily.      . Glucosamine-Chondroitin (JOINT PAIN FORMULA PO) Take 2 capsules by mouth daily.      . Multiple Vitamins-Minerals (MULTIVITAMIN WITH MINERALS) tablet Take 1 tablet by mouth daily.        Marland Kitchen PROGESTERONE MICRONIZED PO Take 1 capsule by mouth daily.      Marland Kitchen RESVERATROL PO Take 1-2 capsules by mouth daily.      . THYROID PO Take by mouth.      . Turmeric 500 MG CAPS Take 1 capsule by mouth daily.      . meclizine (ANTIVERT) 25 MG tablet Take 1 tablet (25 mg total) by mouth 4 (four) times daily.  28 tablet  0  . non-metallic deodorant (ALRA) MISC Apply 1 application topically daily as needed.       No current facility-administered medications for this visit.    SURGICAL HISTORY:  Past Surgical History  Procedure Laterality Date  . Right foot surgery      toe implant  . Colonoscopy    . Breast surgery  1988  rt br bx-negative  . Lymph node biopsy  10/14/2011    Procedure: LYMPH NODE BIOPSY;  Surgeon: Mariella Saa, MD;  Location: [facility];  Service: General;  Laterality: Right;  . Abdominal hysterectomy      REVIEW OF SYSTEMS:    Patient is alert oriented she is in no acute distress She denies any fevers chills she does have night sweats and hot flashes. She is concerned about her weight she has no myalgias and arthralgias no diarrhea or constipation remainder of the 10 point review of systems is negative her PHYSICAL EXAMINATION:  HEENT exam EOMI PERRLA sclerae anicteric no conjunctival pallor oral mucosa is moist neck is supple lungs are clear cardiovascular regular rate rhythm no murmurs gallops or  rubs abdomen is soft nontender nondistended bowel sounds are present noticed them extremities no edema neuro patient's alert oriented otherwise nonfocal Bilateral Breast Examination: left breast no masses, nipple discharge or retraction; Right breast healing lumpectomy scar, no nipple discharge, redness is noted  ECOG PERFORMANCE STATUS: 1 - Symptomatic but completely ambulatory  Blood pressure 137/76, pulse 91, temperature 98.4 F (36.9 C), temperature source Oral, resp. rate 20, height 5\' 5"  (1.651 m), weight 187 lb (84.823 kg).  LABORATORY DATA: Lab Results  Component Value Date   WBC 4.2 06/08/2013   HGB 13.2 06/08/2013   HCT 38.9 06/08/2013   MCV 92.8 06/08/2013   PLT 240 06/08/2013      Chemistry      Component Value Date/Time   NA 143 06/08/2013 1440   NA 143 02/26/2013 1302   K 4.2 06/08/2013 1440   K 4.2 02/26/2013 1302   CL 108 02/26/2013 1302   CL 108* 10/23/2012 1533   CO2 23 06/08/2013 1440   CO2 26 02/26/2013 1255   BUN 12.2 06/08/2013 1440   BUN 10 02/26/2013 1302   CREATININE 0.8 06/08/2013 1440   CREATININE 0.70 02/26/2013 1302      Component Value Date/Time   CALCIUM 9.5 06/08/2013 1440   CALCIUM 9.8 02/26/2013 1255   ALKPHOS 86 06/08/2013 1440   ALKPHOS 82 02/26/2013 1255   AST 27 06/08/2013 1440   AST 32 02/26/2013 1255   ALT 30 06/08/2013 1440   ALT 34 02/26/2013 1255   BILITOT 0.46 06/08/2013 1440   BILITOT 0.7 02/26/2013 1255       RADIOGRAPHIC STUDIES:  Nm Sentinel Node Inj-no Rpt (breast)  10/14/2011  CLINICAL DATA: right breast mass   Sulfur colloid was injected intradermally by the nuclear medicine  technologist for breast cancer sentinel node localization.      ASSESSMENT: 72 year old female with:  1.  stage I invasive ductal carcinoma, Er+PR+ Her2 Neu negative. S/P lumpectomy of right breast,   2. S/P radiation therapy completed on 01/23/12  #3 patient was begun on Arimidex unfortunate she developed hives. We again discussed anti-estrogen therapy today.  She and I discussed the possibility of doing Aromasin 25 mg daily risks and benefits and literature were given to the patient.   #4 patient has been seeing a complimentary medicine person and she was placed on progesterone pills. I have discussed this with the patient have recommended that she discontinue these since her tumor was ER positive and PR positive.  PLAN:  #1 stop progesterone pills.  #2 we'll get a chest x-ray for the pain she is experiencing the anterior chest.  #3 she will try vitamin E. And 13 see for hot flashes and night sweats.  #4 I  will see the patient back in 6 months time for followup.  All questions were answered. The patient knows to call the clinic with any problems, questions or concerns. We can certainly see the patient much sooner if necessary.  I spent 25 minutes counseling the patient face to face. The total time spent in the appointment was 30 minutes.    Drue Second, MD Medical/Oncology [facility] [phone] (beeper) [phone] (Office)  06/08/2013, 3:27 PM

## 2013-12-08 ENCOUNTER — Other Ambulatory Visit: Payer: Self-pay | Admitting: Emergency Medicine

## 2013-12-08 DIAGNOSIS — C50919 Malignant neoplasm of unspecified site of unspecified female breast: Secondary | ICD-10-CM

## 2013-12-09 ENCOUNTER — Encounter: Payer: Self-pay | Admitting: Oncology

## 2013-12-09 ENCOUNTER — Encounter (INDEPENDENT_AMBULATORY_CARE_PROVIDER_SITE_OTHER): Payer: Self-pay

## 2013-12-09 ENCOUNTER — Telehealth: Payer: Self-pay | Admitting: Oncology

## 2013-12-09 ENCOUNTER — Other Ambulatory Visit (HOSPITAL_BASED_OUTPATIENT_CLINIC_OR_DEPARTMENT_OTHER): Payer: Medicare Other

## 2013-12-09 ENCOUNTER — Ambulatory Visit (HOSPITAL_BASED_OUTPATIENT_CLINIC_OR_DEPARTMENT_OTHER): Payer: Medicare Other | Admitting: Oncology

## 2013-12-09 VITALS — BP 138/71 | HR 80 | Temp 97.9°F | Resp 18 | Wt 166.9 lb

## 2013-12-09 DIAGNOSIS — C50119 Malignant neoplasm of central portion of unspecified female breast: Secondary | ICD-10-CM

## 2013-12-09 DIAGNOSIS — Z170 Estrogen receptor positive status [ER+]: Secondary | ICD-10-CM

## 2013-12-09 DIAGNOSIS — C50919 Malignant neoplasm of unspecified site of unspecified female breast: Secondary | ICD-10-CM

## 2013-12-09 LAB — CBC WITH DIFFERENTIAL/PLATELET
BASO%: 0.6 % (ref 0.0–2.0)
BASOS ABS: 0 10*3/uL (ref 0.0–0.1)
EOS%: 2.9 % (ref 0.0–7.0)
Eosinophils Absolute: 0.1 10*3/uL (ref 0.0–0.5)
HEMATOCRIT: 41.8 % (ref 34.8–46.6)
HEMOGLOBIN: 14 g/dL (ref 11.6–15.9)
LYMPH#: 1.2 10*3/uL (ref 0.9–3.3)
LYMPH%: 26.3 % (ref 14.0–49.7)
MCH: 31.4 pg (ref 25.1–34.0)
MCHC: 33.4 g/dL (ref 31.5–36.0)
MCV: 94.1 fL (ref 79.5–101.0)
MONO#: 0.3 10*3/uL (ref 0.1–0.9)
MONO%: 5.7 % (ref 0.0–14.0)
NEUT#: 3.1 10*3/uL (ref 1.5–6.5)
NEUT%: 64.5 % (ref 38.4–76.8)
Platelets: 273 10*3/uL (ref 145–400)
RBC: 4.45 10*6/uL (ref 3.70–5.45)
RDW: 13.9 % (ref 11.2–14.5)
WBC: 4.7 10*3/uL (ref 3.9–10.3)

## 2013-12-09 LAB — COMPREHENSIVE METABOLIC PANEL (CC13)
ALBUMIN: 4.3 g/dL (ref 3.5–5.0)
ALT: 40 U/L (ref 0–55)
ANION GAP: 12 meq/L — AB (ref 3–11)
AST: 37 U/L — AB (ref 5–34)
Alkaline Phosphatase: 117 U/L (ref 40–150)
BUN: 15.3 mg/dL (ref 7.0–26.0)
CALCIUM: 9.9 mg/dL (ref 8.4–10.4)
CHLORIDE: 104 meq/L (ref 98–109)
CO2: 25 meq/L (ref 22–29)
CREATININE: 0.8 mg/dL (ref 0.6–1.1)
Glucose: 102 mg/dl (ref 70–140)
POTASSIUM: 4.1 meq/L (ref 3.5–5.1)
Sodium: 141 mEq/L (ref 136–145)
Total Bilirubin: 1.29 mg/dL — ABNORMAL HIGH (ref 0.20–1.20)
Total Protein: 7.2 g/dL (ref 6.4–8.3)

## 2013-12-09 NOTE — Progress Notes (Signed)
OFFICE PROGRESS NOTE  CC  Dr. Excell Seltzer Dr. Valentino Saxon Dr. Arloa Koh   DIAGNOSIS:  73 yo with: 1. diagnosis of invasive ductal carcinoma of the right breast. 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 T22mc,N0,M0  PRIOR THERAPY: 1. S/P lumpectomy on 10/14/11 for right IDC measuring 0.11 cm ER+98%, PR+85%, Her2Neu negative, ki-67 11% (T173m, N0)  2. Genetic counseling and testing performed on 11/27 for the comprehensive BRCA1 and BRCA2 mutation negative  3. S/P radiation therapy to the right breast completed on 01/21/12  4. Patient decided to try arimidex 1 mg daily starting 04/01/12 however she developed hives and this was discontinued about a month ago.  #5 patient will now begin Aromasin 25 mg starting  08/03/2012.patient discontinued the Aromasin. And she began progesterone therapy under the direction of an complimentary medicine physician. I have recommended she discontinue the progesterone pills.patient does not want to go back on adjuvant antiestrogen therapy at this time due to side effects.  CURRENT THERAPY: observation  INTERVAL HISTORY: [name].o. female returns for follow up visit. She has no nausea vomiting no fevers chills however she is concerned about her weight. She is eating a lot more. We discussed exercise and eating healthy habits today extensively. She has no nausea vomiting no myalgias and arthralgias remainder of the 10 point review of systems is negative.  MEDICAL HISTORY: Past Medical History  Diagnosis Date  . Breast cancer 09/13/2011  . Neck pain   . Liver hemangioma     been follow on CT scan since 2008  . Arthritis     back  . Complication of anesthesia     hard to wake up-goes out fast  . Allergy     dilantin =rash    ALLERGIES:  is allergic to epinephrine and procaine hcl.  MEDICATIONS:  Current Outpatient Prescriptions  Medication Sig Dispense  Refill  . ASTAXANTHIN PO Take 1 tablet by mouth daily.      . Marland KitchenALCIUM & MAGNESIUM CARBONATES PO Take 1,500 mg by mouth daily.        . Cholecalciferol (VITAMIN D3) 5000 UNITS TABS Take 5,000 mg by mouth 3 (three) times a week.      . fish oil-omega-3 fatty acids 1000 MG capsule Take 2 g by mouth daily.      . Glucosamine-Chondroitin (JOINT PAIN FORMULA PO) Take 2 capsules by mouth daily.      . meclizine (ANTIVERT) 25 MG tablet Take 1 tablet (25 mg total) by mouth 4 (four) times daily.  28 tablet  0  . Multiple Vitamins-Minerals (MULTIVITAMIN WITH MINERALS) tablet Take 1 tablet by mouth daily.        . non-metallic deodorant (AJethro PolingMISC Apply 1 application topically daily as needed.      . phentermine 30 MG capsule       . PROGESTERONE MICRONIZED PO Take 1 capsule by mouth daily.      . Marland KitchenESVERATROL PO Take 1-2 capsules by mouth daily.      . THYROID PO Take by mouth.      . Turmeric 500 MG CAPS Take 1 capsule by mouth daily.       No current facility-administered medications for this visit.    SURGICAL HISTORY:  Past Surgical History  Procedure Laterality Date  . Right foot surgery      toe implant  . Colonoscopy    . Breast surgery  1988    rt br bx-negative  . Lymph node biopsy  10/14/2011    Procedure: LYMPH NODE BIOPSY;  Surgeon: Edward Jolly, MD;  Location: [facility];  Service: General;  Laterality: Right;  . Abdominal hysterectomy      REVIEW OF SYSTEMS:    Patient is alert oriented she is in no acute distress She denies any fevers chills she does have night sweats and hot flashes. She is concerned about her weight she has no myalgias and arthralgias no diarrhea or constipation remainder of the 10 point review of systems is negative her PHYSICAL EXAMINATION:  HEENT exam EOMI PERRLA sclerae anicteric no conjunctival pallor oral mucosa is moist neck is supple lungs are clear cardiovascular regular rate rhythm no murmurs gallops or rubs abdomen is soft  nontender nondistended bowel sounds are present noticed them extremities no edema neuro patient's alert oriented otherwise nonfocal Bilateral Breast Examination: left breast no masses, nipple discharge or retraction; Right breast healing lumpectomy scar, no nipple discharge, redness is noted  ECOG PERFORMANCE STATUS: 1 - Symptomatic but completely ambulatory  Blood pressure 138/71, pulse 80, temperature 97.9 F (36.6 C), temperature source Oral, resp. rate 18, weight 166 lb 14.4 oz (75.705 kg).  LABORATORY DATA: Lab Results  Component Value Date   WBC 4.7 12/09/2013   HGB 14.0 12/09/2013   HCT 41.8 12/09/2013   MCV 94.1 12/09/2013   PLT 273 12/09/2013      Chemistry      Component Value Date/Time   NA 143 06/08/2013 1440   NA 143 02/26/2013 1302   K 4.2 06/08/2013 1440   K 4.2 02/26/2013 1302   CL 108 02/26/2013 1302   CL 108* 10/23/2012 1533   CO2 23 06/08/2013 1440   CO2 26 02/26/2013 1255   BUN 12.2 06/08/2013 1440   BUN 10 02/26/2013 1302   CREATININE 0.8 06/08/2013 1440   CREATININE 0.70 02/26/2013 1302      Component Value Date/Time   CALCIUM 9.5 06/08/2013 1440   CALCIUM 9.8 02/26/2013 1255   ALKPHOS 86 06/08/2013 1440   ALKPHOS 82 02/26/2013 1255   AST 27 06/08/2013 1440   AST 32 02/26/2013 1255   ALT 30 06/08/2013 1440   ALT 34 02/26/2013 1255   BILITOT 0.46 06/08/2013 1440   BILITOT 0.7 02/26/2013 1255       RADIOGRAPHIC STUDIES:  Nm Sentinel Node Inj-no Rpt (breast)  10/14/2011  CLINICAL DATA: right breast mass   Sulfur colloid was injected intradermally by the nuclear medicine  technologist for breast cancer sentinel node localization.      ASSESSMENT/PLAN: 73 year old female with:  1.  stage I invasive ductal carcinoma, Er+PR+ Her2 Neu negative. S/P lumpectomy of right breast,   2. S/P radiation therapy completed on 01/23/12  #3 patient was begun on Arimidex unfortunate she developed hives. We again discussed anti-estrogen therapy today. She and I discussed the  possibility of doing Aromasin 25 mg daily risks and benefits and literature were given to the patient.   #4 patient has been seeing a complimentary medicine person and she was placed on progesterone pills. I have discussed this with the patient have recommended that she discontinue these since her tumor was ER positive and PR positive.  #5 patient was recommended to have mammograms performed on an annual basis. However she is doing MR scan and she is afraid of radiation therapy in having her breast compressed 4 the mammograms.  All questions were answered. The patient  knows to call the clinic with any problems, questions or concerns. We can certainly see the patient much sooner if necessary.  I spent 25 minutes counseling the patient face to face. The total time spent in the appointment was 30 minutes.    Marcy Panning, MD Medical/Oncology [facility] [phone] (beeper) [phone] (Office)  12/09/2013, 2:50 PM

## 2013-12-09 NOTE — Telephone Encounter (Signed)
, °

## 2014-01-17 ENCOUNTER — Other Ambulatory Visit: Payer: Self-pay | Admitting: Nurse Practitioner

## 2014-01-17 DIAGNOSIS — Z853 Personal history of malignant neoplasm of breast: Secondary | ICD-10-CM

## 2014-01-25 ENCOUNTER — Other Ambulatory Visit: Payer: Medicare Other

## 2014-01-28 ENCOUNTER — Ambulatory Visit (INDEPENDENT_AMBULATORY_CARE_PROVIDER_SITE_OTHER): Payer: Medicare Other | Admitting: Podiatrist

## 2014-01-28 ENCOUNTER — Encounter: Payer: Self-pay | Admitting: Podiatrist

## 2014-01-28 VITALS — BP 134/71 | HR 74 | Resp 16

## 2014-01-28 DIAGNOSIS — M205X2 Other deformities of toe(s) (acquired), left foot: Secondary | ICD-10-CM

## 2014-01-28 DIAGNOSIS — M205X9 Other deformities of toe(s) (acquired), unspecified foot: Secondary | ICD-10-CM

## 2014-01-28 MED ORDER — DICLOFENAC SODIUM 1 % TD GEL: 2.0000 g | Freq: Four times a day (QID) | TRANSDERMAL | Status: DC

## 2014-01-28 NOTE — Progress Notes (Signed)
Subjective: Krista Dunn presents today for continued pain at the first metatarsophalangeal joint of the left foot. She states the last injection I performed in August of 2014 last it for very long time and she would like to know if she can get another one in this joint. She also states that she feels popping and clicking in her right great toe joint status post implant at the base of the proximal phalanx. She relates it's not painful at all times but it does give her some discomfort at times.  Objective: Vascular status is intact pulses palpable at 2/4 DP and PT bilateral. Range of motion of the first metatarsophalangeal joint is definitely improved from prior to surgery. There is some slight crepitus noted however this again is normal for the amount of arthritis that she presented with. The left first metatarsophalangeal joint does have range of motion but she does have pain and range of motion and within the joint itself.  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  Plan: Injected the left great toe joint with Kenalog and Marcaine mixture under sterile technique. I can also do this on the right if she would like in the future. We did discuss swapping are implant for a Silastic implant versus a fusion of the great toe joint. However at this time encouraged her to wait until it became significantly uncomfortable and hard to walk. I will see her back as needed for followup.

## 2014-05-19 ENCOUNTER — Telehealth: Payer: Self-pay | Admitting: Hematology and Oncology

## 2014-05-19 NOTE — Telephone Encounter (Signed)
, °

## 2014-06-08 ENCOUNTER — Other Ambulatory Visit: Payer: Medicare Other

## 2014-06-08 ENCOUNTER — Ambulatory Visit: Payer: Medicare Other | Admitting: Oncology

## 2014-06-20 ENCOUNTER — Other Ambulatory Visit (HOSPITAL_BASED_OUTPATIENT_CLINIC_OR_DEPARTMENT_OTHER): Payer: Medicare Other

## 2014-06-20 ENCOUNTER — Other Ambulatory Visit: Payer: Self-pay | Admitting: *Deleted

## 2014-06-20 ENCOUNTER — Ambulatory Visit (HOSPITAL_BASED_OUTPATIENT_CLINIC_OR_DEPARTMENT_OTHER): Payer: Medicare Other | Admitting: Adult Health

## 2014-06-20 ENCOUNTER — Telehealth: Payer: Self-pay | Admitting: Adult Health

## 2014-06-20 ENCOUNTER — Encounter: Payer: Self-pay | Admitting: Adult Health

## 2014-06-20 VITALS — BP 160/86 | HR 85 | Temp 98.2°F | Resp 20 | Ht 65.0 in | Wt 156.3 lb

## 2014-06-20 DIAGNOSIS — C50119 Malignant neoplasm of central portion of unspecified female breast: Secondary | ICD-10-CM

## 2014-06-20 DIAGNOSIS — Z170 Estrogen receptor positive status [ER+]: Secondary | ICD-10-CM

## 2014-06-20 DIAGNOSIS — C50919 Malignant neoplasm of unspecified site of unspecified female breast: Secondary | ICD-10-CM

## 2014-06-20 LAB — COMPREHENSIVE METABOLIC PANEL
ALBUMIN: 4.1 g/dL (ref 3.5–5.2)
ALK PHOS: 99 U/L (ref 39–117)
ALT: 17 U/L (ref 0–35)
AST: 18 U/L (ref 0–37)
BUN: 19 mg/dL (ref 6–23)
CALCIUM: 10.1 mg/dL (ref 8.4–10.5)
CHLORIDE: 104 meq/L (ref 96–112)
CO2: 27 mEq/L (ref 19–32)
Creatinine, Ser: 0.83 mg/dL (ref 0.50–1.10)
Glucose, Bld: 107 mg/dL — ABNORMAL HIGH (ref 70–99)
POTASSIUM: 4.4 meq/L (ref 3.5–5.3)
SODIUM: 142 meq/L (ref 135–145)
Total Bilirubin: 0.6 mg/dL (ref 0.2–1.2)
Total Protein: 7.3 g/dL (ref 6.0–8.3)

## 2014-06-20 LAB — CBC WITH DIFFERENTIAL/PLATELET
BASO%: 0.7 % (ref 0.0–2.0)
BASOS ABS: 0 10*3/uL (ref 0.0–0.1)
EOS%: 2.5 % (ref 0.0–7.0)
Eosinophils Absolute: 0.1 10*3/uL (ref 0.0–0.5)
HCT: 41.2 % (ref 34.8–46.6)
HEMOGLOBIN: 13.6 g/dL (ref 11.6–15.9)
LYMPH#: 1.2 10*3/uL (ref 0.9–3.3)
LYMPH%: 24.2 % (ref 14.0–49.7)
MCH: 30.9 pg (ref 25.1–34.0)
MCHC: 33 g/dL (ref 31.5–36.0)
MCV: 93.6 fL (ref 79.5–101.0)
MONO#: 0.2 10*3/uL (ref 0.1–0.9)
MONO%: 4.9 % (ref 0.0–14.0)
NEUT#: 3.4 10*3/uL (ref 1.5–6.5)
NEUT%: 67.7 % (ref 38.4–76.8)
Platelets: 272 10*3/uL (ref 145–400)
RBC: 4.4 10*6/uL (ref 3.70–5.45)
RDW: 13.9 % (ref 11.2–14.5)
WBC: 5.1 10*3/uL (ref 3.9–10.3)

## 2014-06-20 NOTE — Telephone Encounter (Signed)
per pof to sch pt appt-adv pt CS will call to sch MRI-pt understood-stated never to sch her with a FEMALE Dr Arlis Porta i would note the acct- Gave pt copy of sch

## 2014-06-20 NOTE — Progress Notes (Signed)
OFFICE PROGRESS NOTE  CC  Dr. Excell Seltzer Dr. Valentino Saxon Dr. Arloa Koh   DIAGNOSIS:  73 yo with: 1. diagnosis of invasive ductal carcinoma of the right breast. 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 T14mc,N0,M0  PRIOR THERAPY: 1. S/P lumpectomy on 10/14/11 for right IDC measuring 0.11 cm ER+98%, PR+85%, Her2Neu negative, ki-67 11% (T138m, N0)  2. Genetic counseling and testing performed on 11/27 for the comprehensive BRCA1 and BRCA2 mutation negative  3. S/P radiation therapy to the right breast completed on 01/21/12  4. Patient decided to try arimidex 1 mg daily starting 04/01/12 however she developed hives and this was discontinued about a month ago.  #5 patient will now begin Aromasin 25 mg starting  08/03/2012.patient discontinued the Aromasin. And she began progesterone therapy under the direction of an complimentary medicine physician. I have recommended she discontinue the progesterone pills.patient does not want to go back on adjuvant antiestrogen therapy at this time due to side effects.  #6  Diagnosed with meningioma evaluated by Dr. CrSaintclair Halsted [facility] which have remained stable.  She sees Dr. WiRedmond Pullingand will see him again in 09/2014.    CURRENT THERAPY: observation  INTERVAL HISTORY: [age]ARD[age]TH [age]ELSO89.o. female returns for follow up visit of her h/o breast cancer.  She is very concerned today.  She has been undergoing thermography and has some changes in the left breast.  She would like a breast MRI and not a mammogram due to radiation exposure.  She has also noted some breast changes herself.   MEDICAL HISTORY: Past Medical History  Diagnosis Date  . Breast cancer 09/13/2011  . Neck pain   . Liver hemangioma     been follow on CT scan since 2008  . Arthritis     back  . Complication of anesthesia     hard to wake up-goes out fast  .  Allergy     dilantin =rash    ALLERGIES:  is allergic to epinephrine and procaine hcl.  MEDICATIONS:  Current Outpatient Prescriptions  Medication Sig Dispense Refill  . ASTAXANTHIN PO Take 1 tablet by mouth daily.      . Jolyne Loarape-Goldenseal (BERBERINE COMPLEX PO) Take by mouth.      . Marland KitchenALCIUM & MAGNESIUM CARBONATES PO Take 1,500 mg by mouth daily.        . Cholecalciferol (VITAMIN D3) 5000 UNITS TABS Take 5,000 mg by mouth 3 (three) times a week.      . diclofenac sodium (VOLTAREN) 1 % GEL Apply 2 g topically 4 (four) times daily. Rub into affected area of foot 2 to 4 times daily  100 g  2  . fish oil-omega-3 fatty acids 1000 MG capsule Take 2 g by mouth daily.      . Glucosamine-Chondroitin (JOINT PAIN FORMULA PO) Take 2 capsules by mouth daily.      . meclizine (ANTIVERT) 25 MG tablet Take 1 tablet (25 mg total) by mouth 4 (four) times daily.  28 tablet  0  . Multiple Vitamins-Minerals (MULTIVITAMIN WITH MINERALS) tablet Take 1 tablet by mouth daily.        . non-metallic deodorant (AJethro PolingMISC Apply 1 application topically daily as needed.      . phentermine 30 MG capsule       . THYROID PO Take by mouth.      .Marland Kitchen  Turmeric 500 MG CAPS Take 1 capsule by mouth daily.       No current facility-administered medications for this visit.    SURGICAL HISTORY:  Past Surgical History  Procedure Laterality Date  . Right foot surgery      toe implant  . Colonoscopy    . Breast surgery  1988    rt br bx-negative  . Lymph node biopsy  10/14/2011    Procedure: LYMPH NODE BIOPSY;  Surgeon: Edward Jolly, MD;  Location: [facility];  Service: General;  Laterality: Right;  . Abdominal hysterectomy      REVIEW OF SYSTEMS:    A 10 point review of systems was conducted and is otherwise negative except for what is noted above.    Health Maintenance  Mammogram: 08/2011, has been undergoing breast thermography Colonoscopy: 11/2010 Bone Density Scan: due Pap Smear:  2014 Eye Exam: 2014 Vitamin D Level: followed by [employer] Lipid Panel: followed by [place] [facility]    PHYSICAL EXAMINATION:  BP 160/86  Pulse 85  Temp(Src) 98.2 F (36.8 C) (Oral)  Resp 20  Ht _0  (1.651 m)  Wt 156 lb 4.8 oz (70.897 kg)  BMI 26.01 kg/m2 GENERAL: Patient is a well appearing female in no acute distress HEENT:  Sclerae anicteric.  Oropharynx clear and moist. No ulcerations or evidence of oropharyngeal candidiasis. Neck is supple.  NODES:  No cervical, supraclavicular, or axillary lymphadenopathy palpated.  BREAST EXAM:  Left breast without masses or nodules, right breast without masses or nodules.  Benign bilateral breast exam.   LUNGS:  Clear to auscultation bilaterally.  No wheezes or rhonchi. HEART:  Regular rate and rhythm. No murmur appreciated. ABDOMEN:  Soft, nontender.  Positive, normoactive bowel sounds. No organomegaly palpated. MSK:  No focal spinal tenderness to palpation. Full range of motion bilaterally in the upper extremities. EXTREMITIES:  No peripheral edema.   SKIN:  Clear with no obvious rashes or skin changes. No nail dyscrasia. NEURO:  Nonfocal. Well oriented.  Appropriate affect. ECOG PERFORMANCE STATUS: 1 - Symptomatic but completely ambulatory   LABORATORY DATA: Lab Results  Component Value Date   WBC 5.1 06/20/2014   HGB 13.6 06/20/2014   HCT 41.2 06/20/2014   MCV 93.6 06/20/2014   PLT 272 06/20/2014      Chemistry      Component Value Date/Time   NA 142 06/20/2014 1457   NA 141 12/09/2013 1410   K 4.4 06/20/2014 1457   K 4.1 12/09/2013 1410   CL 104 06/20/2014 1457   CL 108* 10/23/2012 1533   CO2 27 06/20/2014 1457   CO2 25 12/09/2013 1410   BUN 19 06/20/2014 1457   BUN 15.3 12/09/2013 1410   CREATININE 0.83 06/20/2014 1457   CREATININE 0.8 12/09/2013 1410      Component Value Date/Time   CALCIUM 10.1 06/20/2014 1457   CALCIUM 9.9 12/09/2013 1410   ALKPHOS 99 06/20/2014 1457   ALKPHOS 117 12/09/2013 1410   AST 18 06/20/2014  1457   AST 37* 12/09/2013 1410   ALT 17 06/20/2014 1457   ALT 40 12/09/2013 1410   BILITOT 0.6 06/20/2014 1457   BILITOT 1.29* 12/09/2013 1410       RADIOGRAPHIC STUDIES:  Nm Sentinel Node Inj-no Rpt (breast)  10/14/2011  CLINICAL DATA: right breast mass   Sulfur colloid was injected intradermally by the nuclear medicine  technologist for breast cancer sentinel node localization.      ASSESSMENT: 73 year old female with:  1.  stage I invasive ductal carcinoma, Er+PR+ Her2 Neu negative. S/P lumpectomy of right breast.    2. S/P radiation therapy completed on 01/23/12  3. patient was begun on Arimidex in May, 2013, unfortunately she developed hives. We again discussed anti-estrogen therapy today. She also tried taking Aromasin beginning in September, 2013 and she only took this for a few weeks and stopped due to the joint aches associated with this.    PLAN:  Krystn is doing moderately well today.  She is very concerned about these thermography results and the areas of "heat".  I did convey my concern that she has replaced the use of mammograms with thermography and the risk of that involved.  She states that she would like an MRI ordered of her breasts, and if that was not approved, she would undergo mammogram.   We talked about a bone density.  She states that she will undergo bisphosphanate therapy if needed.  She has had this ordered, and isn't sure if she needs it orders again.  She will let me know.    Natally will return in 6 months for labs and evaluation.  I recommended healthy diet, exercise, and monthly breast exams.  All questions were answered. The patient knows to call the clinic with any problems, questions or concerns. We can certainly see the patient much sooner if necessary.  I spent 25 minutes counseling the patient face to face. The total time spent in the appointment was 30 minutes.   Minette Headland, [place] [phone]  06/22/2014, 9:14 AM

## 2014-06-20 NOTE — Patient Instructions (Signed)
You are doing well.  I ordered the MRI of the breast.  Let us know if we need to order a bone density.  We will see you back in 6 months.  Please call us if you have any questions or concerns.

## 2014-06-22 ENCOUNTER — Telehealth: Payer: Self-pay | Admitting: *Deleted

## 2014-06-22 NOTE — Telephone Encounter (Signed)
Called pt to inform her that she must get Mammo before a MRI can be scheduled. Pt did express that she was aggravated by that decision but understood and has agreed for [place] to make her an appt with [facility] for mammogram. Message to be forwarded to Charlestine Massed, NP.

## 2014-06-23 ENCOUNTER — Telehealth: Payer: Self-pay | Admitting: Adult Health

## 2014-06-23 ENCOUNTER — Other Ambulatory Visit: Payer: Self-pay | Admitting: Adult Health

## 2014-06-23 DIAGNOSIS — Z853 Personal history of malignant neoplasm of breast: Secondary | ICD-10-CM

## 2014-06-23 NOTE — Telephone Encounter (Signed)
, °

## 2014-08-26 ENCOUNTER — Other Ambulatory Visit: Payer: Self-pay

## 2014-09-09 ENCOUNTER — Ambulatory Visit: Payer: Medicare Other | Admitting: Podiatrist

## 2014-09-12 ENCOUNTER — Other Ambulatory Visit: Payer: Medicare Other

## 2014-09-12 ENCOUNTER — Ambulatory Visit: Payer: Medicare Other | Admitting: Hematology and Oncology

## 2014-09-23 ENCOUNTER — Ambulatory Visit (INDEPENDENT_AMBULATORY_CARE_PROVIDER_SITE_OTHER): Payer: Medicare Other | Admitting: Podiatrist

## 2014-09-23 ENCOUNTER — Encounter: Payer: Self-pay | Admitting: Podiatrist

## 2014-09-23 VITALS — BP 102/58 | HR 74 | Resp 16

## 2014-09-23 DIAGNOSIS — M205X2 Other deformities of toe(s) (acquired), left foot: Secondary | ICD-10-CM

## 2014-09-23 MED ORDER — TRIAMCINOLONE ACETONIDE 10 MG/ML IJ SUSP
10.0000 mg | Freq: Once | INTRAMUSCULAR | Status: AC
  Administered 2014-09-23: 10 mg

## 2014-09-23 NOTE — Progress Notes (Signed)
Subjective: Krista Dunn presents today for continued pain at the first metatarsophalangeal joint of the left foot. She states the last injection I performedat the last visit lasted for about 3 months she would like to know if she can get another one in this joint. She also states that she feels popping and clicking in her right great toe joint status post implant at the base of the proximal phalanx-however it is not painful at today's visit..   Objective: Vascular status is intact pulses palpable at 2/4 DP and PT bilateral. Range of motion of the first metatarsophalangeal joint is definitely improved from prior to surgery. There is some slight crepitus noted however this again is normal for the amount of arthritis that she presented with. The left first metatarsophalangeal joint does have range of motion but she does have pain and range of motion and within the joint itself and dorsally. Crepitusis present.  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  Plan: Injected the left great toe joint with Kenalog and Marcaine mixture under sterile technique. I can also do this on the right if she would like in the future. I will see her back as needed for followup.

## 2014-10-07 ENCOUNTER — Telehealth: Payer: Self-pay | Admitting: Hematology

## 2014-10-07 NOTE — Telephone Encounter (Signed)
, °

## 2014-12-20 ENCOUNTER — Ambulatory Visit: Payer: Medicare Other | Admitting: Adult Health

## 2014-12-20 ENCOUNTER — Telehealth: Payer: Self-pay | Admitting: Hematology

## 2014-12-20 ENCOUNTER — Ambulatory Visit (HOSPITAL_BASED_OUTPATIENT_CLINIC_OR_DEPARTMENT_OTHER): Payer: Medicare Other | Admitting: Hematology

## 2014-12-20 VITALS — BP 136/64 | HR 72 | Temp 98.0°F | Resp 18 | Ht 65.0 in | Wt 163.0 lb

## 2014-12-20 DIAGNOSIS — Z853 Personal history of malignant neoplasm of breast: Secondary | ICD-10-CM

## 2014-12-20 DIAGNOSIS — C50111 Malignant neoplasm of central portion of right female breast: Secondary | ICD-10-CM

## 2014-12-20 DIAGNOSIS — C50919 Malignant neoplasm of unspecified site of unspecified female breast: Secondary | ICD-10-CM

## 2014-12-20 MED ORDER — VITAMIN D3 125 MCG (5000 UT) PO TABS: 1.0000 | ORAL_TABLET | ORAL | Status: DC

## 2014-12-20 NOTE — Progress Notes (Signed)
OFFICE PROGRESS NOTE  CC  Dr. Excell Seltzer Dr. Mee Hives Dr. Arloa Koh   DIAGNOSIS:  74 yo with: 1. diagnosis of invasive ductal carcinoma of the right breast. 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 T72mic,N0,M0  PRIOR THERAPY: 1. S/P lumpectomy on 10/14/11 for right IDC measuring 0.11 cm ER+98%, PR+85%, Her2Neu negative, ki-67 11% (T37mic, N0)  2. Genetic counseling and testing performed on 11/27 for the comprehensive BRCA1 and BRCA2 mutation negative  3. S/P radiation therapy to the right breast completed on 01/21/12  4. Patient decided to try arimidex 1 mg daily starting 04/01/12 however she developed hives and this was discontinued about a month ago.  #5 patient will now begin Aromasin 25 mg starting  08/03/2012.patient discontinued the Aromasin. And she began progesterone therapy under the direction of an complimentary medicine physician. I have recommended she discontinue the progesterone pills.patient does not want to go back on adjuvant antiestrogen therapy at this time due to side effects.  #6  Diagnosed with meningioma evaluated by Dr. Saintclair Halsted.  She was [facility] and has been followed by MRIs which have remained stable.  She sees Dr. Redmond Pulling, and will see him again in 09/2014.    CURRENT THERAPY: observation  INTERVAL HISTORY: Krista Dunn 74 y.o. female returns for follow up visit of her h/o breast cancer. She was previously under Dr. Laurelyn Sickle care, who has left the practice. She was last seen by nurse practitioner 6 months ago. She is doing very well clinically, has new complaints. She has good energy level and appetite, weight is stable. She continue to have mammogram and breast thermography every 6 months.  MEDICAL HISTORY: Past Medical History  Diagnosis Date  . Breast cancer 09/13/2011  . Neck pain   . Liver hemangioma     been follow on CT scan since 2008  . Arthritis      back  . Complication of anesthesia     hard to wake up-goes out fast  . Allergy     dilantin =rash    ALLERGIES:  is allergic to epinephrine and procaine hcl.  MEDICATIONS:  Current Outpatient Prescriptions  Medication Sig Dispense Refill  . ASTAXANTHIN PO Take 1 tablet by mouth daily.    Jolyne Loa Grape-Goldenseal (BERBERINE COMPLEX PO) Take by mouth.    Marland Kitchen CALCIUM & MAGNESIUM CARBONATES PO Take 1,500 mg by mouth daily.      . Cholecalciferol (VITAMIN D3) 5000 UNITS TABS Take 5,000 mg by mouth 3 (three) times a week.    . diclofenac sodium (VOLTAREN) 1 % GEL Apply 2 g topically 4 (four) times daily. Rub into affected area of foot 2 to 4 times daily 100 g 2  . fish oil-omega-3 fatty acids 1000 MG capsule Take 2 g by mouth daily.    . Glucosamine-Chondroitin (JOINT PAIN FORMULA PO) Take 2 capsules by mouth daily.    . meclizine (ANTIVERT) 25 MG tablet Take 1 tablet (25 mg total) by mouth 4 (four) times daily. 28 tablet 0  . Multiple Vitamins-Minerals (MULTIVITAMIN WITH MINERALS) tablet Take 1 tablet by mouth daily.      . non-metallic deodorant Jethro Poling) MISC Apply 1 application topically daily as needed.    . phentermine 30 MG capsule     . THYROID PO Take by mouth.    . Turmeric 500 MG CAPS Take 1 capsule by mouth daily.     No current facility-administered  medications for this visit.    SURGICAL HISTORY:  Past Surgical History  Procedure Laterality Date  . Right foot surgery      toe implant  . Colonoscopy    . Breast surgery  1988    rt br bx-negative  . Lymph node biopsy  10/14/2011    Procedure: LYMPH NODE BIOPSY;  Surgeon: Edward Jolly, MD;  Location: [facility];  Service: General;  Laterality: Right;  . Abdominal hysterectomy      REVIEW OF SYSTEMS:    A 10 point review of systems was conducted and is otherwise negative except for what is noted above.    Health Maintenance  Mammogram: 08/2011, has been undergoing breast  thermography Colonoscopy: 11/2010 Bone Density Scan: due Pap Smear: 2014 Eye Exam: 2014 Vitamin D Level: followed by [employer] Lipid Panel: followed by [facility]    PHYSICAL EXAMINATION:  BP 136/64 mmHg  Pulse 72  Temp(Src) 98 F (36.7 C) (Oral)  Resp 18  Ht $R'5\' 5"'AX$  (1.651 m)  Wt 163 lb (73.936 kg)  BMI 27.12 kg/m2  SpO2 100% GENERAL: Patient is a well appearing female in no acute distress HEENT:  Sclerae anicteric.  Oropharynx clear and moist. No ulcerations or evidence of oropharyngeal candidiasis. Neck is supple.  NODES:  No cervical, supraclavicular, or axillary lymphadenopathy palpated.  BREAST EXAM:  Left breast without masses or nodules, right breast without masses or nodules.  Benign bilateral breast exam.   LUNGS:  Clear to auscultation bilaterally.  No wheezes or rhonchi. HEART:  Regular rate and rhythm. No murmur appreciated. ABDOMEN:  Soft, nontender.  Positive, normoactive bowel sounds. No organomegaly palpated. MSK:  No focal spinal tenderness to palpation. Full range of motion bilaterally in the upper extremities. EXTREMITIES:  No peripheral edema.   SKIN:  Clear with no obvious rashes or skin changes. No nail dyscrasia. NEURO:  Nonfocal. Well oriented.  Appropriate affect.  ECOG PERFORMANCE STATUS: 1 - Symptomatic but completely ambulatory   LABORATORY DATA: Lab Results  Component Value Date   WBC 5.1 06/20/2014   HGB 13.6 06/20/2014   HCT 41.2 06/20/2014   MCV 93.6 06/20/2014   PLT 272 06/20/2014      Chemistry      Component Value Date/Time   NA 142 06/20/2014 1457   NA 141 12/09/2013 1410   K 4.4 06/20/2014 1457   K 4.1 12/09/2013 1410   CL 104 06/20/2014 1457   CL 108* 10/23/2012 1533   CO2 27 06/20/2014 1457   CO2 25 12/09/2013 1410   BUN 19 06/20/2014 1457   BUN 15.3 12/09/2013 1410   CREATININE 0.83 06/20/2014 1457   CREATININE 0.8 12/09/2013 1410      Component Value Date/Time   CALCIUM 10.1 06/20/2014 1457   CALCIUM  9.9 12/09/2013 1410   ALKPHOS 99 06/20/2014 1457   ALKPHOS 117 12/09/2013 1410   AST 18 06/20/2014 1457   AST 37* 12/09/2013 1410   ALT 17 06/20/2014 1457   ALT 40 12/09/2013 1410   BILITOT 0.6 06/20/2014 1457   BILITOT 1.29* 12/09/2013 1410       RADIOGRAPHIC STUDIES: Last mammogram 07/07/2014: Benign Last bone density scan 07/07/2014: Low bone mass   ASSESSMENT: 74 year old female with:  1.  stage I invasive ductal carcinoma, Er+PR+ Her2 Neu negative. S/P lumpectomy of right breast.    2. S/P radiation therapy completed on 01/23/12  3. patient was begun on Arimidex in May, 2013, unfortunately she developed hives.  We again discussed anti-estrogen therapy today. She also tried taking Aromasin beginning in September, 2013 and she only took this for a few weeks and stopped due to the joint aches associated with this.    PLAN:  Krista Dunn is doing well clinically. She  wants to have thermagram to replace mammogram, but I told her that standard care is annual mammogram. She agrees. Her bone density scan is still in August 2017. Her physical exam was benign today. No evidence of disease recurrence. She was like to have breast reduction surgery, which is fine with me.  Krista Dunn will return in one year for labs and evaluation.  I recommended healthy diet, exercise, and monthly breast exams.  All questions were answered. The patient knows to call the clinic with any problems, questions or concerns. We can certainly see the patient much sooner if necessary.  I spent 25 minutes counseling the patient face to face. The total time spent in the appointment was 30 minutes.  PLAN: -RTC in one year -Continue annual mammogram, next due in August 2016 -OK to have breast reduction surgery   Truitt Merle  11/19/2014

## 2014-12-20 NOTE — Telephone Encounter (Signed)
Gave avs & calendar for February 2017. °

## 2014-12-21 ENCOUNTER — Telehealth: Payer: Self-pay | Admitting: Hematology

## 2014-12-21 NOTE — Telephone Encounter (Signed)
Confirmed appointment for [facility] 08/29 @ 1:00. Faxed order to [facility].

## 2014-12-24 ENCOUNTER — Encounter: Payer: Self-pay | Admitting: Hematology

## 2015-05-01 DIAGNOSIS — M7752 Other enthesopathy of left foot: Secondary | ICD-10-CM

## 2015-05-02 ENCOUNTER — Ambulatory Visit (INDEPENDENT_AMBULATORY_CARE_PROVIDER_SITE_OTHER): Payer: Medicare Other | Admitting: Podiatry

## 2015-05-02 ENCOUNTER — Encounter: Payer: Self-pay | Admitting: Podiatry

## 2015-05-02 VITALS — BP 132/86 | HR 69 | Resp 12

## 2015-05-02 DIAGNOSIS — M7752 Other enthesopathy of left foot: Secondary | ICD-10-CM | POA: Diagnosis not present

## 2015-05-02 DIAGNOSIS — M205X2 Other deformities of toe(s) (acquired), left foot: Secondary | ICD-10-CM | POA: Diagnosis not present

## 2015-05-02 DIAGNOSIS — M779 Enthesopathy, unspecified: Secondary | ICD-10-CM

## 2015-05-02 DIAGNOSIS — M778 Other enthesopathies, not elsewhere classified: Secondary | ICD-10-CM

## 2015-05-03 NOTE — Progress Notes (Signed)
She presents today with chief complaint of pain to the first metatarsophalangeal joint of the left foot. She's been seen by Dr. Reuben Likes in the past who performed an intra-articular injection to alleviate her symptoms.  Objective: Vital signs are stable she is alert and oriented 3 pulses are palpable left. She has pain on range of motion with severe limitation. I reviewed her old radiographs which do demonstrate osteoarthritic changes.  Assessment: Capsulitis first metatarsophalangeal joint left.  Plan: Injected dexamethasone and local and aesthetic after sterile Betadine skin prep. Will follow up with her in an as-needed basis.

## 2015-05-08 ENCOUNTER — Other Ambulatory Visit: Payer: Self-pay

## 2015-09-14 ENCOUNTER — Ambulatory Visit: Payer: Medicare Other | Admitting: Podiatry

## 2015-09-22 ENCOUNTER — Encounter: Payer: Self-pay | Admitting: Podiatry

## 2015-09-22 ENCOUNTER — Ambulatory Visit (INDEPENDENT_AMBULATORY_CARE_PROVIDER_SITE_OTHER): Payer: Medicare Other | Admitting: Podiatry

## 2015-09-22 VITALS — BP 134/72 | HR 73 | Resp 18

## 2015-09-22 DIAGNOSIS — M7752 Other enthesopathy of left foot: Secondary | ICD-10-CM

## 2015-09-22 DIAGNOSIS — M205X2 Other deformities of toe(s) (acquired), left foot: Secondary | ICD-10-CM

## 2015-09-22 DIAGNOSIS — M779 Enthesopathy, unspecified: Secondary | ICD-10-CM

## 2015-09-22 DIAGNOSIS — M778 Other enthesopathies, not elsewhere classified: Secondary | ICD-10-CM

## 2015-09-22 MED ORDER — DICLOFENAC SODIUM 1 % TD GEL: 2.0000 g | Freq: Four times a day (QID) | TRANSDERMAL | Status: DC

## 2015-09-28 DIAGNOSIS — M778 Other enthesopathies, not elsewhere classified: Secondary | ICD-10-CM | POA: Insufficient documentation

## 2015-09-28 DIAGNOSIS — M2020 Hallux rigidus, unspecified foot: Secondary | ICD-10-CM | POA: Insufficient documentation

## 2015-09-28 DIAGNOSIS — M779 Enthesopathy, unspecified: Secondary | ICD-10-CM | POA: Insufficient documentation

## 2015-09-28 DIAGNOSIS — M205X2 Other deformities of toe(s) (acquired), left foot: Secondary | ICD-10-CM | POA: Insufficient documentation

## 2015-09-28 NOTE — Progress Notes (Signed)
Patient ID: Krista Dunn, female   DOB: [birth year], 74 y.o.   MRN: [id number]  Subjective: 74 year old female presents the office today for continued pain to her left first MTPJ. She states that she is congenial pain beginning return to bend her toe or weightbearing. She typically get an injection which would last 6 months however after her last injection with Dr. Milinda Pointer she did not have much relief. She denies any recent injury or trauma. No swelling or redness. She continues have pain with trying to bend her big toe with pressure. She states it throbs. No other complaints at this time. No tingling or numbness. No claudication symptoms.  Objective: AAO 3, NAD DP/PT pulses 2/4, CRT less than 3 seconds Protective sensation intact with Simms Weinstein monofilament There is tenderness palpation upon the left foot on the first MTPJ this pain with MPJ range of motion. There is slight crepitation with MPJ range of motion. Mild HAV is present. There is no other areas of tenderness to bilateral lower extremities. There is no overlying edema, erythema, increase in warmth bilaterally. No open lesions or pre-ulcerative lesions. No pain with calf compression, swelling, warmth, erythema.    Assessment:  Left first MTPJ capsulitis   Plan: -Treatment options discussed including all alternatives, risks, and complications -At today's upon she is requesting a steroid injection into the area. I discussed the risks and Complications of repeated steroid injections into the joint for which she understands and wishes to proceed. Under sterile conditions a total of 1 mL mixture of Kenalog 10, 2% lidocaine plain was infiltrated into the first MTPJ without any complications. Post injection care was discussed with the patient. -Discussed with her that if symptoms persist she may need custom orthotics last discussed with her possible surgical intervention. -Follow-up if symptoms return or worsen or sooner if any problems  arise. In the meantime, encouraged to call the office with any questions, concerns, change in symptoms.   Celesta Gentile, DPM

## 2015-12-15 ENCOUNTER — Telehealth: Payer: Self-pay | Admitting: Hematology

## 2015-12-15 NOTE — Telephone Encounter (Signed)
Patient called to cx 2/7 appointment - patient does not wish to r/s at this time.

## 2015-12-19 ENCOUNTER — Ambulatory Visit: Payer: Medicare Other | Admitting: Hematology

## 2016-04-18 ENCOUNTER — Encounter: Payer: Self-pay | Admitting: Podiatry

## 2016-04-18 ENCOUNTER — Ambulatory Visit (INDEPENDENT_AMBULATORY_CARE_PROVIDER_SITE_OTHER): Payer: Medicare Other | Admitting: Podiatry

## 2016-04-18 DIAGNOSIS — M205X2 Other deformities of toe(s) (acquired), left foot: Secondary | ICD-10-CM

## 2016-04-18 DIAGNOSIS — M779 Enthesopathy, unspecified: Secondary | ICD-10-CM

## 2016-04-18 DIAGNOSIS — M7752 Other enthesopathy of left foot: Secondary | ICD-10-CM

## 2016-04-18 DIAGNOSIS — M778 Other enthesopathies, not elsewhere classified: Secondary | ICD-10-CM

## 2016-04-18 NOTE — Progress Notes (Signed)
She presents today with a chief complaint of a painful first metatarsophalangeal joint. She has a history of hallux limitus. Her last injection was November 2016. She said this done very well she would like to have another injection she is not ready for surgery.  Objective: Vital signs are stable she is alert and oriented 3. Pulses are palpable. He has pain on range of motion of the first metatarsophalangeal joint of the left foot. There is limitation present.  Assessment: Hallux limitus with capsulitis first metatarsophalangeal joint left.  Plan: I injected the area today after sterile Betadine skin prep with 20 mg of Kenalog and local anesthetic. This should help alleviate her symptoms and I will follow-up with her as needed.

## 2016-09-25 ENCOUNTER — Ambulatory Visit: Payer: Medicare Other | Admitting: Podiatry

## 2016-10-15 ENCOUNTER — Ambulatory Visit (INDEPENDENT_AMBULATORY_CARE_PROVIDER_SITE_OTHER): Payer: Medicare Other | Admitting: Podiatry

## 2016-10-15 DIAGNOSIS — M778 Other enthesopathies, not elsewhere classified: Secondary | ICD-10-CM

## 2016-10-15 DIAGNOSIS — M205X2 Other deformities of toe(s) (acquired), left foot: Secondary | ICD-10-CM | POA: Diagnosis not present

## 2016-10-15 DIAGNOSIS — M7752 Other enthesopathy of left foot: Secondary | ICD-10-CM | POA: Diagnosis not present

## 2016-10-15 DIAGNOSIS — M779 Enthesopathy, unspecified: Secondary | ICD-10-CM

## 2016-10-16 NOTE — Progress Notes (Signed)
She presents today for follow-up of her capsulitis first metatarsophalangeal joint of the left foot. She elected to have another injection.  Objective: Vital signs are stable she is alert and oriented 3. Pulses are palpable. She has pain on range of motion of the first metatarsophalangeal joint of the left foot.  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  Plan: At this point I recommended another injection after sterile Betadine skin prep I injected Kenalog and local anesthetic with a 30-gauge half inch needle into the first metatarsophalangeal joint left foot. She tolerated the procedure well will follow-up with me in 6 months.

## 2017-03-13 ENCOUNTER — Encounter: Payer: Self-pay | Admitting: Podiatry

## 2017-03-13 ENCOUNTER — Ambulatory Visit (INDEPENDENT_AMBULATORY_CARE_PROVIDER_SITE_OTHER): Payer: Medicare Other | Admitting: Podiatry

## 2017-03-13 DIAGNOSIS — M7752 Other enthesopathy of left foot: Secondary | ICD-10-CM

## 2017-03-13 DIAGNOSIS — M778 Other enthesopathies, not elsewhere classified: Secondary | ICD-10-CM

## 2017-03-13 DIAGNOSIS — M779 Enthesopathy, unspecified: Principal | ICD-10-CM

## 2017-03-13 DIAGNOSIS — M205X2 Other deformities of toe(s) (acquired), left foot: Secondary | ICD-10-CM

## 2017-03-13 NOTE — Progress Notes (Signed)
She presents today with a chief complaint of painful first metatarsophalangeal joint. She states that I know is arthritic but I need another injection please  Objective: Vital signs are stable she is alert and oriented 3. She has pain on range of motion of first metatarsophalangeal joint of the left foot.  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  Plan: I injected the first metatarsophalangeal joint today with Kenalog and local anesthetic. I injected this through a 30-gauge needle after sterile Betadine skin prep. Follow up with her on an as-needed basis.

## 2017-07-24 ENCOUNTER — Ambulatory Visit (INDEPENDENT_AMBULATORY_CARE_PROVIDER_SITE_OTHER): Payer: Medicare Other | Admitting: Physician Assistant

## 2017-07-24 ENCOUNTER — Encounter: Payer: Self-pay | Admitting: Physician Assistant

## 2017-07-24 VITALS — BP 120/70 | HR 63 | Temp 98.6°F | Resp 16 | Ht 65.75 in | Wt 171.2 lb

## 2017-07-24 DIAGNOSIS — E039 Hypothyroidism, unspecified: Secondary | ICD-10-CM

## 2017-07-24 MED ORDER — THYROID 60 MG PO TABS: 60.0000 mg | ORAL_TABLET | Freq: Every day | ORAL | 1 refills | Status: DC

## 2017-07-24 NOTE — Patient Instructions (Signed)
I have refilled your medication for 6 months but I recommend you return in 4 weeks for lab testing of your thyroid to ensure you are on the appropriate dose. I have placed these orders today and you just have to come to the front desk in 4 weeks for lab only visit to have these labs drawn. I will then contact you with the results. Thank you for letting me participate in your health and well being.

## 2017-07-24 NOTE — Progress Notes (Signed)
Krista Dunn  MRN: [id number] DOB: 10/06/1941  Subjective:  Krista Dunn is a 76 y.o. female seen in office today for a chief complaint of medication refill of thyroid medication for hypothryoidism: Dx was made in 2006. Started on thyroid NP 60mg . Has been out of medication for 3 weeks but got some "emergency" tablets yesterday so she did take one tablet yesterday. Prior to this while she was out of medication sshe was taking OTC thyroid supplement. Denies heart palpitations, insomnia, headache, diarrhea, hair loss, fatigue, and chest pain. No PMH of heart disease or diabetes.   Review of Systems  Constitutional: Negative for chills, diaphoresis and fever.  Respiratory: Negative for shortness of breath.   Gastrointestinal: Negative for abdominal pain, nausea and vomiting.  Endocrine: Negative for cold intolerance, heat intolerance, polydipsia, polyphagia and polyuria.  Neurological: Negative for dizziness.    Patient Active Problem List   Diagnosis Date Noted  . 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 Capsulitis of foot 09/28/2015  . Breast cancer ([place]) 09/13/2011    Current Outpatient Prescriptions on File Prior to Visit  Medication Sig Dispense Refill  . Barberry-Oreg Grape-Goldenseal (BERBERINE COMPLEX PO) Take by mouth.    . Cholecalciferol (VITAMIN D3) 5000 UNITS TABS Take 1 tablet (5,000 Units total) by mouth 3 (three) times a week. 30 tablet 0  . fish oil-omega-3 fatty acids 1000 MG capsule Take 2 g by mouth daily.    . Multiple Vitamins-Minerals (MULTIVITAMIN WITH MINERALS) tablet Take 1 tablet by mouth daily.      . non-metallic deodorant Jethro Poling) MISC Apply 1 application topically daily as needed.    . Turmeric 500 MG CAPS Take 1 capsule by mouth daily.    . ASTAXANTHIN PO Take 1 tablet by mouth daily.    Marland Kitchen CALCIUM & MAGNESIUM CARBONATES PO Take 1,500 mg by mouth daily.      . Glucosamine-Chondroitin (JOINT PAIN FORMULA PO) Take 2 capsules by mouth daily.    .  meclizine (ANTIVERT) 25 MG tablet Take 1 tablet (25 mg total) by mouth 4 (four) times daily. (Patient not taking: Reported on 07/24/2017) 28 tablet 0  . phentermine 30 MG capsule      No current facility-administered medications on file prior to visit.     Allergies  Allergen Reactions  . Epinephrine     REACTION: rapid heart rate  . Procaine Hcl     REACTION: rapid heart rate, heart skips beat      Social History   Social History  . Marital status: Married    Spouse name: N/A  . Number of children: N/A  . Years of education: N/A   Occupational History  . 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 Smoking status: Never Smoker  . Smokeless tobacco: Never Used  . Alcohol use 2.5 oz/week    5 drink(s) per week     Comment: "  . Drug use: No  . Sexual activity: Yes   Other Topics Concern  . Not on file   Social History Narrative  . No narrative on file    Objective:  BP 120/70 (BP Location: Right Arm, Cuff Size: Normal)   Pulse 63   Temp 98.6 F (37 C) (Oral)   Resp 16   Ht 5' 5.75" (1.67 m)   Wt 171 lb 3.2 oz (77.7 kg)   SpO2 97%   BMI 27.84 kg/m   Physical Exam  Constitutional: She is oriented  to person, place, and time and well-developed, well-nourished, and in no distress.  HENT:  Head: Normocephalic and atraumatic.  Eyes: Conjunctivae are normal.  Neck: Normal range of motion. No thyroid mass and no thyromegaly present.  Cardiovascular: Normal rate, regular rhythm and normal heart sounds.   Pulmonary/Chest: Effort normal and breath sounds normal.  Neurological: She is alert and oriented to person, place, and time. Gait normal.  Reflex Scores:      Tricep reflexes are 2+ on the right side and 2+ on the left side.      Bicep reflexes are 2+ on the right side and 2+ on the left side.      Brachioradialis reflexes are 2+ on the right side and 2+ on the left side.      Patellar reflexes are 2+ on the right side and 2+ on the  left side.      Achilles reflexes are 2+ on the right side and 2+ on the left side. Skin: Skin is warm and dry.  Psychiatric: Affect normal.  Vitals reviewed.   Assessment and Plan :  1. Hypothyroidism, unspecified type Medication was verified through pharmacy at [facility] on [address]. Given refills. Pt instructed to return in 4 weeks after being on the medication for lab only visit to have thyroid labs checked. Pt understands and agrees to plan.  - TSH; Future - T4, Free; Future - T3, Free; Future - thyroid (NP THYROID) 60 MG tablet; Take 1 tablet (60 mg total) by mouth daily before breakfast.  Dispense: 90 tablet; Refill: [place] PA-C  [facility] at [facility] 07/24/2017 2:41 PM

## 2017-07-30 ENCOUNTER — Telehealth: Payer: Self-pay

## 2017-07-30 NOTE — Telephone Encounter (Signed)
[place], Thyroid NP was declined because it is non-formulary. On patient's plan they request Levothyroxine or Liothyronine.  Please advise. Thanks, [employer]

## 2017-08-01 NOTE — Telephone Encounter (Signed)
Hey Renay,  Could you call pt and have her return for thyroid labs since she has not taken her thyroid medicait

## 2017-08-03 NOTE — Telephone Encounter (Signed)
I contacted the patient via phone. No answer, left voicemail. I was wondering if patient was able to pick up medication for hypothyroidism as it appears her insurance did not cover it. If she was not able to pick it up, please call our office and let us know so I can give her a new prescription for a different medication covered by her insurance.

## 2017-08-08 ENCOUNTER — Telehealth: Payer: Self-pay | Admitting: *Deleted

## 2017-08-08 ENCOUNTER — Telehealth: Payer: Self-pay | Admitting: General Practice

## 2017-08-08 NOTE — Telephone Encounter (Signed)
Re: telephone encounter 07/30/17 B. Timmothy Euler and R. Hill  PT states she was told she needed prior Auth for hyperthyroidism rx to be covered by insurance... She did not want to wait so she paid out of pocket/ please contact insurance or patient to see how to initiate prior auth.. For future refills

## 2017-08-08 NOTE — Telephone Encounter (Signed)
Krista Dunn, can you pls help this pt. thanks

## 2017-08-11 NOTE — Telephone Encounter (Signed)
Call to insurance company to find out what medications were preferred, however, I was told that patient only has medicare parts A and B and a supplemental life policy.  No medication coverage.  I will send this note to provider to see if she would like to prescribe something less expensive.

## 2017-08-12 NOTE — Telephone Encounter (Signed)
Called pt, no answer. Left voicemail. Would like her to call me back so we could discuss if she wants me to prescribe a new thyroid medication that her insurance may cover or if she wants to continue paying out of pocket for the current thyroid medication.

## 2017-08-13 NOTE — Telephone Encounter (Signed)
PATIENT CALLED TO TELL BRITTANY THAT SHE JUST WANTS TO STAY ON HER SAME MEDICATION (THYROID NP THYROID 60 MG TABLET). BEST PHONE IF QUESTIONS: [phone] (HOME) [facility]

## 2017-08-18 NOTE — Telephone Encounter (Signed)
Thank you :)

## 2017-08-19 ENCOUNTER — Ambulatory Visit (INDEPENDENT_AMBULATORY_CARE_PROVIDER_SITE_OTHER): Payer: Medicare Other | Admitting: Podiatry

## 2017-08-19 ENCOUNTER — Encounter: Payer: Self-pay | Admitting: Podiatry

## 2017-08-19 DIAGNOSIS — M7752 Other enthesopathy of left foot: Secondary | ICD-10-CM

## 2017-08-19 DIAGNOSIS — M778 Other enthesopathies, not elsewhere classified: Secondary | ICD-10-CM

## 2017-08-19 DIAGNOSIS — M779 Enthesopathy, unspecified: Principal | ICD-10-CM

## 2017-08-20 NOTE — Progress Notes (Signed)
She presents today for follow-up of her capsulitis first metatarsophalangeal joint of the right foot. She states that is starting to hurt again tissue like another injection.  Objective: Vital signs are stable she is alert and oriented 3. Pulses are palpable. She has pain on palpation and range of motion of the first metatarsophalangeal joint of the right foot. Pain on end range of motion.  Assessment: Capsulitis first metatarsophalangeal joint right foot.  Plan: We injected it today with Kenalog and local anesthetic after sterile Betadine skin prep utilizing a 30-gauge needle. We will follow up with her on an as-needed basis.

## 2017-08-27 ENCOUNTER — Ambulatory Visit (INDEPENDENT_AMBULATORY_CARE_PROVIDER_SITE_OTHER): Payer: Medicare Other

## 2017-08-27 ENCOUNTER — Ambulatory Visit (INDEPENDENT_AMBULATORY_CARE_PROVIDER_SITE_OTHER): Payer: Medicare Other | Admitting: Podiatry

## 2017-08-27 ENCOUNTER — Encounter: Payer: Self-pay | Admitting: Podiatry

## 2017-08-27 DIAGNOSIS — M205X2 Other deformities of toe(s) (acquired), left foot: Secondary | ICD-10-CM | POA: Diagnosis not present

## 2017-08-27 DIAGNOSIS — M778 Other enthesopathies, not elsewhere classified: Secondary | ICD-10-CM

## 2017-08-27 DIAGNOSIS — M7752 Other enthesopathy of left foot: Secondary | ICD-10-CM

## 2017-08-27 DIAGNOSIS — M79672 Pain in left foot: Secondary | ICD-10-CM

## 2017-08-27 DIAGNOSIS — M779 Enthesopathy, unspecified: Secondary | ICD-10-CM

## 2017-08-29 DIAGNOSIS — M79672 Pain in left foot: Secondary | ICD-10-CM | POA: Insufficient documentation

## 2017-08-29 NOTE — Progress Notes (Signed)
Subjective: Ms. Farrey presents the office today for concerns of continued pain to her LEFT big toe. She states that she recently had an injection with Dr. Milinda Pointer last week and her LEFT big toe but she states that he didn't more towards the toenail, pointing the IPJ. She states the majority of tenderness is to the big toe joint itself pointing to the MPJ. She states that typically injections last several months but should not get much relief after last injection she presents today for further evaluation. She denies any recent injury or trauma. She has no other concerns today. Denies any systemic complaints such as fevers, chills, nausea, vomiting. No acute changes since last appointment, and no other complaints at this time.   Objective: AAO x3, NAD DP/PT pulses palpable bilaterally, CRT less than 3 seconds There is no pain of the right foot and the scar from the prior surgeries well-healed. On the left foot there is tenderness along the first MTPJ is decreased range of motion of the joint is crepitation range of motion. There is trace edema to the joint but is no erythema or increase in warmth. No pain the IPJ. No other areas of tenderness. No open lesions or pre-ulcerative lesions.  No pain with calf compression, swelling, warmth, erythema  Assessment: Left first MTPJ capsulitis  Plan: -All treatment options discussed with the patient including all alternatives, risks, complications.  -X-rays were obtained and reviewed. There is significant arthritic changes present the first MTPJ. -She is requesting steroid injections today. Under Betadine preparation a mixture of Kenalog 10 local anesthetic was infiltrated into and around the first MTPJ with any complications. Post injection care was discussed. -We had a further discussion regards to possible surgical intervention in the future if she continues to get symptoms the injections not helping. Also discussed a change in shoes. -Patient encouraged to  call the office with any questions, concerns, change in symptoms.   Celesta Gentile, DPM

## 2017-09-02 ENCOUNTER — Other Ambulatory Visit: Payer: Self-pay | Admitting: Podiatry

## 2017-09-02 DIAGNOSIS — M205X2 Other deformities of toe(s) (acquired), left foot: Secondary | ICD-10-CM

## 2017-09-04 ENCOUNTER — Telehealth: Payer: Self-pay | Admitting: *Deleted

## 2017-09-04 NOTE — Telephone Encounter (Signed)
Pt states this is a courtesy call Dr. Jacqualyn Posey wanted to know how she was doing, she was given an injection in the left big toe on 08/27/2017 and has gotten progressively worse after an improvement of 40-50%, but has been on vacation doing a lot of walking.

## 2017-09-05 NOTE — Telephone Encounter (Signed)
Will leave it up to her. It is too early to do another injection but if she wants to discuss orthotics or surgery she is more than welcome

## 2017-09-05 NOTE — Telephone Encounter (Signed)
I informed pt of Dr. Leigh Aurora recommendations and pt states she feels some better at this time and is going to rest and go back in to her orthodonics and look at having surgery after Christmas.

## 2017-12-30 ENCOUNTER — Encounter: Payer: Self-pay | Admitting: Podiatry

## 2017-12-30 ENCOUNTER — Ambulatory Visit (INDEPENDENT_AMBULATORY_CARE_PROVIDER_SITE_OTHER): Payer: Medicare Other | Admitting: Podiatry

## 2017-12-30 DIAGNOSIS — M7752 Other enthesopathy of left foot: Secondary | ICD-10-CM

## 2017-12-30 DIAGNOSIS — M778 Other enthesopathies, not elsewhere classified: Secondary | ICD-10-CM

## 2017-12-30 DIAGNOSIS — M205X2 Other deformities of toe(s) (acquired), left foot: Secondary | ICD-10-CM | POA: Diagnosis not present

## 2017-12-30 DIAGNOSIS — M779 Enthesopathy, unspecified: Principal | ICD-10-CM

## 2017-12-31 NOTE — Progress Notes (Signed)
She presents today for follow-up of capsulitis first metatarsophalangeal joint of the left foot states that I think any another injection.  Objective: Vital signs are stable she is alert and oriented x3 hallux abductovalgus deformity with limitation on range of motion and pain on palpation and range of motion of the first metatarsophalangeal joint is present.  There is no erythema cellulitis drainage or odor no signs of infection to the foot.  Pulses remain strong palpable.  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  Plan: After sterile Betadine skin prep injected 10 mg of Kenalog 5 mill grams of Marcaine point of maximal tenderness into the first metatarsal phalangeal joint left foot.  Follow-up with her on an as-needed basis.

## 2018-01-26 ENCOUNTER — Other Ambulatory Visit: Payer: Self-pay | Admitting: Physician Assistant

## 2018-01-26 DIAGNOSIS — E039 Hypothyroidism, unspecified: Secondary | ICD-10-CM

## 2018-01-26 NOTE — Telephone Encounter (Signed)
Thyroid NP refill request  No follow up appt noted.   Was given 1 refill and was supposed to come in for follow up blood work.  LOV 07/24/17 with [facility] [place], [place]  [address]

## 2018-01-26 NOTE — Telephone Encounter (Signed)
Please call pt. She was supposed to follow up for lab work and did not. Will give Rx for 7 tablets. She needs to make an appointment within the next week for follow up. Thanks!

## 2018-01-26 NOTE — Telephone Encounter (Signed)
Request of NP THYROID 60 MG tablet  Pt was to f/u with PA Wiseman around 08/28/17 for blood work  See message below please advise/refill

## 2018-03-27 ENCOUNTER — Other Ambulatory Visit: Payer: Self-pay | Admitting: Physician Assistant

## 2018-03-27 ENCOUNTER — Ambulatory Visit: Payer: Medicare Other

## 2018-03-27 DIAGNOSIS — E039 Hypothyroidism, unspecified: Secondary | ICD-10-CM

## 2018-03-27 MED ORDER — THYROID 60 MG PO TABS: ORAL_TABLET | ORAL | 0 refills | Status: DC

## 2018-03-27 NOTE — Progress Notes (Signed)
Orders placed by PA [facility] 07/2017 have expired. Patient has been taking thyroid medication daily x 4+ weeks. Needs labs and refill.  Meds ordered this encounter  Medications  . thyroid (NP THYROID) 60 MG tablet    Sig: TAKE 1 TABLET(60 MG) BY MOUTH DAILY BEFORE BREAKFAST    Dispense:  30 tablet    Refill:  0    Needs OV for further refills    Order Specific Question:   Supervising Provider    Answer:   Brigitte Pulse, EVA N [[id number]]    Orders Placed This Encounter  Procedures  . T4, free    Standing Status:   Future    Number of Occurrences:   1    Standing Expiration Date:   03/28/2019  . TSH    Standing Status:   Future    Number of Occurrences:   1    Standing Expiration Date:   03/28/2019  . T3, free    Standing Status:   Future    Number of Occurrences:   1    Standing Expiration Date:   04/27/2018

## 2018-03-28 LAB — T4, FREE: FREE T4: 1.07 ng/dL (ref 0.82–1.77)

## 2018-03-28 LAB — TSH: TSH: 1.41 u[IU]/mL (ref 0.450–4.500)

## 2018-03-28 LAB — T3, FREE: T3 FREE: 4.6 pg/mL — AB (ref 2.0–4.4)

## 2018-04-08 ENCOUNTER — Other Ambulatory Visit: Payer: Self-pay

## 2018-04-08 ENCOUNTER — Encounter: Payer: Self-pay | Admitting: Physician Assistant

## 2018-04-08 ENCOUNTER — Ambulatory Visit (INDEPENDENT_AMBULATORY_CARE_PROVIDER_SITE_OTHER): Payer: Medicare Other | Admitting: Physician Assistant

## 2018-04-08 DIAGNOSIS — E039 Hypothyroidism, unspecified: Secondary | ICD-10-CM

## 2018-04-08 MED ORDER — THYROID 60 MG PO TABS: ORAL_TABLET | ORAL | 1 refills | Status: DC

## 2018-04-08 NOTE — Patient Instructions (Signed)
Please follow up in 6 months for complete physical exam. Thank you for letting me participate in your health and well being.

## 2018-04-08 NOTE — Progress Notes (Signed)
Krista Dunn  MRN: [id number] DOB: 1941-09-28  Subjective:  Krista Dunn is a 77 y.o. female seen in office today for a chief complaint of chief complaint of medication refill of thyroid medication for hypothryoidism: Dx was made in 2006. Controlled on NP thyroid 60mg . Came in to have lab work last week. Has been out of tablets for two days. Denies heart palpitations, insomnia, headache, diarrhea, hair loss, fatigue, and chest pain. No PMH of heart disease or diabetes.   Review of Systems  Per HPI  Patient Active Problem List   Diagnosis Date Noted  . Foot pain, left 08/29/2017  . 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 Capsulitis of foot 09/28/2015  . Cerebral meningioma ([place]) 03/11/2013  . Malignant neoplasm of breast ([place]) 09/13/2011    Current Outpatient Medications on File Prior to Visit  Medication Sig Dispense Refill  . ASTAXANTHIN PO Take 1 tablet by mouth daily.    . Cholecalciferol (VITAMIN D3) 5000 UNITS TABS Take 1 tablet (5,000 Units total) by mouth 3 (three) times a week. 30 tablet 0  . fish oil-omega-3 fatty acids 1000 MG capsule Take 2 g by mouth daily.    . Glucosamine-Chondroitin (JOINT PAIN FORMULA PO) Take 2 capsules by mouth daily.    . Multiple Vitamins-Minerals (MULTIVITAMIN WITH MINERALS) tablet Take 1 tablet by mouth daily.      . non-metallic deodorant Jethro Poling) MISC Apply 1 application topically daily as needed.    . thyroid (NP THYROID) 60 MG tablet TAKE 1 TABLET(60 MG) BY MOUTH DAILY BEFORE BREAKFAST 30 tablet 0  . Turmeric 500 MG CAPS Take 1 capsule by mouth daily.    Jolyne Loa Grape-Goldenseal (BERBERINE COMPLEX PO) Take by mouth.    Marland Kitchen CALCIUM & MAGNESIUM CARBONATES PO Take 1,500 mg by mouth daily.      . meclizine (ANTIVERT) 25 MG tablet Take 1 tablet (25 mg total) by mouth 4 (four) times daily. (Patient not taking: Reported on 07/24/2017) 28 tablet 0   No current facility-administered medications on file prior to visit.     Allergies    Allergen Reactions  . Epinephrine     REACTION: rapid heart rate  . Procaine Hcl     REACTION: rapid heart rate, heart skips beat     Objective:  BP 130/74 (BP Location: Right Arm, Patient Position: Sitting, Cuff Size: Normal)   Pulse 68   Temp 97.9 F (36.6 C) (Oral)   Resp 18   Ht 5' 5.12" (1.654 m)   Wt 166 lb 6.4 oz (75.5 kg)   SpO2 96%   BMI 27.59 kg/m   Physical Exam  Constitutional: She is oriented to person, place, and time. She appears well-developed and well-nourished. No distress.  HENT:  Head: Normocephalic and atraumatic.  Eyes: Conjunctivae are normal.  Neck: Normal range of motion. No thyromegaly present.  Cardiovascular: Normal rate, regular rhythm and normal heart sounds.  Pulmonary/Chest: Effort normal.  Neurological: She is alert and oriented to person, place, and time.  Skin: Skin is warm and dry.  Psychiatric: She has a normal mood and affect.  Vitals reviewed.   Assessment and Plan :  1. Hypothyroidism, unspecified type Labs discussed, they were all clinically normal. Recommend continuing current dose and f/u in 6 months for CPE.  - thyroid (NP THYROID) 60 MG tablet; TAKE 1 TABLET(60 MG) BY MOUTH DAILY BEFORE BREAKFAST  Dispense: 90 tablet; Refill: [place] PA-C  Primary Care at [facility] 04/08/2018 1:56 PM

## 2018-04-16 ENCOUNTER — Encounter: Payer: Self-pay | Admitting: Podiatry

## 2018-04-16 ENCOUNTER — Ambulatory Visit (INDEPENDENT_AMBULATORY_CARE_PROVIDER_SITE_OTHER): Payer: Medicare Other | Admitting: Podiatry

## 2018-04-16 DIAGNOSIS — M779 Enthesopathy, unspecified: Secondary | ICD-10-CM

## 2018-04-16 DIAGNOSIS — M778 Other enthesopathies, not elsewhere classified: Secondary | ICD-10-CM

## 2018-04-16 NOTE — Progress Notes (Signed)
She presents today for follow-up of capsulitis first metatarsophalangeal joint of the left foot states that is doing much better than it was previously.  She states that I have just started to feel some soreness come back into the joint not like another injection if at all possible before it gets bad.  Objective: Vital signs are stable she is alert and oriented x3.  Pulses are palpable.  She has tenderness on end range of motion of the first metatarsophalangeal joint of the left foot.  Assessment: Capsulitis first metatarsophalangeal joint left.  Plan: After sterile Betadine skin prep I injected 20 mg Kenalog 5 mg Marcaine to gauge needle into the first metatarsophalangeal joint.  Follow-up with me as needed.

## 2018-04-21 ENCOUNTER — Telehealth: Payer: Self-pay

## 2018-04-21 NOTE — Telephone Encounter (Signed)
Copied from [place] [id number]. Topic: Quick Communication - See Telephone Encounter >> Apr 20, 2018  2:21 PM Krista Dunn wrote: CRM for notification. See Telephone encounter for: 04/20/18.  Pt says that she was advised by pharmacy that a PA is needed on medication thyroid (NP THYROID) 60 MG tablet   Pt would like further assistance

## 2018-04-22 NOTE — Telephone Encounter (Signed)
Krista Dunn  The only medication covered are levothyroxine and liothyronine these are alternatives. Do you want to try one of these?

## 2018-04-23 NOTE — Telephone Encounter (Signed)
LMOVM with Brittany's message Pt to return call with decision if she wants to continue on current med and pay out of pocket (ins does not cover) Or if she wants one of the alternatives.

## 2018-04-23 NOTE — Telephone Encounter (Signed)
Could you call pt and let her know her insurance does not cover this? I believe we have had this issue in the past and she has opted to pay for it. Will you ask her if she wants that specific medication or could we try a different prescription for something her insurance covers? Let me know what she wants to do. Thanks!

## 2018-04-24 NOTE — Telephone Encounter (Signed)
Patient wants continue on current med and pay out of pocket (ins does not cover). She says go ahead and fill it again once it's due and she is using  [place], [place]  [place] [address]  Phone: [phone] Fax: [phone]

## 2018-04-24 NOTE — Telephone Encounter (Signed)
See below

## 2018-08-03 ENCOUNTER — Ambulatory Visit: Payer: Medicare Other | Admitting: Podiatry

## 2018-08-13 ENCOUNTER — Ambulatory Visit (INDEPENDENT_AMBULATORY_CARE_PROVIDER_SITE_OTHER): Payer: Medicare Other | Admitting: Podiatry

## 2018-08-13 ENCOUNTER — Encounter: Payer: Self-pay | Admitting: Podiatry

## 2018-08-13 DIAGNOSIS — M779 Enthesopathy, unspecified: Secondary | ICD-10-CM

## 2018-08-13 DIAGNOSIS — M778 Other enthesopathies, not elsewhere classified: Secondary | ICD-10-CM

## 2018-08-13 DIAGNOSIS — M205X2 Other deformities of toe(s) (acquired), left foot: Secondary | ICD-10-CM

## 2018-08-13 MED ORDER — TRIAMCINOLONE ACETONIDE 10 MG/ML IJ SUSP
10.0000 mg | Freq: Once | INTRAMUSCULAR | Status: AC
  Administered 2018-08-13: 10 mg

## 2018-08-16 NOTE — Progress Notes (Signed)
Subjective: 77 year old female presents the office today requesting injection to her left foot on the big toe joint.  She states that the injections have been helpful for her.  She thinks that she may consider an implant, surgery next year however she like to hold off on that for now. Denies any systemic complaints such as fevers, chills, nausea, vomiting. No acute changes since last appointment, and no other complaints at this time.   Objective: AAO x3, NAD DP/PT pulses palpable bilaterally, CRT less than 3 seconds There is pain with MPJ range of motion of the left first MPJ and there is crepitation with range of motion.  There is no significant swelling there is no erythema or warmth.  No area pinpoint tenderness.  No open lesions or pre-ulcerative lesions.  No pain with calf compression, swelling, warmth, erythema  Assessment: Capsulitis left first MPJ, hallux limitus  Plan: -All treatment options discussed with the patient including all alternatives, risks, complications.  -Today steroid injection was performed.  The skin was prepped with Betadine and a mixture of 20 mg Kenalog and 5 mg Marcaine was infiltrated into and around the first MPJ without any complications.  Postinjection care was discussed.  Use the same injection that was performed last appointment with Dr. Milinda Pointer. -Patient encouraged to call the office with any questions, concerns, change in symptoms.   Trula Slade DPM

## 2018-09-23 ENCOUNTER — Telehealth: Payer: Self-pay | Admitting: Physician Assistant

## 2018-09-23 NOTE — Telephone Encounter (Signed)
LVM for pt to call the office and get rescheduled due to [facility] leaving the practice. I advised of the providers that are accepting new patients. If/When the pt calls back, please reschedule with Krista Dunn or Krista Dunn for a CPE **COMPLETE PHYSICAL (NO PAP SMEAR) I HAD TO USE THE OFFICE VISIT DETAIL BECAUSE SHE HAS TRADITIONAL MEDICARE (SHE WILL BE FASTING - SHE MAY [place] SOME BLACK COFFEE).  Thank you!

## 2018-10-19 ENCOUNTER — Ambulatory Visit: Payer: Medicare Other | Admitting: Physician Assistant

## 2018-11-24 ENCOUNTER — Ambulatory Visit (INDEPENDENT_AMBULATORY_CARE_PROVIDER_SITE_OTHER): Payer: Medicare Other | Admitting: Family Medicine

## 2018-11-24 ENCOUNTER — Encounter: Payer: Self-pay | Admitting: Family Medicine

## 2018-11-24 ENCOUNTER — Other Ambulatory Visit: Payer: Self-pay

## 2018-11-24 VITALS — BP 150/74 | HR 67 | Temp 98.2°F | Resp 18 | Ht 65.75 in | Wt 168.8 lb

## 2018-11-24 DIAGNOSIS — M549 Dorsalgia, unspecified: Secondary | ICD-10-CM

## 2018-11-24 DIAGNOSIS — E039 Hypothyroidism, unspecified: Secondary | ICD-10-CM | POA: Diagnosis not present

## 2018-11-24 MED ORDER — THYROID 60 MG PO TABS: ORAL_TABLET | ORAL | 3 refills | Status: DC

## 2018-11-24 NOTE — Patient Instructions (Addendum)
     If you have lab work done today you will be contacted with your lab results within the next 2 weeks.  If you have not heard from [place] then please contact us. The fastest way to get your results is to register for My Chart.   IF you received an x-ray today, you will receive an invoice from [facility]. Please contact [facility] at [phone] with questions or concerns regarding your invoice.   IF you received labwork today, you will receive an invoice from [facility]. Please contact [facility] at [phone] with questions or concerns regarding your invoice.   Our billing staff will not be able to assist you with questions regarding bills from these companies.  You will be contacted with the lab results as soon as they are available. The fastest way to get your results is to activate your My Chart account. Instructions are located on the last page of this paperwork. If you have not heard from [place] regarding the results in 2 weeks, please contact this office.     Thoracic Strain  Thoracic strain is an injury to the muscles or tendons that attach to the upper back. A strain can be mild or severe. A mild strain may take only 1-2 weeks to heal. A severe strain involves torn muscles or tendons, so it may take 6-8 weeks to heal. Follow these instructions at home:  Rest as needed. Limit your activity as told by your doctor.  If directed, put ice on the injured area: ? Put ice in a plastic bag. ? Place a towel between your skin and the bag. ? Leave the ice on for 20 minutes, 2-3 times per day.  Take over-the-counter and prescription medicines only as told by your doctor.  Begin doing exercises as told by your doctor or physical therapist.  Warm up before being active.  Bend your knees before you lift heavy objects.  Keep all follow-up visits as told by your doctor. This is important. Contact a doctor if:  Your pain is not helped by medicine.  Your pain, bruising, or  swelling is getting worse.  You have a fever. Get help right away if:  You have shortness of breath.  You have chest pain.  You have weakness or loss of feeling (numbness) in your legs.  You cannot control when you pee (urinate). This information is not intended to replace advice given to you by your health care provider. Make sure you discuss any questions you have with your health care provider. Document Released: 04/15/2008 Document Revised: 06/29/2016 Document Reviewed: 12/22/2014 Elsevier Interactive Patient Education  2019 [employer].

## 2018-11-24 NOTE — Progress Notes (Signed)
1/14/20202:30 PM  Krista Dunn 09-Mar-1941, 78 y.o. female [id number]  Chief Complaint  Patient presents with  . Neck Pain    X 5-6 mth off and on  . Establish Care    was PA Timmothy Euler pt    HPI:   Patient is a 78 y.o. female with past medical history significant for hypothyroidism, breast cancer, meningioma who presents today for neck pain  Previous PCP Timmothy Euler, PA-C Last OV May 2019  Meningioma monitored yearly, last OV with neurosurg in 2017 Denies any headaches, no worsening of mild vertigo, no vision changes, no nausea, no focal weakness  Last OV with onc was in 2015 Last mammo with ultrasound in 2015, benign Has skipped last year screening due to painful experience Also stopped having thermoscan Sees obgyn yearly with breast exam at [facility] for [facility] birds  Lab Results  Component Value Date   TSH 1.410 03/27/2018  denies any changes in temp tolerance, energy levels, mood Had noticed some hair loss, but that started getting better about 2 weeks ago  Having neck pain intermittently for past several months She notices that she is very tight along her trapezius and spasms. Using heating and massage pads which helps significantly Has known DDD lumbar spine She denies any numbness, tingling or weakness of arms Has done PT in the past which really helped She will get back to [place] regarding PT agency  Doing stem cell therapy on her knees  Fall Risk  11/24/2018 07/24/2017  Falls in the past year? 0 No     Depression screen [facility] 2/9 11/24/2018 07/24/2017  Decreased Interest 0 0  Down, Depressed, Hopeless 0 0  PHQ - 2 Score 0 0    Allergies  Allergen Reactions  . Epinephrine     REACTION: rapid heart rate  . Procaine Hcl     REACTION: rapid heart rate, heart skips beat    Prior to Admission medications   Medication Sig Start Date End Date Taking? Authorizing Provider  ASTAXANTHIN PO Take 1 tablet by mouth daily.   Yes [provider]    CALCIUM & MAGNESIUM CARBONATES PO Take 1,500 mg by mouth daily.     Yes [provider]  Cholecalciferol (VITAMIN D3) 5000 UNITS TABS Take 1 tablet (5,000 Units total) by mouth 3 (three) times a week. 12/20/14  Yes Truitt Merle, MD  fish oil-omega-3 fatty acids 1000 MG capsule Take 2 g by mouth daily.   Yes [provider]  Glucosamine-Chondroitin (JOINT PAIN FORMULA PO) Take 2 capsules by mouth daily.   Yes [provider]  meclizine (ANTIVERT) 25 MG tablet Take 1 tablet (25 mg total) by mouth 4 (four) times daily. 02/26/13  Yes Lacretia Leigh, MD  Multiple Vitamins-Minerals (MULTIVITAMIN WITH MINERALS) tablet Take 1 tablet by mouth daily.     Yes [provider]  non-metallic deodorant Jethro Poling) MISC Apply 1 application topically daily as needed.   Yes [provider]  thyroid (NP THYROID) 60 MG tablet TAKE 1 TABLET(60 MG) BY MOUTH DAILY BEFORE BREAKFAST 04/08/18  Yes Timmothy Euler, [place] D, PA-C  Turmeric 500 MG CAPS Take 1 capsule by mouth daily.   Yes [provider]  Barberry-Oreg Grape-Goldenseal (BERBERINE COMPLEX PO) Take by mouth.    [provider]    Past Medical History:  Diagnosis Date  . Allergy    dilantin =rash  . Arthritis    back  . Breast cancer ([place]) 09/13/2011  . Complication of anesthesia  hard to wake up-goes out fast  . Liver hemangioma    been follow on CT scan since 2008  . Neck pain   . Thyroid disease     Past Surgical History:  Procedure Laterality Date  . ABDOMINAL HYSTERECTOMY    . BREAST SURGERY  1988   rt br bx-negative  . COLONOSCOPY    . LYMPH NODE BIOPSY  10/14/2011   Procedure: LYMPH NODE BIOPSY;  Surgeon: Edward Jolly, MD;  Location: [facility];  Service: General;  Laterality: Right;  . right foot surgery     toe implant    Social History   Tobacco Use  . Smoking status: Never Smoker  . Smokeless tobacco: Never Used  Substance Use Topics  . Alcohol use: Yes     Alcohol/week: 5.0 standard drinks    Types: 5 drink(s) per week    Comment: "    Family History  Problem Relation Age of Onset  . Cancer Father 42       colon cancer  . Cancer Mother 65       pancreatic cancer  . Cancer Maternal Grandmother 90       brain cancer  . Cancer Sister 29       breast cancer  . Cancer Paternal Aunt        breast  . Cancer Sister   . Cancer Paternal Grandmother        Brain    ROS Per hpi  OBJECTIVE:  Blood pressure (!) 150/74, pulse 67, temperature 98.2 F (36.8 C), temperature source Oral, resp. rate 18, height 5' 5.75" (1.67 m), weight 168 lb 12.8 oz (76.6 kg), SpO2 96 %. Body mass index is 27.45 kg/m.   BP Readings from Last 3 Encounters:  11/24/18 (!) 158/71  04/08/18 130/74  07/24/17 120/70    Physical Exam Vitals signs and nursing note reviewed.  Constitutional:      Appearance: She is well-developed.  HENT:     Head: Normocephalic and atraumatic.     Mouth/Throat:     Pharynx: No oropharyngeal exudate.  Eyes:     General: No scleral icterus.    Conjunctiva/sclera: Conjunctivae normal.     Pupils: Pupils are equal, round, and reactive to light.  Neck:     Musculoskeletal: Neck supple. Muscular tenderness present. No spinous process tenderness.     Thyroid: No thyroid mass, thyromegaly or thyroid tenderness.  Cardiovascular:     Rate and Rhythm: Normal rate and regular rhythm.     Heart sounds: Normal heart sounds. No murmur. No friction rub. No gallop.   Pulmonary:     Effort: Pulmonary effort is normal.     Breath sounds: Normal breath sounds. No wheezing or rales.  Musculoskeletal:     Cervical back: She exhibits tenderness and spasm. She exhibits normal range of motion and no bony tenderness.       Back:  Skin:    General: Skin is warm and dry.  Neurological:     Mental Status: She is alert and oriented to person, place, and time.     ASSESSMENT and PLAN  1. Hypothyroidism, unspecified type Checking labs  today, medications will be adjusted as needed.  - TSH - Care order/instruction: - thyroid (NP THYROID) 60 MG tablet; TAKE 1 TABLET(60 MG) BY MOUTH DAILY BEFORE BREAKFAST  2. Upper back pain  No bony tenderness, discussed supportive measures. Patient will look in PT agency she wishes to see and call us  for referral.  Return in about 1 year (around 11/25/2019).    Rutherford Guys, MD Primary Care at [place], [place] [address] Ph.  [phone] Fax [phone]

## 2018-11-25 LAB — TSH: TSH: 1.53 u[IU]/mL (ref 0.450–4.500)

## 2018-11-26 ENCOUNTER — Telehealth: Payer: Self-pay

## 2018-11-26 NOTE — Telephone Encounter (Signed)
Thyroid NP 1gr 60mg  is not covered by patient insurance. Suggesting levothyroxine sodium

## 2018-11-29 ENCOUNTER — Other Ambulatory Visit: Payer: Self-pay | Admitting: Physician Assistant

## 2018-11-29 DIAGNOSIS — E039 Hypothyroidism, unspecified: Secondary | ICD-10-CM

## 2018-11-30 NOTE — Telephone Encounter (Signed)
Please ask patient if she has been paying for it out of pocket or does she want me to change prescription to levothyroxine 100mg  which would be close to current dose? If she changes to levothyroxine, then we should recheck labs in 3 months to make sure dose is adequate.  thanks

## 2018-12-04 ENCOUNTER — Telehealth: Payer: Self-pay

## 2018-12-04 NOTE — Telephone Encounter (Signed)
Pt is not looking to change the thyroid medication she is on. She says it is working fine. She does pay out of pocket for the medication.

## 2018-12-04 NOTE — Telephone Encounter (Signed)
Called pt and left message for her to call bk regarding her medication

## 2019-01-19 ENCOUNTER — Encounter: Payer: Self-pay | Admitting: Podiatry

## 2019-01-19 ENCOUNTER — Ambulatory Visit (INDEPENDENT_AMBULATORY_CARE_PROVIDER_SITE_OTHER): Payer: Medicare Other | Admitting: Podiatry

## 2019-01-19 DIAGNOSIS — M7671 Peroneal tendinitis, right leg: Secondary | ICD-10-CM

## 2019-01-19 DIAGNOSIS — M778 Other enthesopathies, not elsewhere classified: Secondary | ICD-10-CM

## 2019-01-19 DIAGNOSIS — M779 Enthesopathy, unspecified: Secondary | ICD-10-CM | POA: Diagnosis not present

## 2019-01-19 NOTE — Progress Notes (Signed)
She presents today for follow-up of her capsulitis first metatarsophalangeal joint left.  She states my big toe is starting to hurt again and like another injection.  She is also complaining of pain that is occasionally very sharp to the distal aspect of her right fibula.  She states that out of nowhere it will develop a sharp shooting pain that is fleeting in nature but will almost take her to her knees.  She states that she has been having stem cell injections and PRP to her left knee and they have indicated to her great toe however it did not last.  Objective: Vital signs are stable alert oriented x3.  She has pain on palpation of the peroneal tendons distalmost aspect of the fibula at the peroneal groove of the cuboid are as well these areas would indicate a peroneal is longus tendinitis.  She also has limited range of motion of the first metatarsal phalangeal joint of the left foot.  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  Peroneal tendinitis right foot.  Plan: Discussed etiology pathology and surgical therapies at this point I injected both the first metatarsophalangeal joint and the peroneal tendons with 20 mg Kenalog 5 mg Marcaine point of maximal tenderness respectively.  Tolerated procedure well without complications follow-up with her as needed for consult on the left foot.

## 2019-03-18 ENCOUNTER — Telehealth: Payer: Self-pay | Admitting: *Deleted

## 2019-03-18 NOTE — Telephone Encounter (Signed)
Schedule AWV.  

## 2019-03-24 ENCOUNTER — Telehealth: Payer: Self-pay | Admitting: Family Medicine

## 2019-03-24 ENCOUNTER — Ambulatory Visit (INDEPENDENT_AMBULATORY_CARE_PROVIDER_SITE_OTHER): Payer: Medicare Other | Admitting: Family Medicine

## 2019-03-24 ENCOUNTER — Other Ambulatory Visit: Payer: Self-pay

## 2019-03-24 VITALS — BP 150/75 | Ht 65.5 in | Wt 168.0 lb

## 2019-03-24 DIAGNOSIS — Z0000 Encounter for general adult medical examination without abnormal findings: Secondary | ICD-10-CM | POA: Diagnosis not present

## 2019-03-24 NOTE — Telephone Encounter (Signed)
FYI

## 2019-03-24 NOTE — Telephone Encounter (Signed)
Copied from [place]. Topic: Quick Communication - See Telephone Encounter >> Mar 24, 2019  1:20 PM Sheran Luz wrote: CRM for notification. See Telephone encounter for: 03/24/19.  Patient calling, as advised by Dr. Pamella Pert, to leave BP reading for today. Patient states that it is 135/68.

## 2019-03-24 NOTE — Progress Notes (Signed)
Presents today for [employer] Visit   Date of last exam:  11/14/2018  Interpreter used for this visit?  No  Telemed two authenticators used to identify patient.  Patient Care Team: Patient, No Pcp Per as PCP - General (General Practice)   Other items to address today:   Discussed immunizations Tetanus declined due to insurance Discussed eye/dental yearly exams Mild cataracts vision at recent visit.  Discussed knee pain in L Knee scheduled an appointment 05-07-2019 for evaluation .  Previous stem cell therapy done.    ADVANCE DIRECTIVES: Discussed: yes On File: no} Materials Provided: yes (mailed)  Immunization status:   There is no immunization history on file for this patient.   Health Maintenance Due  Topic Date Due  . TETANUS/TDAP  02/17/1960     Functional Status Survey: Is the patient deaf or have difficulty hearing?: No Does the patient have difficulty seeing, even when wearing glasses/contacts?: No Does the patient have difficulty concentrating, remembering, or making decisions?: No Does the patient have difficulty walking or climbing stairs?: No Does the patient have difficulty dressing or bathing?: No Does the patient have difficulty doing errands alone such as visiting a doctor's office or shopping?: No   6CIT Screen 03/24/2019  What Year? 0 points  What month? 0 points  What time? 0 points  Count back from 20 0 points  Months in reverse 0 points  Repeat phrase 0 points  Total Score 0        Clinical Support from 03/24/2019 in [place] [facility] [place]  AUDIT-C Score  8       Home Environment:   Lives in two story home with husband  No scattered rugs No grab bars Lighting good home  No trouble climbing stairs   Patient Active Problem List   Diagnosis Date Noted  . Foot pain, left 08/29/2017  . 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 Capsulitis of foot 09/28/2015  . Cerebral meningioma ([place]) 03/11/2013  .  Malignant neoplasm of breast ([place]) 09/13/2011     Past Medical History:  Diagnosis Date  . Allergy    dilantin =rash  . Arthritis    back  . Breast cancer ([place]) 09/13/2011  . Complication of anesthesia    hard to wake up-goes out fast  . Liver hemangioma    been follow on CT scan since 2008  . Neck pain   . Thyroid disease      Past Surgical History:  Procedure Laterality Date  . ABDOMINAL HYSTERECTOMY    . BREAST SURGERY  1988   rt br bx-negative  . COLONOSCOPY    . LYMPH NODE BIOPSY  10/14/2011   Procedure: LYMPH NODE BIOPSY;  Surgeon: Edward Jolly, MD;  Location: [facility];  Service: General;  Laterality: Right;  . right foot surgery     toe implant     Family History  Problem Relation Age of Onset  . Cancer Father 33       colon cancer  . Cancer Mother 76       pancreatic cancer  . Cancer Maternal Grandmother 84       brain cancer  . Cancer Sister 49       breast cancer  . Cancer Paternal Aunt        breast  . Cancer Sister   . Cancer Paternal Grandmother        Brain     Social History  Socioeconomic History  . Marital status: Married    Spouse name: Not on file  . Number of children: 2  . Years of education: Not on file  . Highest education level: Not on file  Occupational History  . 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 Financial resource strain: Not on file  . Food insecurity:    Worry: Not on file    Inability: Not on file  . Transportation needs:    Medical: Not on file    Non-medical: Not on file  Tobacco Use  . Smoking status: Never Smoker  . Smokeless tobacco: Never Used  Substance and Sexual Activity  . Alcohol use: Yes    Alcohol/week: 5.0 standard drinks    Types: 5 drink(s) per week    Comment: "  . Drug use: No  . Sexual activity: Yes  Lifestyle  . Physical activity:    Days per week: Not on file    Minutes per session: Not on file  . Stress: Not on  file  Relationships  . Social connections:    Talks on phone: Not on file    Gets together: Not on file    Attends religious service: Not on file    Active member of club or organization: Not on file    Attends meetings of clubs or organizations: Not on file    Relationship status: Not on file  . Intimate partner violence:    Fear of current or ex partner: Not on file    Emotionally abused: Not on file    Physically abused: Not on file    Forced sexual activity: Not on file  Other Topics Concern  . Not on file  Social History Narrative  . Not on file     Allergies  Allergen Reactions  . Epinephrine     REACTION: rapid heart rate  . Procaine Hcl     REACTION: rapid heart rate, heart skips beat     Prior to Admission medications   Medication Sig Start Date End Date Taking? Authorizing Provider  Barberry-Oreg Grape-Goldenseal (BERBERINE COMPLEX PO) Take by mouth.   Yes [provider]  Cholecalciferol (VITAMIN D3) 5000 UNITS TABS Take 1 tablet (5,000 Units total) by mouth 3 (three) times a week. 12/20/14  Yes Truitt Merle, MD  fish oil-omega-3 fatty acids 1000 MG capsule Take 2 g by mouth daily.   Yes [provider]  Glucosamine-Chondroitin (JOINT PAIN FORMULA PO) Take 2 capsules by mouth daily.   Yes [provider]  Multiple Vitamins-Minerals (MULTIVITAMIN WITH MINERALS) tablet Take 1 tablet by mouth daily.     Yes [provider]  non-metallic deodorant Jethro Poling) MISC Apply 1 application topically daily as needed.   Yes [provider]  thyroid (NP THYROID) 60 MG tablet TAKE 1 TABLET(60 MG) BY MOUTH DAILY BEFORE BREAKFAST 11/24/18  Yes Rutherford Guys, MD  Turmeric 500 MG CAPS Take 1 capsule by mouth daily.   Yes [provider]  ASTAXANTHIN PO Take 1 tablet by mouth daily.    [provider]  CALCIUM & MAGNESIUM CARBONATES PO Take 1,500 mg by mouth daily.      [provider]  meclizine (ANTIVERT) 25 MG tablet  Take 1 tablet (25 mg total) by mouth 4 (four) times daily. Patient not taking: Reported on 03/24/2019 02/26/13   Lacretia Leigh, MD     Depression screen [facility] 2/9 03/24/2019 11/24/2018 07/24/2017  Decreased Interest  0 0 0  Down, Depressed, Hopeless 0 0 0  PHQ - 2 Score 0 0 0     Fall Risk  03/24/2019 11/24/2018 07/24/2017  Falls in the past year? 0 0 No  Number falls in past yr: 0 - -  Injury with Fall? 0 - -      PHYSICAL EXAM: BP (!) 150/75   Ht 5' 5.5" (1.664 m)   Wt 168 lb (76.2 kg)   BMI 27.53 kg/m    Wt Readings from Last 3 Encounters:  03/24/19 168 lb (76.2 kg)  11/24/18 168 lb 12.8 oz (76.6 kg)  04/08/18 166 lb 6.4 oz (75.5 kg)     No exam data present    Physical Exam   Education/Counseling provided regarding diet and exercise, prevention of chronic diseases, smoking/tobacco cessation, if applicable, and reviewed "Covered Medicare Preventive Services."   ASSESSMENT/PLAN: 1. Medicare annual wellness visit, subsequent

## 2019-03-24 NOTE — Telephone Encounter (Signed)
Noted. BP at goal.

## 2019-03-24 NOTE — Patient Instructions (Signed)
    Thank you for taking time to come for your Medicare Wellness Visit. I appreciate your ongoing commitment to your health goals. Please review the following plan we discussed and let me know if I can assist you in the future.  Riven Mabile LPN          Healthy Eating Following a healthy eating pattern may help you to achieve and maintain a healthy body weight, reduce the risk of chronic disease, and live a long and productive life. It is important to follow a healthy eating pattern at an appropriate calorie level for your body. Your nutritional needs should be met primarily through food by choosing a variety of nutrient-rich foods. What are tips for following this plan? Reading food labels  Read labels and choose the following: ? Reduced or low sodium. ? Juices with 100% fruit juice. ? Foods with low saturated fats and high polyunsaturated and monounsaturated fats. ? Foods with whole grains, such as whole wheat, cracked wheat, brown rice, and wild rice. ? Whole grains that are fortified with folic acid. This is recommended for women who are pregnant or who want to become pregnant.  Read labels and avoid the following: ? Foods with a lot of added sugars. These include foods that contain brown sugar, corn sweetener, corn syrup, dextrose, fructose, glucose, high-fructose corn syrup, honey, invert sugar, lactose, malt syrup, maltose, molasses, raw sugar, sucrose, trehalose, or turbinado sugar.  Do not eat more than the following amounts of added sugar per day:  6 teaspoons (25 g) for women.  9 teaspoons (38 g) for men. ? Foods that contain processed or refined starches and grains. ? Refined grain products, such as white flour, degermed cornmeal, white bread, and white rice. Shopping  Choose nutrient-rich snacks, such as vegetables, whole fruits, and nuts. Avoid high-calorie and high-sugar snacks, such as potato chips, fruit snacks, and candy.  Use oil-based dressings and  spreads on foods instead of solid fats such as butter, stick margarine, or cream cheese.  Limit pre-made sauces, mixes, and "instant" products such as flavored rice, instant noodles, and ready-made pasta.  Try more plant-protein sources, such as tofu, tempeh, black beans, edamame, lentils, nuts, and seeds.  Explore eating plans such as the Mediterranean diet or vegetarian diet. Cooking  Use oil to saut or stir-fry foods instead of solid fats such as butter, stick margarine, or lard.  Try baking, boiling, grilling, or broiling instead of frying.  Remove the fatty part of meats before cooking.  Steam vegetables in water or broth. Meal planning   At meals, imagine dividing your plate into fourths: ? One-half of your plate is fruits and vegetables. ? One-fourth of your plate is whole grains. ? One-fourth of your plate is protein, especially lean meats, poultry, eggs, tofu, beans, or nuts.  Include low-fat dairy as part of your daily diet. Lifestyle  Choose healthy options in all settings, including home, work, school, restaurants, or stores.  Prepare your food safely: ? Wash your hands after handling raw meats. ? Keep food preparation surfaces clean by regularly washing with hot, soapy water. ? Keep raw meats separate from ready-to-eat foods, such as fruits and vegetables. ? Cook seafood, meat, poultry, and eggs to the recommended internal temperature. ? Store foods at safe temperatures. In general:  Keep cold foods at 40F (4.4C) or below.  Keep hot foods at 140F (60C) or above.  Keep your freezer at 0F (-17.8C) or below.  Foods are no longer safe to   4.4-60C) for more than 2 hours. What foods should I eat? Fruits Aim to eat 2 cup-equivalents of fresh, canned (in natural juice), or frozen fruits each day. Examples of 1 cup-equivalent of fruit include 1 small apple, 8 large strawberries, 1 cup canned fruit,  cup dried fruit, or 1  cup 100% juice. Vegetables Aim to eat 2-3 cup-equivalents of fresh and frozen vegetables each day, including different varieties and colors. Examples of 1 cup-equivalent of vegetables include 2 medium carrots, 2 cups raw, leafy greens, 1 cup chopped vegetable (raw or cooked), or 1 medium baked potato. Grains Aim to eat 6 ounce-equivalents of whole grains each day. Examples of 1 ounce-equivalent of grains include 1 slice of bread, 1 cup ready-to-eat cereal, 3 cups popcorn, or  cup cooked rice, pasta, or cereal. Meats and other proteins Aim to eat 5-6 ounce-equivalents of protein each day. Examples of 1 ounce-equivalent of protein include 1 egg, 1/2 cup nuts or seeds, or 1 tablespoon (16 g) peanut butter. A cut of meat or fish that is the size of a deck of cards is about 3-4 ounce-equivalents.  Of the protein you eat each week, try to have at least 8 ounces come from seafood. This includes salmon, trout, herring, and anchovies. Dairy Aim to eat 3 cup-equivalents of fat-free or low-fat dairy each day. Examples of 1 cup-equivalent of dairy include 1 cup (240 mL) milk, 8 ounces (250 g) yogurt, 1 ounces (44 g) natural cheese, or 1 cup (240 mL) fortified soy milk. Fats and oils  Aim for about 5 teaspoons (21 g) per day. Choose monounsaturated fats, such as canola and olive oils, avocados, peanut butter, and most nuts, or polyunsaturated fats, such as sunflower, corn, and soybean oils, walnuts, pine nuts, sesame seeds, sunflower seeds, and flaxseed. Beverages  Aim for six 8-oz glasses of water per day. Limit coffee to three to five 8-oz cups per day.  Limit caffeinated beverages that have added calories, such as soda and energy drinks.  Limit alcohol intake to no more than 1 drink a day for nonpregnant women and 2 drinks a day for men. One drink equals 12 oz of beer (355 mL), 5 oz of wine (148 mL), or 1 oz of hard liquor (44 mL). Seasoning and other foods  Avoid adding excess amounts of salt to  your foods. Try flavoring foods with herbs and spices instead of salt.  Avoid adding sugar to foods.  Try using oil-based dressings, sauces, and spreads instead of solid fats. This information is based on general [place] nutrition guidelines. For more information, visit [employer]es. Exact amounts may vary based on your nutrition needs. Summary  A healthy eating plan may help you to maintain a healthy weight, reduce the risk of chronic diseases, and stay active throughout your life.  Plan your meals. Make sure you eat the right portions of a variety of nutrient-rich foods.  Try baking, boiling, grilling, or broiling instead of frying.  Choose healthy options in all settings, including home, work, school, restaurants, or stores. This information is not intended to replace advice given to you by your health care provider. Make sure you discuss any questions you have with your health care provider. Document Released: 02/09/2018 Document Revised: 02/09/2018 Document Reviewed: 02/09/2018 Elsevier Interactive Patient Education  2019 [place] Directive  Advance directives are legal documents that let you make choices ahead of time about your health care and medical treatment in case you become unable to communicate for yourself. Advance directives are  you become unable to communicate for yourself. Advance directives are a way for you to communicate your wishes to family, friends, and health care providers. This can help convey your decisions about end-of-life care if you become unable to communicate. Discussing and writing advance directives should happen over time rather than all at once. Advance directives can be changed depending on your situation and what you want, even after you have signed the advance directives. If you do not have an advance directive, some states assign family decision makers to act on your behalf based on how closely you are related to them. Each state has its own laws regarding advance directives. You  may want to check with your health care provider, attorney, or state representative about the laws in your state. There are different types of advance directives, such as:  Medical power of attorney.  Living will.  Do not resuscitate (DNR) or do not attempt resuscitation (DNAR) order. Health care proxy and medical power of attorney A health care proxy, also called a health care agent, is a person who is appointed to make medical decisions for you in cases in which you are unable to make the decisions yourself. Generally, people choose someone they know well and trust to represent their preferences. Make sure to ask this person for an agreement to act as your proxy. A proxy may have to exercise judgment in the event of a medical decision for which your wishes are not known. A medical power of attorney is a legal document that names your health care proxy. Depending on the laws in your state, after the document is written, it may also need to be:  Signed.  Notarized.  Dated.  Copied.  Witnessed.  Incorporated into your medical record. You may also want to appoint someone to manage your financial affairs in a situation in which you are unable to do so. This is called a durable power of attorney for finances. It is a separate legal document from the durable power of attorney for health care. You may choose the same person or someone different from your health care proxy to act as your agent in financial matters. If you do not appoint a proxy, or if there is a concern that the proxy is not acting in your best interests, a court-appointed guardian may be designated to act on your behalf. Living will A living will is a set of instructions documenting your wishes about medical care when you cannot express them yourself. Health care providers should keep a copy of your living will in your medical record. You may want to give a copy to family members or friends. To alert caregivers in case of an  emergency, you can place a card in your wallet to let them know that you have a living will and where they can find it. A living will is used if you become:  Terminally ill.  Incapacitated.  Unable to communicate or make decisions. Items to consider in your living will include:  The use or non-use of life-sustaining equipment, such as dialysis machines and breathing machines (ventilators).  A DNR or DNAR order, which is the instruction not to use cardiopulmonary resuscitation (CPR) if breathing or heartbeat stops.  The use or non-use of tube feeding.  Withholding of food and fluids.  Comfort (palliative) care when the goal becomes comfort rather than a cure.  Organ and tissue donation. A living will does not give instructions for distributing your money and property if you should   of a lawyer when writing a will. Decisions about taxes, beneficiaries, and asset distribution will be legally binding. This process can relieve your family and friends of any concerns surrounding disputes or questions that may come up about the distribution of your assets. DNR or DNAR A DNR or DNAR order is a request not to have CPR in the event that your heart stops beating or you stop breathing. If a DNR or DNAR order has not been made and shared, a health care provider will try to help any patient whose heart has stopped or who has stopped breathing. If you plan to have surgery, talk with your health care provider about how your DNR or DNAR order will be followed if problems occur. Summary  Advance directives are the legal documents that allow you to make choices ahead of time about your health care and medical treatment in case you become unable to communicate for yourself.  The process of discussing and writing advance directives should happen over time. You can change the advance directives, even after you have signed them.  Advance directives include DNR or DNAR orders, living  wills, and designating an agent as your medical power of attorney. This information is not intended to replace advice given to you by your health care provider. Make sure you discuss any questions you have with your health care provider. Document Released: 02/04/2008 Document Revised: 09/16/2016 Document Reviewed: 09/16/2016 Elsevier Interactive Patient Education  2019 [employer].

## 2019-04-01 NOTE — Progress Notes (Signed)
Presents today for [facility] Visit  I connected with  Clydene Pugh by a telephone  telemedicine application and verified that I am speaking with the correct person using two identifiers.  The patient expressed understanding and agreed to proceed.    Date of last exam:  11/14/2018  Interpreter used for this visit?  No  Telemed two authenticators used to identify patient.  Patient Care Team: Patient, No Pcp Per as PCP - General (General Practice)   Other items to address today:   Discussed immunizations Tetanus declined due to insurance Discussed eye/dental yearly exams Mild cataracts vision at recent visit.  Discussed knee pain in L Knee scheduled an appointment 05-07-2019 for evaluation .  Previous stem cell therapy done.    ADVANCE DIRECTIVES: Discussed: yes On File: no} Materials Provided: yes (mailed)  Immunization status:   There is no immunization history on file for this patient.   Health Maintenance Due  Topic Date Due  . TETANUS/TDAP  02/17/1960     Functional Status Survey: Is the patient deaf or have difficulty hearing?: No Does the patient have difficulty seeing, even when wearing glasses/contacts?: No Does the patient have difficulty concentrating, remembering, or making decisions?: No Does the patient have difficulty walking or climbing stairs?: No Does the patient have difficulty dressing or bathing?: No Does the patient have difficulty doing errands alone such as visiting a doctor's office or shopping?: No   6CIT Screen 03/24/2019  What Year? 0 points  What month? 0 points  What time? 0 points  Count back from 20 0 points  Months in reverse 0 points  Repeat phrase 0 points  Total Score 0        Office Visit from 03/24/2019 in [facility]  AUDIT-C Score  8       Home Environment:   Lives in two story home with husband  No scattered rugs No grab bars Lighting good home  No trouble climbing  stairs   Patient Active Problem List   Diagnosis Date Noted  . Foot pain, left 08/29/2017  . 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 Capsulitis of foot 09/28/2015  . Cerebral meningioma ([facility]) 03/11/2013  . Malignant neoplasm of breast ([facility]) 09/13/2011     Past Medical History:  Diagnosis Date  . Allergy    dilantin =rash  . Arthritis    back  . Breast cancer ([facility]) 09/13/2011  . Complication of anesthesia    hard to wake up-goes out fast  . Liver hemangioma    been follow on CT scan since 2008  . Neck pain   . Thyroid disease      Past Surgical History:  Procedure Laterality Date  . ABDOMINAL HYSTERECTOMY    . BREAST SURGERY  1988   rt br bx-negative  . COLONOSCOPY    . LYMPH NODE BIOPSY  10/14/2011   Procedure: LYMPH NODE BIOPSY;  Surgeon: Edward Jolly, MD;  Location: [facility];  Service: General;  Laterality: Right;  . right foot surgery     toe implant     Family History  Problem Relation Age of Onset  . Cancer Father 68       colon cancer  . Cancer Mother 42       pancreatic cancer  . Cancer Maternal Grandmother 19       brain cancer  . Cancer Sister 63       breast cancer  .  Cancer Paternal Aunt        breast  . Cancer Sister   . Cancer Paternal Grandmother        Brain     Social History   Socioeconomic History  . Marital status: Married    Spouse name: Not on file  . Number of children: 2  . Years of education: Not on file  . Highest education level: Not on file  Occupational History  . 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 Financial resource strain: Not on file  . Food insecurity:    Worry: Not on file    Inability: Not on file  . Transportation needs:    Medical: Not on file    Non-medical: Not on file  Tobacco Use  . Smoking status: Never Smoker  . Smokeless tobacco: Never Used  Substance and Sexual Activity  . Alcohol use: Yes    Alcohol/week: 5.0 standard  drinks    Types: 5 drink(s) per week    Comment: "  . Drug use: No  . Sexual activity: Yes  Lifestyle  . Physical activity:    Days per week: Not on file    Minutes per session: Not on file  . Stress: Not on file  Relationships  . Social connections:    Talks on phone: Not on file    Gets together: Not on file    Attends religious service: Not on file    Active member of club or organization: Not on file    Attends meetings of clubs or organizations: Not on file    Relationship status: Not on file  . Intimate partner violence:    Fear of current or ex partner: Not on file    Emotionally abused: Not on file    Physically abused: Not on file    Forced sexual activity: Not on file  Other Topics Concern  . Not on file  Social History Narrative  . Not on file     Allergies  Allergen Reactions  . Epinephrine     REACTION: rapid heart rate  . Procaine Hcl     REACTION: rapid heart rate, heart skips beat     Prior to Admission medications   Medication Sig Start Date End Date Taking? Authorizing Provider  Barberry-Oreg Grape-Goldenseal (BERBERINE COMPLEX PO) Take by mouth.   Yes [provider]  Cholecalciferol (VITAMIN D3) 5000 UNITS TABS Take 1 tablet (5,000 Units total) by mouth 3 (three) times a week. 12/20/14  Yes Truitt Merle, MD  fish oil-omega-3 fatty acids 1000 MG capsule Take 2 g by mouth daily.   Yes [provider]  Glucosamine-Chondroitin (JOINT PAIN FORMULA PO) Take 2 capsules by mouth daily.   Yes [provider]  Multiple Vitamins-Minerals (MULTIVITAMIN WITH MINERALS) tablet Take 1 tablet by mouth daily.     Yes [provider]  non-metallic deodorant Jethro Poling) MISC Apply 1 application topically daily as needed.   Yes [provider]  thyroid (NP THYROID) 60 MG tablet TAKE 1 TABLET(60 MG) BY MOUTH DAILY BEFORE BREAKFAST 11/24/18  Yes Rutherford Guys, MD  Turmeric 500 MG CAPS Take 1 capsule by mouth daily.   Yes [provider]  ASTAXANTHIN PO Take 1 tablet by mouth daily.    [provider]  CALCIUM & MAGNESIUM CARBONATES PO Take 1,500 mg by mouth daily.      [provider]  meclizine (ANTIVERT) 25 MG tablet Take  1 tablet (25 mg total) by mouth 4 (four) times daily. Patient not taking: Reported on 03/24/2019 02/26/13   Lacretia Leigh, MD     Depression screen [facility] 2/9 03/24/2019 11/24/2018 07/24/2017  Decreased Interest 0 0 0  Down, Depressed, Hopeless 0 0 0  PHQ - 2 Score 0 0 0     Fall Risk  03/24/2019 11/24/2018 07/24/2017  Falls in the past year? 0 0 No  Number falls in past yr: 0 - -  Injury with Fall? 0 - -      PHYSICAL EXAM: BP (!) 150/75   Ht 5' 5.5" (1.664 m)   Wt 168 lb (76.2 kg)   BMI 27.53 kg/m    Wt Readings from Last 3 Encounters:  03/24/19 168 lb (76.2 kg)  11/24/18 168 lb 12.8 oz (76.6 kg)  04/08/18 166 lb 6.4 oz (75.5 kg)     No exam data present    Physical Exam   Education/Counseling provided regarding diet and exercise, prevention of chronic diseases, smoking/tobacco cessation, if applicable, and reviewed "Covered Medicare Preventive Services."   ASSESSMENT/PLAN: 1. Medicare annual wellness visit, subsequent

## 2019-04-20 ENCOUNTER — Other Ambulatory Visit: Payer: Self-pay

## 2019-04-20 ENCOUNTER — Encounter: Payer: Self-pay | Admitting: Podiatry

## 2019-04-20 ENCOUNTER — Ambulatory Visit (INDEPENDENT_AMBULATORY_CARE_PROVIDER_SITE_OTHER): Payer: Medicare Other | Admitting: Podiatry

## 2019-04-20 VITALS — Temp 97.7°F

## 2019-04-20 DIAGNOSIS — M778 Other enthesopathies, not elsewhere classified: Secondary | ICD-10-CM

## 2019-04-20 DIAGNOSIS — M779 Enthesopathy, unspecified: Secondary | ICD-10-CM

## 2019-04-20 DIAGNOSIS — M7671 Peroneal tendinitis, right leg: Secondary | ICD-10-CM | POA: Diagnosis not present

## 2019-04-20 NOTE — Progress Notes (Signed)
She presents today for follow-up of capsulitis to the first metatarsophalangeal joint left foot and peroneal tendinitis right ankle.  She states that the peroneal tendons are feeling much better though she would entertain an injection and then once again.  She needs another injection to the first metatarsal phalangeal joint left.  Objective: Vital signs are stable she alert and oriented x3.  Pulses are palpable.  She has tenderness on range of motion of the first metatarsophalangeal joint with limitation range of motion.  She has mild fluctuance in the tendon sheath of the Perrone is longus.  Assessment: Peroneal tendinitis resolving but still some present.  Capsulitis of the first metatarsophalangeal joint left foot.  Plan: Discussed etiology pathology conservative therapies this point time went ahead of sterile Betadine skin prep and injected 20 mg of Kenalog 5 mg Marcaine point maximal tenderness into the metatarsal phalangeal joint of the left.  Also injected 20 mg Kenalog 5 mg Marcaine around the peroneal tendons this should help alleviate the symptoms significantly I will follow-up with her in about 3 months

## 2019-05-07 ENCOUNTER — Ambulatory Visit: Payer: Medicare Other | Admitting: Family Medicine

## 2019-06-01 ENCOUNTER — Ambulatory Visit: Payer: Medicare Other | Admitting: Family Medicine

## 2019-06-29 ENCOUNTER — Ambulatory Visit: Payer: Medicare Other | Admitting: Family Medicine

## 2019-07-01 LAB — HM MAMMOGRAPHY

## 2019-07-13 ENCOUNTER — Encounter: Payer: Self-pay | Admitting: Podiatry

## 2019-07-13 ENCOUNTER — Ambulatory Visit (INDEPENDENT_AMBULATORY_CARE_PROVIDER_SITE_OTHER): Payer: Medicare Other | Admitting: Podiatry

## 2019-07-13 ENCOUNTER — Other Ambulatory Visit: Payer: Self-pay

## 2019-07-13 DIAGNOSIS — M778 Other enthesopathies, not elsewhere classified: Secondary | ICD-10-CM

## 2019-07-13 DIAGNOSIS — M779 Enthesopathy, unspecified: Secondary | ICD-10-CM

## 2019-07-13 NOTE — Progress Notes (Signed)
She presents today for follow-up of her capsulitis first metatarsal phalangeal joint of the left foot with hallux limitus.  States that it seems to be doing better at this point usually by about 3 months is starting to act up but is not very bad at this point.  States that the right ankle is doing much improvement no problems whatsoever.  Objective: Vital signs are stable she is alert and oriented x3 she has tenderness on range of motion of the first metatarsal phalangeal joint of the left foot but does not appear to be as tender as it has been in the past.  She has no pain on evaluation of the right ankle peroneal area.  Assessment: Capsulitis first metatarsophalangeal joint with hallux limitus left.  Resolution of peroneal tendinitis right.  Plan: After sterile Betadine skin prep I injected 10 mg Kenalog 5 mg of Marcaine through a 30-gauge needle into the first metatarsophalangeal joint of the left foot.  Tolerated procedure well without complications follow-up with her in 3 months.

## 2019-07-22 ENCOUNTER — Ambulatory Visit: Payer: Medicare Other | Admitting: Family Medicine

## 2019-10-12 ENCOUNTER — Encounter: Payer: Self-pay | Admitting: Podiatry

## 2019-10-12 ENCOUNTER — Other Ambulatory Visit: Payer: Self-pay

## 2019-10-12 ENCOUNTER — Ambulatory Visit (INDEPENDENT_AMBULATORY_CARE_PROVIDER_SITE_OTHER): Payer: Medicare Other | Admitting: Podiatry

## 2019-10-12 DIAGNOSIS — M7671 Peroneal tendinitis, right leg: Secondary | ICD-10-CM | POA: Diagnosis not present

## 2019-10-12 DIAGNOSIS — M778 Other enthesopathies, not elsewhere classified: Secondary | ICD-10-CM

## 2019-10-12 NOTE — Progress Notes (Signed)
She presents today for follow-up of capsulitis and hallux limitus of the first metatarsophalangeal joint of the left foot also going on to say that she is having pain to the peroneal tendons of her right ankle.  She denies any trauma.  States that just seems to be getting worse no known but had to have surgery on his left foot eventually.  Objective: Vital signs are stable she alert and oriented x3.  Pulses are palpable.  There is no edema erythema cellulitis drainage or odor to either foot though she does have fluctuance on palpation of the peroneal tendons just in the infra month malleoli region.  She also has tenderness on end range of motion of the first metatarsophalangeal joint of the left foot with mild bunion deformity.  Assessment: Capsulitis hallux limitus first metatarsophalangeal joint left foot.  Peroneal tendinitis right foot.  Plan: Discussed etiology pathology conservative surgical therapies at this point I went ahead and injected 10 mg of Kenalog 5 mg of Marcaine to the peroneal tendon sheath of the right foot after sterile Betadine skin prep.  I also injected the first metatarsophalangeal joint of the left foot after sterile Betadine skin prep with 20 mg Kenalog 5 mg of Marcaine she tolerated procedure well without complications and I will follow-up with her in 3 months.

## 2020-01-07 ENCOUNTER — Other Ambulatory Visit: Payer: Self-pay | Admitting: Family Medicine

## 2020-01-07 DIAGNOSIS — E039 Hypothyroidism, unspecified: Secondary | ICD-10-CM

## 2020-01-07 NOTE — Telephone Encounter (Signed)
Called patient scheduled med refill appt for 02/03/2020

## 2020-01-07 NOTE — Telephone Encounter (Signed)
Requested medications are due for refill today?  Yes  Requested medications are on active medication list?  Yes  Last Refill:  11/24/2018 # 90 with 3 refills  Future visit scheduled? No   Notes to Clinic:  Patient failed protocol as patient is overdue for TSH.

## 2020-01-11 ENCOUNTER — Ambulatory Visit: Payer: Medicare Other | Admitting: Podiatry

## 2020-01-18 ENCOUNTER — Other Ambulatory Visit: Payer: Self-pay

## 2020-01-18 ENCOUNTER — Ambulatory Visit (INDEPENDENT_AMBULATORY_CARE_PROVIDER_SITE_OTHER): Payer: Medicare Other | Admitting: Podiatry

## 2020-01-18 ENCOUNTER — Encounter: Payer: Self-pay | Admitting: Podiatry

## 2020-01-18 DIAGNOSIS — M778 Other enthesopathies, not elsewhere classified: Secondary | ICD-10-CM

## 2020-01-18 DIAGNOSIS — M7671 Peroneal tendinitis, right leg: Secondary | ICD-10-CM

## 2020-01-19 NOTE — Progress Notes (Signed)
She presents today states that the right foot is doing pretty well but the left big toe is still painful.  Objective: Vital signs are stable alert oriented x3.  Pulses are palpable.  She still has pain on palpation and range of motion of the first metatarsophalangeal joints left.  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  Plan: Injected the area today after sterile Betadine skin prep with Kenalog 10 mg.  Follow-up with her in 3 months

## 2020-02-03 ENCOUNTER — Ambulatory Visit: Payer: Medicare Other | Admitting: Family Medicine

## 2020-02-16 ENCOUNTER — Other Ambulatory Visit: Payer: Self-pay

## 2020-02-16 ENCOUNTER — Ambulatory Visit (INDEPENDENT_AMBULATORY_CARE_PROVIDER_SITE_OTHER): Payer: Medicare Other | Admitting: Family Medicine

## 2020-02-16 ENCOUNTER — Encounter: Payer: Self-pay | Admitting: Family Medicine

## 2020-02-16 VITALS — BP 140/70 | HR 76 | Temp 98.1°F | Ht 65.5 in | Wt 165.0 lb

## 2020-02-16 DIAGNOSIS — E039 Hypothyroidism, unspecified: Secondary | ICD-10-CM | POA: Diagnosis not present

## 2020-02-16 DIAGNOSIS — M25562 Pain in left knee: Secondary | ICD-10-CM

## 2020-02-16 DIAGNOSIS — R413 Other amnesia: Secondary | ICD-10-CM | POA: Diagnosis not present

## 2020-02-16 MED ORDER — THYROID 60 MG PO TABS: ORAL_TABLET | ORAL | 1 refills | Status: DC

## 2020-02-16 NOTE — Progress Notes (Signed)
4/7/20213:45 PM  Krista Dunn 1940/11/20, 79 y.o., female [id number]  Chief Complaint  Patient presents with  . left knee pain    ongoing but worse since 3 months     HPI:   Patient is a 79 y.o. female with past medical history significant for hypothyroidism who presents today with several concerns  Left knee pain - completed stem cell therapy, was doing really well, now doing knee injections every 3 months with flowerAmnioFio acellular liquid, most of the pain is medial aspect of knee, denies any injuries, sometimes hears knee click when she does down the stairs, no swelling or warmth, denies any give away She also has problems with left great toe, sees podiatrist  Takes armour thyroid 60mg  every day Denies missed doses Has been on current dose for years  Denies any changes in temperature tolerance Has been having hair loss, has been taking biotin Reports weight gain since covid, will be starting the gym again  Starting to forget how to get to places Denies any problems with finances house chores, family and friends  Lab Results  Component Value Date   TSH 1.530 11/24/2018   Wt Readings from Last 3 Encounters:  02/16/20 165 lb (74.8 kg)  03/24/19 168 lb (76.2 kg)  11/24/18 168 lb 12.8 oz (76.6 kg)    Depression screen [facility] 2/9 02/16/2020 03/24/2019 11/24/2018  Decreased Interest 0 0 0  Down, Depressed, Hopeless 1 0 0  PHQ - 2 Score 1 0 0    Fall Risk  02/16/2020 03/24/2019 11/24/2018 07/24/2017  Falls in the past year? 0 0 0 No  Number falls in past yr: 0 0 - -  Injury with Fall? 0 0 - -  Follow up Falls evaluation completed - - -     Allergies  Allergen Reactions  . Epinephrine     REACTION: rapid heart rate  . Procaine Hcl     REACTION: rapid heart rate, heart skips beat    Prior to Admission medications   Medication Sig Start Date End Date Taking? Authorizing Provider  ASTAXANTHIN PO Take 1 tablet by mouth daily.   Yes [provider]    Barberry-Oreg Grape-Goldenseal (BERBERINE COMPLEX PO) Take by mouth.   Yes [provider]  Cholecalciferol (VITAMIN D3) 5000 UNITS TABS Take 1 tablet (5,000 Units total) by mouth 3 (three) times a week. 12/20/14  Yes Truitt Merle, MD  fish oil-omega-3 fatty acids 1000 MG capsule Take 2 g by mouth daily.   Yes [provider]  Glucosamine-Chondroitin (JOINT PAIN FORMULA PO) Take 2 capsules by mouth daily.   Yes [provider]  meclizine (ANTIVERT) 25 MG tablet Take 1 tablet (25 mg total) by mouth 4 (four) times daily. 02/26/13  Yes Lacretia Leigh, MD  Multiple Vitamins-Minerals (MULTIVITAMIN WITH MINERALS) tablet Take 1 tablet by mouth daily.     Yes [provider]  thyroid (NP THYROID) 60 MG tablet TAKE 1 TABLET(60 MG) BY MOUTH DAILY BEFORE BREAKFAST 01/07/20  Yes Rutherford Guys, MD  Turmeric 500 MG CAPS Take 1 capsule by mouth daily.   Yes [provider]  CALCIUM & MAGNESIUM CARBONATES PO Take 1,500 mg by mouth daily.      [provider]  non-metallic deodorant Jethro Poling) MISC Apply 1 application topically daily as needed.    [provider]    Past Medical History:  Diagnosis Date  . Allergy    dilantin =rash  . Arthritis    back  .  Breast cancer ([facility]) 09/13/2011  . Complication of anesthesia    hard to wake up-goes out fast  . Liver hemangioma    been follow on CT scan since 2008  . Neck pain   . Thyroid disease     Past Surgical History:  Procedure Laterality Date  . ABDOMINAL HYSTERECTOMY    . BREAST SURGERY  1988   rt br bx-negative  . COLONOSCOPY    . LYMPH NODE BIOPSY  10/14/2011   Procedure: LYMPH NODE BIOPSY;  Surgeon: Edward Jolly, MD;  Location: [facility];  Service: General;  Laterality: Right;  . right foot surgery     toe implant    Social History   Tobacco Use  . Smoking status: Never Smoker  . Smokeless tobacco: Never Used  Substance Use Topics  . Alcohol use: Yes     Alcohol/week: 5.0 standard drinks    Types: 5 drink(s) per week    Comment: "    Family History  Problem Relation Age of Onset  . Cancer Father 30       colon cancer  . Cancer Mother 30       pancreatic cancer  . Cancer Maternal Grandmother 18       brain cancer  . Cancer Sister 64       breast cancer  . Cancer Paternal Aunt        breast  . Cancer Sister   . Cancer Paternal Grandmother        Brain    ROS Per hpi  OBJECTIVE:  [id number]   02/16/20 1523 02/16/20 1527  BP: (!) 158/74 140/70  Pulse: 76   Temp: 98.1 F (36.7 C)   TempSrc: Temporal   Weight: 165 lb (74.8 kg)   Height: 5' 5.5" (1.664 m)    Body mass index is 27.04 kg/m.   Physical Exam Vitals and nursing note reviewed.  Constitutional:      Appearance: She is well-developed.  HENT:     Head: Normocephalic and atraumatic.     Mouth/Throat:     Pharynx: No oropharyngeal exudate.  Eyes:     General: No scleral icterus.    Conjunctiva/sclera: Conjunctivae normal.     Pupils: Pupils are equal, round, and reactive to light.  Neck:     Thyroid: No thyroid mass or thyromegaly.  Cardiovascular:     Rate and Rhythm: Normal rate and regular rhythm.     Heart sounds: Normal heart sounds. No murmur. No friction rub. No gallop.   Pulmonary:     Effort: Pulmonary effort is normal.     Breath sounds: Normal breath sounds. No wheezing or rales.  Musculoskeletal:     Cervical back: Neck supple.     Left knee: No swelling, effusion or erythema. Normal range of motion. Tenderness present over the medial joint line. No LCL laxity, MCL laxity, ACL laxity or PCL laxity.Normal patellar mobility.     Instability Tests: Negative medial McMurray test and negative lateral McMurray test.  Skin:    General: Skin is warm and dry.  Neurological:     Mental Status: She is alert and oriented to person, place, and time.     Cranial Nerves: Cranial nerves are intact.     Motor: No weakness.     Gait: Gait is intact.      Deep Tendon Reflexes: Reflexes are normal and symmetric.     MOCA: 26/30, however all points missed where in the  visuospatial/executive section. (was only able to draw the clock contour correctly) She had no misses for naming, attention, language, abstraction, delayed recall and orientation Completed some college  No results found for this or any previous visit (from the past 24 hour(s)).  No results found.   ASSESSMENT and PLAN  1. Hypothyroidism, unspecified type Checking labs today, medications will be adjusted as needed.  - thyroid (NP THYROID) 60 MG tablet; TAKE 1 TABLET(60 MG) BY MOUTH DAILY BEFORE BREAKFAST - TSH; Future  2. Left medial knee pain Patient decided to cont working with current provider.  3. Memory change Patient with new onset visuospatial deficits. Labs pending. Referring to neurology for further eval and treatment.  - CBC; Future - Comprehensive metabolic panel; Future - Vitamin B12; Future - Ambulatory referral to Neurology  Return in about 3 months (around 05/17/2020).    Rutherford Guys, MD [facility] [place], [place] [address] Ph.  [phone] Fax [phone]

## 2020-02-16 NOTE — Patient Instructions (Signed)
° ° ° °  If you have lab work done today you will be contacted with your lab results within the next 2 weeks.  If you have not heard from us then please contact us. The fastest way to get your results is to register for My Chart. ° ° °IF you received an x-ray today, you will receive an invoice from [facility]. Please contact  Radiology at [phone] with questions or concerns regarding your invoice.  ° °IF you received labwork today, you will receive an invoice from [facility]. Please contact [facility] at [phone] with questions or concerns regarding your invoice.  ° °Our billing staff will not be able to assist you with questions regarding bills from these companies. ° °You will be contacted with the lab results as soon as they are available. The fastest way to get your results is to activate your My Chart account. Instructions are located on the last page of this paperwork. If you have not heard from us regarding the results in 2 weeks, please contact this office. °  ° ° ° °

## 2020-02-17 ENCOUNTER — Encounter: Payer: Self-pay | Admitting: Neurology

## 2020-03-01 ENCOUNTER — Other Ambulatory Visit: Payer: Self-pay

## 2020-03-01 ENCOUNTER — Ambulatory Visit: Payer: Medicare Other

## 2020-03-01 DIAGNOSIS — E039 Hypothyroidism, unspecified: Secondary | ICD-10-CM

## 2020-03-01 DIAGNOSIS — R413 Other amnesia: Secondary | ICD-10-CM

## 2020-03-02 LAB — COMPREHENSIVE METABOLIC PANEL
ALT: 56 IU/L — ABNORMAL HIGH (ref 0–32)
AST: 75 IU/L — ABNORMAL HIGH (ref 0–40)
Albumin/Globulin Ratio: 1.8 (ref 1.2–2.2)
Albumin: 4.5 g/dL (ref 3.7–4.7)
Alkaline Phosphatase: 122 IU/L — ABNORMAL HIGH (ref 39–117)
BUN/Creatinine Ratio: 12 (ref 12–28)
BUN: 8 mg/dL (ref 8–27)
Bilirubin Total: 1.2 mg/dL (ref 0.0–1.2)
CO2: 23 mmol/L (ref 20–29)
Calcium: 9.7 mg/dL (ref 8.7–10.3)
Chloride: 105 mmol/L (ref 96–106)
Creatinine, Ser: 0.66 mg/dL (ref 0.57–1.00)
GFR calc Af Amer: 98 mL/min/{1.73_m2} (ref >59)
GFR calc non Af Amer: 85 mL/min/{1.73_m2} (ref >59)
Globulin, Total: 2.5 g/dL (ref 1.5–4.5)
Glucose: 109 mg/dL — ABNORMAL HIGH (ref 65–99)
Potassium: 4.5 mmol/L (ref 3.5–5.2)
Sodium: 142 mmol/L (ref 134–144)
Total Protein: 7 g/dL (ref 6.0–8.5)

## 2020-03-02 LAB — CBC
Hematocrit: 41.2 % (ref 34.0–46.6)
Hemoglobin: 13.7 g/dL (ref 11.1–15.9)
MCH: 31.9 pg (ref 26.6–33.0)
MCHC: 33.3 g/dL (ref 31.5–35.7)
MCV: 96 fL (ref 79–97)
Platelets: 184 10*3/uL (ref 150–450)
RBC: 4.3 x10E6/uL (ref 3.77–5.28)
RDW: 13.5 % (ref 11.7–15.4)
WBC: 4.3 10*3/uL (ref 3.4–10.8)

## 2020-03-02 LAB — TSH: TSH: 1.4 u[IU]/mL (ref 0.450–4.500)

## 2020-03-02 LAB — VITAMIN B12: Vitamin B-12: 534 pg/mL (ref 232–1245)

## 2020-04-20 ENCOUNTER — Ambulatory Visit: Payer: Medicare Other | Admitting: Podiatry

## 2020-05-02 ENCOUNTER — Telehealth: Payer: Self-pay | Admitting: *Deleted

## 2020-05-02 NOTE — Telephone Encounter (Signed)
Schedule AWV.  

## 2020-05-16 ENCOUNTER — Ambulatory Visit (INDEPENDENT_AMBULATORY_CARE_PROVIDER_SITE_OTHER): Payer: Medicare Other | Admitting: Podiatry

## 2020-05-16 ENCOUNTER — Encounter: Payer: Self-pay | Admitting: Podiatry

## 2020-05-16 ENCOUNTER — Other Ambulatory Visit: Payer: Self-pay

## 2020-05-16 DIAGNOSIS — M778 Other enthesopathies, not elsewhere classified: Secondary | ICD-10-CM | POA: Diagnosis not present

## 2020-05-16 NOTE — Progress Notes (Signed)
She presents today for follow-up of capsulitis first metatarsophalangeal joint of the left foot states that is really starting to hurt seems like the cortisone is not affecting my knees or my toe any longer.  She states that she would like to consider surgical intervention before the end of the year.  Objective: Vital signs are stable she is alert oriented x3.  Pulses are palpable.  She has limited range of motion of the first metatarsophalangeal joint secondary to severe hallux limitus/osteoarthritis.  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  Plan: At this point I injected 10 mg of Kenalog and local anesthetic through a 30-gauge half inch needle into the first metatarsophalangeal joint after sterile Betadine skin prep.  Tolerated procedure well without complications.  I will follow-up with her in the near future to discuss surgical intervention x-rays left to be taken at that time.

## 2020-05-18 ENCOUNTER — Ambulatory Visit: Payer: Medicare Other | Admitting: Family Medicine

## 2020-05-22 ENCOUNTER — Encounter: Payer: Self-pay | Admitting: Neurology

## 2020-05-22 ENCOUNTER — Ambulatory Visit (INDEPENDENT_AMBULATORY_CARE_PROVIDER_SITE_OTHER): Payer: Medicare Other | Admitting: Neurology

## 2020-05-22 ENCOUNTER — Other Ambulatory Visit: Payer: Self-pay

## 2020-05-22 VITALS — BP 135/82 | HR 71 | Ht 65.5 in | Wt 163.0 lb

## 2020-05-22 DIAGNOSIS — D329 Benign neoplasm of meninges, unspecified: Secondary | ICD-10-CM

## 2020-05-22 DIAGNOSIS — H538 Other visual disturbances: Secondary | ICD-10-CM | POA: Diagnosis not present

## 2020-05-22 DIAGNOSIS — R413 Other amnesia: Secondary | ICD-10-CM

## 2020-05-22 DIAGNOSIS — R41842 Visuospatial deficit: Secondary | ICD-10-CM

## 2020-05-22 NOTE — Progress Notes (Signed)
NEUROLOGY CONSULTATION NOTE  Krista Dunn MRN: [id number] DOB: 11/29/1940  Referring provider: Dr. Grant Fontana Primary care provider: Dr. Grant Fontana  Reason for consult:  Memory loss  Dear Dr Pamella Pert:  Thank you for your kind referral of Krista Dunn for consultation of the above symptoms. Although her history is well known to you, please allow me to reiterate it for the purpose of our medical record. She is alone in the office today. Records and images were personally reviewed where available.   HISTORY OF PRESENT ILLNESS: This is a pleasant 79 year old right-handed woman with a history of hypothyroidism presenting for evaluation of memory loss. She feels her memory is okay. She lives with her husband. For the past 5-6 months, she has had issues while driving. She would not know where she is going and forget how to get to places. It took her 2 hours to get to her doctor's office, which was just 15 minutes away, she kept making the wrong turn and knew it was wrong. She just feels different when she gets in a car. She used to work [place], she visited her daughter one time but got confused coming home so she had to stay overnight at her daughter's home. She denies missing bill payments or medications. She has not left the stove on. She has noticed she goes to get something in a different spot. She denies difficulties with writing/spelling. There is no family history of dementia. No history of significant head injuries. She drinks a martini every night with a double shot.  She has dizziness when turning her head lying down, occurring briefly on a daily basis. She has neck pain. She has bilateral tinnitus, with ringing in the right ear getting really loud every now and then. Her left ear has a constant low-pitched ringing.She recalls doing the Epley maneuver one time. No headaches, diplopia, dysarthria/dysphagia, back pain, focal numbness/tingling/weakness, bowel/bladder dysfunction,  anosmia, or tremors. Sleep is good. Mood is pleasant most of the time. She used to work for a [employer]. .    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 Mass effect on the brain and cerebral edema. Imaging characteristics are most consistent with meningioma. 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    Laboratory Data: Lab Results  Component Value Date   TSH 1.400 03/01/2020   Lab Results  Component Value Date   [id number] 017 03/01/2020     PAST MEDICAL HISTORY: Past Medical History:  Diagnosis Date  . Allergy    dilantin =rash  . Arthritis    back  . Breast cancer ([place]) 09/13/2011  . Complication of anesthesia    hard to wake up-goes out fast  . Liver hemangioma    been follow on CT scan since 2008  . Neck pain   . Thyroid disease     PAST SURGICAL HISTORY: Past Surgical History:  Procedure Laterality Date  . ABDOMINAL HYSTERECTOMY    . BREAST SURGERY  1988   rt br bx-negative  . COLONOSCOPY    . LYMPH NODE BIOPSY  10/14/2011   Procedure: LYMPH NODE BIOPSY;  Surgeon: Edward Jolly, MD;  Location: [facility];  Service: General;  Laterality: Right;  . right foot surgery     toe implant    MEDICATIONS: Current Outpatient Medications on File Prior to Visit  Medication Sig Dispense Refill  . meclizine (ANTIVERT) 25 MG tablet Take 25 mg by mouth 3 (three) times daily as needed for dizziness.    . Nutritional Supplements (WELLNESS ESSENTIALS PO) Take 1 capsule by mouth daily. Essential 1 with Q-10    . thyroid (NP THYROID) 60 MG tablet TAKE 1 TABLET(60 MG) BY MOUTH DAILY BEFORE BREAKFAST 90 tablet 1   No current facility-administered medications on  file prior to visit.    ALLERGIES: Allergies  Allergen Reactions  . Epinephrine     REACTION: rapid heart rate  . Procaine Hcl     REACTION: rapid heart rate, heart skips beat    FAMILY HISTORY: Family History  Problem Relation Age of Onset  . Cancer Father 50       colon cancer  . Cancer Mother 54       pancreatic cancer  . Cancer Maternal Grandmother 74       brain cancer  . Cancer Sister 64       breast cancer  . Cancer Paternal Aunt        breast  . Cancer Sister   . Cancer Paternal Grandmother        Brain    SOCIAL HISTORY: Social History   Socioeconomic History  . Marital status: Married    Spouse name: Not on file  . Number of children: 2  . Years of education: Not on file  . Highest education level: Not on file  Occupational History  . 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 Smoking status: Never Smoker  . Smokeless tobacco: Never Used  Vaping Use  . Vaping Use: Never used  Substance and Sexual Activity  . Alcohol use: Yes    Alcohol/week: 5.0 standard drinks    Types: 5 drink(s) per week    Comment: "  . Drug use: No  . Sexual activity: Yes  Other Topics Concern  . Not on file  Social History Narrative   Right handed    Lives with husband    Social Determinants of Health   Financial Resource Strain:   . Difficulty of Paying Living Expenses:   Food Insecurity:   . Worried About Charity fundraiser in the Last Year:   . Arboriculturist in the Last Year:   Transportation Needs:   . Film/video editor (Medical):   Marland Kitchen Lack of Transportation (Non-Medical):   Physical Activity:   . Days of Exercise per Week:   . Minutes of Exercise per Session:   Stress:   . Feeling of Stress :   Social Connections:   . Frequency of Communication with Friends and Family:   . Frequency of Social Gatherings with Friends and Family:   . Attends Religious Services:   . Active Member of Clubs or Organizations:     . Attends Archivist Meetings:   Marland Kitchen Marital Status:   Intimate Partner Violence:   . Fear of Current or Ex-Partner:   . Emotionally Abused:   Marland Kitchen Physically Abused:   . Sexually Abused:     PHYSICAL EXAM: Vitals:   05/22/20 1401  BP: 135/82  Pulse: 71  SpO2: 94%   General: No acute distress Head:  Normocephalic/atraumatic Skin/Extremities: No rash, no edema Neurological Exam: Mental status: alert  and oriented to person, place, and time, no dysarthria or aphasia, Fund of knowledge is appropriate.  Recent and remote memory are intact.  Attention and concentration are normal.   SLUMS score 24/30 [employer] Mental Exam 05/22/2020  Weekday Correct 1  Current year 1  What state are we in? 1  Amount spent 1  Amount left 2  # of Animals 3  5 objects recall 3  Number series 2  Hour markers 1  Time correct 0  Placed X in triangle correctly 0  Largest Figure 1  Name of female 2  Date back to work 2  Type of work 2  State she lived in 2  Total score 24   Cranial nerves: CN I: not tested CN II: pupils equal, round and reactive to light, visual fields intact CN III, IV, VI:  full range of motion, no nystagmus, no ptosis CN V: facial sensation intact CN VII: upper and lower face symmetric CN VIII: hearing intact to conversation Bulk & Tone: normal, no fasciculations. Motor: 5/5 throughout with no pronator drift. Sensation: intact to light touch, cold, pin, vibration and joint position sense.  No extinction to double simultaneous stimulation.  Romberg test negative Deep Tendon Reflexes: +2 throughout Cerebellar: no incoordination on finger to nose testing Gait: narrow-based and steady, able to tandem walk adequately. Tremor: none  IMPRESSION: This is a pleasant 80 year old right-handed woman with a history of hypothyroidism, meningioma, presenting for evaluation of memory loss. On further questioning, symptoms appear to be more visuo-spatial, with difficulties  mostly noticed while driving. Her neurological exam is non-focal, SLUMS score 24/30. 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 Repeat MRI brain with and without contrast will be ordered to assess for interval change. She will be scheduled for Neurocognitive testing to further evaluate cognitive concerns. Follow-up after tests, she knows to call for any changes.   Thank you for allowing me to participate in the care of this patient. Please do not hesitate to call for any questions or concerns.   Ellouise Newer, M.D.  CC: Dr. Pamella Pert

## 2020-05-22 NOTE — Patient Instructions (Signed)
Great to meet you!  1. Schedule MRI brain with and without contrast  2. Schedule Neurocognitive testing  3. Follow-up after tests, call for any changes   RECOMMENDATIONS FOR ALL PATIENTS WITH MEMORY PROBLEMS: 1. Continue to exercise (Recommend 30 minutes of walking everyday, or 3 hours every week) 2. Increase social interactions - continue going to [place] and enjoy social gatherings with friends and family 3. Eat healthy, avoid fried foods and eat more fruits and vegetables 4. Maintain adequate blood pressure, blood sugar, and blood cholesterol level. Reducing the risk of stroke and cardiovascular disease also helps promoting better memory. 5. Avoid stressful situations. Live a simple life and avoid aggravations. Organize your time and prepare for the next day in anticipation. 6. Sleep well, avoid any interruptions of sleep and avoid any distractions in the bedroom that may interfere with adequate sleep quality 7. Avoid sugar, avoid sweets as there is a strong link between excessive sugar intake, diabetes, and cognitive impairment The Mediterranean diet has been shown to help patients reduce the risk of progressive memory disorders and reduces cardiovascular risk. This includes eating fish, eat fruits and green leafy vegetables, nuts like almonds and hazelnuts, walnuts, and also use olive oil. Avoid fast foods and fried foods as much as possible. Avoid sweets and sugar as sugar use has been linked to worsening of memory function.

## 2020-05-29 ENCOUNTER — Telehealth: Payer: Self-pay | Admitting: *Deleted

## 2020-05-29 ENCOUNTER — Telehealth: Payer: Self-pay | Admitting: Family Medicine

## 2020-05-29 NOTE — Telephone Encounter (Signed)
Pt called and can be called tomorrow till 1 to sch her AWV. Please advise.

## 2020-05-29 NOTE — Telephone Encounter (Signed)
Schedule AWV.  

## 2020-06-08 ENCOUNTER — Other Ambulatory Visit: Payer: Self-pay

## 2020-06-08 ENCOUNTER — Encounter: Payer: Self-pay | Admitting: Family Medicine

## 2020-06-08 ENCOUNTER — Ambulatory Visit (INDEPENDENT_AMBULATORY_CARE_PROVIDER_SITE_OTHER): Payer: Medicare Other | Admitting: Family Medicine

## 2020-06-08 VITALS — BP 129/70 | HR 77 | Temp 98.0°F | Ht 65.5 in | Wt 162.0 lb

## 2020-06-08 DIAGNOSIS — Z6379 Other stressful life events affecting family and household: Secondary | ICD-10-CM

## 2020-06-08 DIAGNOSIS — M542 Cervicalgia: Secondary | ICD-10-CM | POA: Diagnosis not present

## 2020-06-08 DIAGNOSIS — E039 Hypothyroidism, unspecified: Secondary | ICD-10-CM | POA: Diagnosis not present

## 2020-06-08 DIAGNOSIS — G8929 Other chronic pain: Secondary | ICD-10-CM

## 2020-06-08 MED ORDER — LEVOTHYROXINE SODIUM 112 MCG PO TABS: 112.0000 ug | ORAL_TABLET | Freq: Every day | ORAL | 0 refills | Status: DC

## 2020-06-08 NOTE — Progress Notes (Signed)
7/29/20212:22 PM  Krista Dunn 1940-12-29, 79 y.o., female [id number]  Chief Complaint  Patient presents with  . Hypothyroidism    manufacturer no longer make the brand of tsh med she is taking    HPI:   Patient is a 79 y.o. female with past medical history significant for hypothryoidism who presents today for followup  Last OV April 2021 Has been on armour thyroid 60mg  for years Pharmacy has informed her that they will not be making anymore armour thyroid She still has about 3 weeks worth of medication Has been having hair thinning and brittle nails  Having bilateral neck pain with occ spasms, chronic No midline pain No numbness, tingling or weakness of BUE  Stressed in setting of daughter and husband both with new cancer diagnosis  Has seen neuro - referred for MRI and neuropsych  Lab Results  Component Value Date   TSH 1.400 03/01/2020    Depression screen [facility] 2/9 02/16/2020 03/24/2019 11/24/2018  Decreased Interest 0 0 0  Down, Depressed, Hopeless 1 0 0  PHQ - 2 Score 1 0 0    Fall Risk  06/08/2020 05/22/2020 02/16/2020 03/24/2019 11/24/2018  Falls in the past year? 0 0 0 0 0  Number falls in past yr: 0 0 0 0 -  Injury with Fall? 0 0 0 0 -  Follow up - - Falls evaluation completed - -     Allergies  Allergen Reactions  . Epinephrine     REACTION: rapid heart rate  . Procaine Hcl     REACTION: rapid heart rate, heart skips beat    Prior to Admission medications   Medication Sig Start Date End Date Taking? Authorizing Provider  Nutritional Supplements (WELLNESS ESSENTIALS PO) Take 1 capsule by mouth daily. Essential 1 with Q-10   Yes [provider]  thyroid (NP THYROID) 60 MG tablet TAKE 1 TABLET(60 MG) BY MOUTH DAILY BEFORE BREAKFAST 02/16/20  Yes Rutherford Guys, MD  meclizine (ANTIVERT) 25 MG tablet Take 25 mg by mouth 3 (three) times daily as needed for dizziness. Patient not taking: Reported on 06/08/2020    [provider]    Past  Medical History:  Diagnosis Date  . Allergy    dilantin =rash  . Arthritis    back  . Breast cancer ([place]) 09/13/2011  . Complication of anesthesia    hard to wake up-goes out fast  . Liver hemangioma    been follow on CT scan since 2008  . Neck pain   . Thyroid disease     Past Surgical History:  Procedure Laterality Date  . ABDOMINAL HYSTERECTOMY    . BREAST SURGERY  1988   rt br bx-negative  . COLONOSCOPY    . LYMPH NODE BIOPSY  10/14/2011   Procedure: LYMPH NODE BIOPSY;  Surgeon: Edward Jolly, MD;  Location: [facility];  Service: General;  Laterality: Right;  . right foot surgery     toe implant    Social History   Tobacco Use  . Smoking status: Never Smoker  . Smokeless tobacco: Never Used  Substance Use Topics  . Alcohol use: Yes    Alcohol/week: 5.0 standard drinks    Types: 5 drink(s) per week    Comment: "    Family History  Problem Relation Age of Onset  . Cancer Father 46       colon cancer  . Cancer Mother 71       pancreatic cancer  .  Cancer Maternal Grandmother 32       brain cancer  . Cancer Sister 64       breast cancer  . Cancer Paternal Aunt        breast  . Cancer Sister   . Cancer Paternal Grandmother        Brain    Review of Systems  Constitutional: Negative for chills and fever.  Respiratory: Negative for cough and shortness of breath.   Cardiovascular: Negative for chest pain, palpitations and leg swelling.  Gastrointestinal: Negative for abdominal pain, nausea and vomiting.   Per hpi  OBJECTIVE:  Today's Vitals   06/08/20 1356  BP: (!) 129/70  Pulse: 77  Temp: 98 F (36.7 C)  SpO2: 93%  Weight: 162 lb (73.5 kg)  Height: 5' 5.5" (1.664 m)   Body mass index is 26.55 kg/m.   Physical Exam Vitals and nursing note reviewed.  Constitutional:      Appearance: She is well-developed.  HENT:     Head: Normocephalic and atraumatic.     Mouth/Throat:     Pharynx: No oropharyngeal exudate.  Eyes:      General: No scleral icterus.    Conjunctiva/sclera: Conjunctivae normal.     Pupils: Pupils are equal, round, and reactive to light.  Neck:     Thyroid: No thyroid mass, thyromegaly or thyroid tenderness.  Cardiovascular:     Rate and Rhythm: Normal rate and regular rhythm.     Heart sounds: Normal heart sounds. No murmur heard.  No friction rub. No gallop.   Pulmonary:     Effort: Pulmonary effort is normal.     Breath sounds: Normal breath sounds. No wheezing or rales.  Musculoskeletal:     Cervical back: Normal range of motion and neck supple. Muscular tenderness present. No spinous process tenderness.  Lymphadenopathy:     Cervical: No cervical adenopathy.  Skin:    General: Skin is warm and dry.  Neurological:     Mental Status: She is alert and oriented to person, place, and time.     Results for orders placed or performed in visit on 06/08/20 (from the past 24 hour(s))  TSH     Status: None   Collection Time: 06/08/20  4:35 PM  Result Value Ref Range   TSH 0.713 0.450 - 4.500 uIU/mL   Narrative   Performed at:  [address] [place], [place]  [id number] Lab Director: Rush Farmer MD, Phone:  [phone]    No results found.   ASSESSMENT and PLAN  1. Hypothyroidism, unspecified type Changing to levothyroxine given armour thyroid not available anymore. Recheck TSH in 2-3 months - TSH - at goal - TSH; Future  2. Neck pain, chronic - Ambulatory referral to Physical Therapy  3. Stress due to illness of family member Declines counseling at this time  Other orders - levothyroxine (SYNTHROID) 112 MCG tablet; Take 1 tablet (112 mcg total) by mouth daily.  Return in about 6 months (around 12/09/2020).    Rutherford Guys, MD Primary Care at [place], [place] [address] Ph.  [phone] Fax [phone]

## 2020-06-08 NOTE — Patient Instructions (Addendum)
  Please come back in 2- 3 months for lab visit only   If you have lab work done today you will be contacted with your lab results within the next 2 weeks.  If you have not heard from [place] then please contact us. The fastest way to get your results is to register for My Chart.   IF you received an x-ray today, you will receive an invoice from [facility]. Please contact [facility] at [phone] with questions or concerns regarding your invoice.   IF you received labwork today, you will receive an invoice from [facility]. Please contact [facility] at [phone] with questions or concerns regarding your invoice.   Our billing staff will not be able to assist you with questions regarding bills from these companies.  You will be contacted with the lab results as soon as they are available. The fastest way to get your results is to activate your My Chart account. Instructions are located on the last page of this paperwork. If you have not heard from [place] regarding the results in 2 weeks, please contact this office.

## 2020-06-09 ENCOUNTER — Encounter: Payer: Self-pay | Admitting: Family Medicine

## 2020-06-09 LAB — TSH: TSH: 0.713 u[IU]/mL (ref 0.450–4.500)

## 2020-06-20 ENCOUNTER — Ambulatory Visit
Admission: RE | Admit: 2020-06-20 | Discharge: 2020-06-20 | Disposition: A | Discharge status: Home / Self Care | Payer: Medicare Other | Source: Ambulatory Visit | Attending: Neurology | Admitting: Neurology

## 2020-06-20 DIAGNOSIS — R413 Other amnesia: Secondary | ICD-10-CM

## 2020-06-20 DIAGNOSIS — R41842 Visuospatial deficit: Secondary | ICD-10-CM

## 2020-06-20 DIAGNOSIS — D329 Benign neoplasm of meninges, unspecified: Secondary | ICD-10-CM

## 2020-06-20 MED ORDER — GADOBENATE DIMEGLUMINE 529 MG/ML IV SOLN
15.0000 mL | Freq: Once | INTRAVENOUS | Status: AC | PRN
  Administered 2020-06-20: 15 mL via INTRAVENOUS

## 2020-06-22 NOTE — Telephone Encounter (Signed)
Called patient and left a message for a call back.  

## 2020-06-22 NOTE — Telephone Encounter (Signed)
-----   Message from Cameron Sprang, MD sent at 06/21/2020 11:01 AM EDT ----- Pls let her know the MRI brain did not show any stroke or bleed. The meningioma is unchanged from 2014. Would recommend proceeding with Neurocognitive testing, it looks like she cancelled, would reschedule if she wishes. Thanks

## 2020-06-23 ENCOUNTER — Encounter: Payer: Medicare Other | Admitting: Psychology

## 2020-06-23 NOTE — Telephone Encounter (Signed)
-----   Message from Cameron Sprang, MD sent at 06/21/2020 11:01 AM EDT ----- Pls let her know the MRI brain did not show any stroke or bleed. The meningioma is unchanged from 2014. Would recommend proceeding with Neurocognitive testing, it looks like she cancelled, would reschedule if she wishes. Thanks

## 2020-06-23 NOTE — Telephone Encounter (Signed)
Called and spoke to patient. Informed her of results and patient voiced understanding and had no questions. Patient stated that she canceled neuro cognitive testing due to having help husband with his medical issues.

## 2020-06-29 ENCOUNTER — Encounter: Payer: Self-pay | Admitting: Genetic Counselor

## 2020-06-30 ENCOUNTER — Encounter: Payer: Medicare Other | Admitting: Psychology

## 2020-07-31 ENCOUNTER — Ambulatory Visit (INDEPENDENT_AMBULATORY_CARE_PROVIDER_SITE_OTHER): Payer: Medicare Other | Admitting: Family Medicine

## 2020-07-31 VITALS — BP 129/70 | Ht 65.5 in | Wt 162.0 lb

## 2020-07-31 DIAGNOSIS — Z0000 Encounter for general adult medical examination without abnormal findings: Secondary | ICD-10-CM

## 2020-07-31 NOTE — Patient Instructions (Signed)
Krista Kennedy LPN  Preventive Care 66 Years and Older, Female Preventive care refers to lifestyle choices and visits with your health care provider that can promote health and wellness. This includes:  A yearly physical exam. This is also called an annual well check.  Regular dental and eye exams.  Immunizations.  Screening for certain conditions.  Healthy lifestyle choices, such as diet and exercise. What can I expect for my preventive care visit? Physical exam Your health care provider will check:  Height and weight. These may be used to calculate body mass index (BMI), which is a measurement that tells if you are at a healthy weight.  Heart rate and blood pressure.  Your skin for abnormal spots. Counseling Your health care provider may ask you questions about:  Alcohol, tobacco, and drug use.  Emotional well-being.  Home and relationship well-being.  Sexual activity.  Eating habits.  History of falls.  Memory and ability to understand (cognition).  Work and work Statistician.  Pregnancy and menstrual history. What immunizations do I need?  Influenza (flu) vaccine  This is recommended every year. Tetanus, diphtheria, and pertussis (Tdap) vaccine  You may need a Td booster every 10 years. Varicella (chickenpox) vaccine  You may need this vaccine if you have not already been vaccinated. Zoster (shingles) vaccine  You may need this after age 78. Pneumococcal conjugate (PCV13) vaccine  One dose is recommended after age 78. Pneumococcal polysaccharide (PPSV23) vaccine  One dose is recommended after age 78. Measles, mumps, and rubella (MMR) vaccine  You may need at least one dose of MMR if you were born in 1957 or later. You may also need a second dose. Meningococcal conjugate (MenACWY) vaccine  You may need this if you have certain conditions. Hepatitis A vaccine  You may need this if you have certain conditions or if you travel or work in places  where you may be exposed to hepatitis A. Hepatitis B vaccine  You may need this if you have certain conditions or if you travel or work in places where you may be exposed to hepatitis B. Haemophilus influenzae type b (Hib) vaccine  You may need this if you have certain conditions. You may receive vaccines as individual doses or as more than one vaccine together in one shot (combination vaccines). Talk with your health care provider about the risks and benefits of combination vaccines. What tests do I need? Blood tests  Lipid and cholesterol levels. These may be checked every 5 years, or more frequently depending on your overall health.  Hepatitis C test.  Hepatitis B test. Screening  Lung cancer screening. You may have this screening every year starting at age 78 if you have a 30-pack-year history of smoking and currently smoke or have quit within the past 15 years.  Colorectal cancer screening. All adults should have this screening starting at age 78 and continuing until age 78. Your health care provider may recommend screening at age 78 if you are at increased risk. You will have tests every 1-10 years, depending on your results and the type of screening test.  Diabetes screening. This is done by checking your blood sugar (glucose) after you have not eaten for a while (fasting). You may have this done every 1-3 years.  Mammogram. This may be done every 1-2 years. Talk with your health care provider about how often you should have regular mammograms.  BRCA-related cancer screening. This may be done if you have a family history of breast,  ovarian, tubal, or peritoneal cancers. Other tests  Sexually transmitted disease (STD) testing.  Bone density scan. This is done to screen for osteoporosis. You may have this done starting at age 40. Follow these instructions at home: Eating and drinking  Eat a diet that includes fresh fruits and vegetables, whole grains, lean protein, and low-fat  dairy products. Limit your intake of foods with high amounts of sugar, saturated fats, and salt.  Take vitamin and mineral supplements as recommended by your health care provider.  Do not drink alcohol if your health care provider tells you not to drink.  If you drink alcohol: ? Limit how much you have to 0-1 drink a day. ? Be aware of how much alcohol is in your drink. In the [place] one drink equals one 12 oz bottle of beer (355 mL), one 5 oz glass of wine (148 mL), or one 1 oz glass of hard liquor (44 mL). Lifestyle  Take daily care of your teeth and gums.  Stay active. Exercise for at least 30 minutes on 5 or more days each week.  Do not use any products that contain nicotine or tobacco, such as cigarettes, e-cigarettes, and chewing tobacco. If you need help quitting, ask your health care provider.  If you are sexually active, practice safe sex. Use a condom or other form of protection in order to prevent STIs (sexually transmitted infections).  Talk with your health care provider about taking a low-dose aspirin or statin. What's next?  Go to your health care provider once a year for a well check visit.  Ask your health care provider how often you should have your eyes and teeth checked.  Stay up to date on all vaccines. This information is not intended to replace advice given to you by your health care provider. Make sure you discuss any questions you have with your health care provider. Document Revised: 10/22/2018 Document Reviewed: 10/22/2018 Elsevier Patient Education  2020 [employer].

## 2020-07-31 NOTE — Progress Notes (Signed)
Presents today for [employer] Visit   Date of last exam: 06-08-2020  Interpreter used for this visit? No  I connected with  Krista Dunn on 07/31/20 by a telephone and verified that I am speaking with the correct person using two identifiers.   I discussed the limitations of evaluation and management by telemedicine. The patient expressed understanding and agreed to proceed.  Patient location: home  Provider location: in office  I provided 25 minutes of non face - to - face time during this encounter.  Patient Care Team: Rutherford Guys, MD as PCP - General (Family Medicine) Cameron Sprang, MD as Consulting Physician (Neurology)   Other items to address today:  Discussed immunizations Discussed Eye/Dental Will call to schedule 6 month Thyroid check in October   Other Screening: Last screening for diabetes: 03/01/2020   ADVANCE DIRECTIVES: Discussed: yes On File: no Materials Provided: no  Immunization status:  Immunization History  Administered Date(s) Administered  . PFIZER SARS-COV-2 Vaccination 12/01/2019, 12/17/2019     Health Maintenance Due  Topic Date Due  . Hepatitis C Screening  Never done  . TETANUS/TDAP  Never done  . PNA vac Low Risk Adult (1 of 2 - PCV13) Never done     Functional Status Survey: Is the patient deaf or have difficulty hearing?: No Does the patient have difficulty seeing, even when wearing glasses/contacts?: No Does the patient have difficulty concentrating, remembering, or making decisions?: No Does the patient have difficulty walking or climbing stairs?: No Does the patient have difficulty dressing or bathing?: No Does the patient have difficulty doing errands alone such as visiting a doctor's office or shopping?: No   6CIT Screen 07/31/2020 03/24/2019  What Year? 0 points 0 points  What month? 0 points 0 points  What time? 0 points 0 points  Count back from 20 0 points 0 points  Months in  reverse 0 points 0 points  Repeat phrase 0 points 0 points  Total Score 0 0        Clinical Support from 07/31/2020 in [facility]  AUDIT-C Score 7       Home Environment:   No trouble climbing stairs Lives in two story home No scattered rugs No grab bars Adequate lighting / no clutter  Patient Active Problem List   Diagnosis Date Noted  . Foot pain, left 08/29/2017  . 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 Capsulitis of foot 09/28/2015  . Acquired hallux rigidus 09/28/2015  . Enthesopathy, unspecified 09/28/2015  . Cerebral meningioma ([place]) 03/11/2013  . Malignant neoplasm of breast ([place]) 09/13/2011     Past Medical History:  Diagnosis Date  . Allergy    dilantin =rash  . Arthritis    back  . Breast cancer ([place]) 09/13/2011  . Complication of anesthesia    hard to wake up-goes out fast  . Liver hemangioma    been follow on CT scan since 2008  . Neck pain   . Thyroid disease      Past Surgical History:  Procedure Laterality Date  . ABDOMINAL HYSTERECTOMY    . BREAST SURGERY  1988   rt br bx-negative  . COLONOSCOPY    . LYMPH NODE BIOPSY  10/14/2011   Procedure: LYMPH NODE BIOPSY;  Surgeon: Edward Jolly, MD;  Location: [place];  Service: General;  Laterality: Right;  . right foot surgery     toe implant  Family History  Problem Relation Age of Onset  . Cancer Father 72       colon cancer  . Cancer Mother 18       pancreatic cancer  . Cancer Maternal Grandmother 21       brain cancer  . Cancer Sister 36       breast cancer  . Cancer Paternal Aunt        breast  . Cancer Sister   . Cancer Paternal Grandmother        Brain     Social History   Socioeconomic History  . Marital status: Married    Spouse name: Not on file  . Number of children: 2  . Years of education: Not on file  . Highest education level: Not on file  Occupational History  . 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 Smoking status: Never Smoker  . Smokeless tobacco: Never Used  Vaping Use  . Vaping Use: Never used  Substance and Sexual Activity  . Alcohol use: Yes    Alcohol/week: 5.0 standard drinks    Types: 5 drink(s) per week    Comment: "  . Drug use: No  . Sexual activity: Yes  Other Topics Concern  . Not on file  Social History Narrative   Right handed    Lives with husband    Social Determinants of Health   Financial Resource Strain:   . Difficulty of Paying Living Expenses: Not on file  Food Insecurity:   . Worried About Charity fundraiser in the Last Year: Not on file  . Ran Out of Food in the Last Year: Not on file  Transportation Needs:   . Lack of Transportation (Medical): Not on file  . Lack of Transportation (Non-Medical): Not on file  Physical Activity:   . Days of Exercise per Week: Not on file  . Minutes of Exercise per Session: Not on file  Stress:   . Feeling of Stress : Not on file  Social Connections:   . Frequency of Communication with Friends and Family: Not on file  . Frequency of Social Gatherings with Friends and Family: Not on file  . Attends Religious Services: Not on file  . Active Member of Clubs or Organizations: Not on file  . Attends Archivist Meetings: Not on file  . Marital Status: Not on file  Intimate Partner Violence:   . Fear of Current or Ex-Partner: Not on file  . Emotionally Abused: Not on file  . Physically Abused: Not on file  . Sexually Abused: Not on file     Allergies  Allergen Reactions  . Epinephrine     REACTION: rapid heart rate  . Procaine Hcl     REACTION: rapid heart rate, heart skips beat     Prior to Admission medications   Medication Sig Start Date End Date Taking? Authorizing Provider  levothyroxine (SYNTHROID) 112 MCG tablet Take 1 tablet (112 mcg total) by mouth daily. 06/08/20  Yes Rutherford Guys, MD  Nutritional Supplements [facility] ESSENTIALS PO) Take 1  capsule by mouth daily. Essential 1 with Q-10   Yes [provider]  meclizine (ANTIVERT) 25 MG tablet Take 25 mg by mouth 3 (three) times daily as needed for dizziness. Patient not taking: Reported on 06/08/2020    [provider]     Depression screen [facility] 2/9 07/31/2020 02/16/2020 03/24/2019 11/24/2018 07/24/2017  Decreased Interest 0  0 0 0 0  Down, Depressed, Hopeless 0 1 0 0 0  PHQ - 2 Score 0 1 0 0 0     Fall Risk  07/31/2020 06/08/2020 05/22/2020 02/16/2020 03/24/2019  Falls in the past year? 0 0 0 0 0  Number falls in past yr: 0 0 0 0 0  Injury with Fall? 0 0 0 0 0  Follow up Falls evaluation completed;Education provided - - Falls evaluation completed -      PHYSICAL EXAM: BP 129/70 Comment: taken from a previous visit/ patient not in clinic  Ht 5' 5.5" (1.664 m)   Wt 162 lb (73.5 kg)   BMI 26.55 kg/m    Wt Readings from Last 3 Encounters:  07/31/20 162 lb (73.5 kg)  06/08/20 162 lb (73.5 kg)  05/22/20 163 lb (73.9 kg)  Medicare annual wellness visit, subsequent    Education/Counseling provided regarding diet and exercise, prevention of chronic diseases, smoking/tobacco cessation, if applicable, and reviewed "Covered Medicare Preventive Services."

## 2020-08-10 ENCOUNTER — Ambulatory Visit: Payer: Medicare Other | Attending: Family Medicine | Admitting: Physical Therapy

## 2020-08-10 ENCOUNTER — Other Ambulatory Visit: Payer: Self-pay

## 2020-08-10 ENCOUNTER — Encounter: Payer: Self-pay | Admitting: Physical Therapy

## 2020-08-10 DIAGNOSIS — R252 Cramp and spasm: Secondary | ICD-10-CM | POA: Diagnosis not present

## 2020-08-10 DIAGNOSIS — R293 Abnormal posture: Secondary | ICD-10-CM | POA: Diagnosis present

## 2020-08-10 DIAGNOSIS — M542 Cervicalgia: Secondary | ICD-10-CM | POA: Diagnosis present

## 2020-08-10 NOTE — Therapy (Addendum)
[facility] [address], [place], [place], [address] Phone: [phone]   Fax:  [phone]  Physical Therapy Evaluation  Patient Details  Name: Krista Dunn MRN: [id number] Date of Birth: [birth year] Referring Provider (PT): Rutherford Guys, MD   Encounter Date: 08/10/2020   PT End of Session - 08/10/20 1317    Visit Number 1    Date for PT Re-Evaluation 10/05/20    PT Start Time 1233    PT Stop Time 1315    PT Time Calculation (min) 42 min    Activity Tolerance Patient tolerated treatment well    Behavior During Therapy [facility] for tasks assessed/performed           Past Medical History:  Diagnosis Date  . Allergy    dilantin =rash  . Arthritis    back  . Breast cancer ([place]) 09/13/2011  . Complication of anesthesia    hard to wake up-goes out fast  . Liver hemangioma    been follow on CT scan since 2008  . Neck pain   . Thyroid disease     Past Surgical History:  Procedure Laterality Date  . ABDOMINAL HYSTERECTOMY    . BREAST SURGERY  1988   rt br bx-negative  . COLONOSCOPY    . LYMPH NODE BIOPSY  10/14/2011   Procedure: LYMPH NODE BIOPSY;  Surgeon: Edward Jolly, MD;  Location: [place];  Service: General;  Laterality: Right;  . right foot surgery     toe implant    There were no vitals filed for this visit.    Subjective Assessment - 08/10/20 1237    Subjective Pt states she has had neck pain for about a year. It feels like something in the neck was causing dizziness which happens sometimes for example doing yard work and leaning fowards.    Patient Stated Goals get rid of neck pain and possibly the tinitus and the dizziness    Currently in Pain? Yes    Pain Score 8     Pain Location Neck    Pain Orientation Right    Pain Descriptors / Indicators Aching    Pain Type Chronic pain    Pain Radiating Towards mid cervical down to mid shoulder blades    Pain Onset More  than a month ago    Pain Frequency Constant    Aggravating Factors  just constant nothing    Pain Relieving Factors heat and massage    Effect of Pain on Daily Activities I do everything it just hurts    Multiple Pain Sites No              [facility] PT Assessment - 08/14/20 0001      Assessment   Medical Diagnosis M54.2,G89.29 (ICD-10-CM) - Neck pain, chronic    Referring Provider (PT) Rutherford Guys, MD    Onset Date/Surgical Date --   insidious onset   Prior Therapy No      Precautions   Precautions None      Restrictions   Weight Bearing Restrictions No      Balance Screen   Has the patient fallen in the past 6 months No      [place] residence    Living Arrangements Spouse/significant other      Prior Function   Level of [place] yard work      Cognition   Overall  Cognitive Status Within Functional Limits for tasks assessed      Observation/Other Assessments   Focus on Therapeutic Outcomes (FOTO)  capture at next visit      Posture/Postural Control   Posture/Postural Control Postural limitations    Postural Limitations Increased thoracic kyphosis;Rounded Shoulders      AROM   Cervical Flexion 45    Cervical Extension 52    Cervical - Right Side Bend 30    Cervical - Left Side Bend 35    Cervical - Right Rotation 55    Cervical - Left Rotation 60      Strength   Overall Strength Comments shoulder abduction 4/5 Rt side      Palpation   Palpation comment cervical, thoracic paraspinals, UT, suboccipitals and pecstight      Ambulation/Gait   Gait Pattern Within Functional Limits                      Objective measurements completed on examination: See above findings.       [facility] Adult PT Treatment/Exercise - 08/14/20 0001      Self-Care   Self-Care --      Manual Therapy   Manual Therapy Soft tissue mobilization    Soft tissue mobilization upper traps bilat             Trigger Point Dry Needling - 08/14/20 0001    Consent Given? Yes    Education Handout Provided Yes    Muscles Treated Head and Neck Upper trapezius    Upper Trapezius Response Twitch reponse elicited;Palpable increased muscle length                PT Education - 08/14/20 1140    Education Details dry needling info    Person(s) Educated Patient    Methods Explanation;Demonstration;Handout;Verbal cues    Comprehension Verbalized understanding;Returned demonstration            PT Short Term Goals - 08/14/20 0924      PT SHORT TERM GOAL #1   Title ind with initial HEP    Time 4    Period Weeks    Status New    Target Date 09/07/20      PT SHORT TERM GOAL #2   Title Pt will report feeling 25% less spasms in neck    Time 4    Period Weeks    Status New    Target Date 09/07/20             PT Long Term Goals - 08/14/20 0911      PT LONG TERM GOAL #1   Title Pt will report at least 60% less pain    Time 8    Period Weeks    Status New    Target Date 10/05/20      PT LONG TERM GOAL #2   Title Pt will be able to look up to overhead due to extension increased to 60 deg.    Time 8    Period Weeks    Status New    Target Date 10/05/20      PT LONG TERM GOAL #3   Title FOTO < or = to the predicted limitation    Baseline has not taken    Time 8    Period Weeks    Status New    Target Date 10/05/20      PT LONG TERM GOAL #4   Title Pt will have improved cervical flexion  to at least 55 deg for improved toleration of tasks leaning fwd    Time 8    Period Weeks    Status New    Target Date 10/05/20      PT LONG TERM GOAL #5   Title Pt will be able to do yard work without dizziness due to decreased muscle spasms potentially causing cervicogenic dizziness.    Time 8    Period Weeks    Status New    Target Date 10/05/20                  Plan - 08/14/20 1131    Clinical Impression Statement Pt presents to clinic due to neck pain and  dizziness that she has been experiencing for a while and tightening.  Pt has rounded shoulders and increased thoracic kyphosis.  She has muscle tension throughout. Dry needling and STM was performed today with some success at releasing upper trap. Pt also has tenion in the thoracic and cervical muscles as well as the pecs.  She has more tension in the Rt side.  Pt is negative for radicular or nerve symptoms.  Pt did have a little dizziness after lying in a prone position but did not last long.  Pt will benefit from skilled PT to work on reduced muscle spasms and strengthening for postural deficits and other impairments mentioned above.    Rehab Potential Excellent    PT Frequency 1x / week    PT Duration 8 weeks    PT Treatment/Interventions ADLs/Self Care Home Management;Canalith Repostioning;Biofeedback;Cryotherapy;Electrical Stimulation;Moist Heat;Therapeutic activities;Neuromuscular re-education;Therapeutic exercise;Patient/family education;Manual techniques;Passive range of motion;Dry needling;Taping    PT Next Visit Plan DN upper traps, suboccipitals, thoracic multifidi; HEP - cervical ROM, thoracic ROM, pec stretch    Consulted and Agree with Plan of Care Patient           Patient will benefit from skilled therapeutic intervention in order to improve the following deficits and impairments:  Increased fascial restricitons, Decreased strength, Postural dysfunction, Pain, Impaired flexibility, Impaired tone, Increased muscle spasms  Visit Diagnosis: Cramp and spasm  Cervicalgia  Abnormal posture     Problem List Patient Active Problem List   Diagnosis Date Noted  . Foot pain, left 08/29/2017  . 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 Capsulitis of foot 09/28/2015  . Acquired hallux rigidus 09/28/2015  . Enthesopathy, unspecified 09/28/2015  . Cerebral meningioma ([place]) 03/11/2013  . Malignant neoplasm of breast ([place]) 09/13/2011    Jule Ser, PT 08/14/2020, 11:45  AM  [facility] [address] [place], [place], [address] Phone: [phone]   Fax:  [phone]  Name: Krista Dunn MRN: [id number] Date of Birth: August 31, 1941

## 2020-08-10 NOTE — Patient Instructions (Addendum)

## 2020-08-14 NOTE — Addendum Note (Signed)
Addended by: Su Hoff on: 08/14/2020 11:46 AM   Modules accepted: Orders

## 2020-08-15 ENCOUNTER — Other Ambulatory Visit: Payer: Self-pay

## 2020-08-15 ENCOUNTER — Encounter: Payer: Self-pay | Admitting: Podiatry

## 2020-08-15 ENCOUNTER — Ambulatory Visit (INDEPENDENT_AMBULATORY_CARE_PROVIDER_SITE_OTHER): Payer: Medicare Other

## 2020-08-15 ENCOUNTER — Ambulatory Visit (INDEPENDENT_AMBULATORY_CARE_PROVIDER_SITE_OTHER): Payer: Medicare Other | Admitting: Podiatry

## 2020-08-15 DIAGNOSIS — M778 Other enthesopathies, not elsewhere classified: Secondary | ICD-10-CM

## 2020-08-15 DIAGNOSIS — M7671 Peroneal tendinitis, right leg: Secondary | ICD-10-CM

## 2020-08-15 DIAGNOSIS — M205X2 Other deformities of toe(s) (acquired), left foot: Secondary | ICD-10-CM | POA: Diagnosis not present

## 2020-08-16 ENCOUNTER — Encounter: Payer: Self-pay | Admitting: Physical Therapy

## 2020-08-16 ENCOUNTER — Ambulatory Visit: Payer: Medicare Other | Attending: Family Medicine | Admitting: Physical Therapy

## 2020-08-16 DIAGNOSIS — R293 Abnormal posture: Secondary | ICD-10-CM | POA: Diagnosis present

## 2020-08-16 DIAGNOSIS — M542 Cervicalgia: Secondary | ICD-10-CM | POA: Insufficient documentation

## 2020-08-16 DIAGNOSIS — R252 Cramp and spasm: Secondary | ICD-10-CM | POA: Insufficient documentation

## 2020-08-16 NOTE — Therapy (Signed)
[facility] [address] [place], [place], [address] Phone: [phone]   Fax:  [phone]  Physical Therapy Treatment  Patient Details  Name: Krista Dunn MRN: [id number] Date of Birth: 1941-09-20 Referring Provider (PT): Rutherford Guys, MD   Encounter Date: 08/16/2020   PT End of Session - 08/16/20 1319    Visit Number 2    Date for PT Re-Evaluation 10/05/20    PT Start Time 1316    PT Stop Time 1358    PT Time Calculation (min) 42 min    Activity Tolerance Patient tolerated treatment well    Behavior During Therapy [facility] for tasks assessed/performed           Past Medical History:  Diagnosis Date  . Allergy    dilantin =rash  . Arthritis    back  . Breast cancer ([place]) 09/13/2011  . Complication of anesthesia    hard to wake up-goes out fast  . Liver hemangioma    been follow on CT scan since 2008  . Neck pain   . Thyroid disease     Past Surgical History:  Procedure Laterality Date  . ABDOMINAL HYSTERECTOMY    . BREAST SURGERY  1988   rt br bx-negative  . COLONOSCOPY    . LYMPH NODE BIOPSY  10/14/2011   Procedure: LYMPH NODE BIOPSY;  Surgeon: Edward Jolly, MD;  Location: [place];  Service: General;  Laterality: Right;  . right foot surgery     toe implant    There were no vitals filed for this visit.   Subjective Assessment - 08/16/20 1320    Subjective Pt states she felt fine after previous session    Currently in Pain? Yes    Pain Score 7     Pain Location Back    Pain Orientation Mid    Pain Descriptors / Indicators Aching    Pain Type Chronic pain    Pain Onset More than a month ago    Multiple Pain Sites No                             [facility] Adult PT Treatment/Exercise - 08/16/20 0001      Exercises   Exercises Other Exercises    Other Exercises  pec stretch in doorway      Neck Exercises: Theraband   Horizontal ABduction 20 reps    yellow in supine   Other Theraband Exercises D2 - yellow in supine - 20x      Manual Therapy   Soft tissue mobilization upper traps, thoracic paraspinals, suboccipitial, cervical paraspinals            Trigger Point Dry Needling - 08/16/20 0001    Consent Given? Yes    Education Handout Provided Previously provided    Muscles Treated Head and Neck Upper trapezius;Suboccipitals    Muscles Treated Back/Hip Thoracic multifidi    Upper Trapezius Response Twitch reponse elicited;Palpable increased muscle length    Suboccipitals Response Twitch response elicited;Palpable increased muscle length    Thoracic multifidi response Twitch response elicited;Palpable increased muscle length                PT Education - 08/16/20 1400    Education Details Access Code: [email or web address]    Person(s) Educated Patient    Methods Explanation;Demonstration;Tactile cues;Verbal cues;Handout    Comprehension Verbalized understanding;Returned demonstration  PT Short Term Goals - 08/14/20 0924      PT SHORT TERM GOAL #1   Title ind with initial HEP    Time 4    Period Weeks    Status New    Target Date 09/07/20      PT SHORT TERM GOAL #2   Title Pt will report feeling 25% less spasms in neck    Time 4    Period Weeks    Status New    Target Date 09/07/20             PT Long Term Goals - 08/14/20 0911      PT LONG TERM GOAL #1   Title Pt will report at least 60% less pain    Time 8    Period Weeks    Status New    Target Date 10/05/20      PT LONG TERM GOAL #2   Title Pt will be able to look up to overhead due to extension increased to 60 deg.    Time 8    Period Weeks    Status New    Target Date 10/05/20      PT LONG TERM GOAL #3   Title FOTO < or = to the predicted limitation    Baseline has not taken    Time 8    Period Weeks    Status New    Target Date 10/05/20      PT LONG TERM GOAL #4   Title Pt will have improved cervical flexion to at least 55 deg  for improved toleration of tasks leaning fwd    Time 8    Period Weeks    Status New    Target Date 10/05/20      PT LONG TERM GOAL #5   Title Pt will be able to do yard work without dizziness due to decreased muscle spasms potentially causing cervicogenic dizziness.    Time 8    Period Weeks    Status New    Target Date 10/05/20                 Plan - 08/16/20 1511    Clinical Impression Statement Initial HEP issued today.  Pt has less tension in upper traps and thoracic paraspinals after Dry needling and STM treatment today.  Pt able to correctly do band exercises in supine to work on posutral strength.  Continue with POC is recommended at this time.    PT Treatment/Interventions ADLs/Self Care Home Management;Canalith Repostioning;Biofeedback;Cryotherapy;Electrical Stimulation;Moist Heat;Therapeutic activities;Neuromuscular re-education;Therapeutic exercise;Patient/family education;Manual techniques;Passive range of motion;Dry needling;Taping    PT Next Visit Plan f/u on DN upper traps, suboccipitals, thoracic multifidi; f/u on HEP - add cervical ROM, thoracic ROM, pec stretch    PT Home Exercise Plan Access Code: [email or web address]    Consulted and Agree with Plan of Care Patient           Patient will benefit from skilled therapeutic intervention in order to improve the following deficits and impairments:  Increased fascial restricitons, Decreased strength, Postural dysfunction, Pain, Impaired flexibility, Impaired tone, Increased muscle spasms  Visit Diagnosis: Cramp and spasm  Cervicalgia  Abnormal posture     Problem List Patient Active Problem List   Diagnosis Date Noted  . Foot pain, left 08/29/2017  . 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 Capsulitis of foot 09/28/2015  . Acquired hallux rigidus 09/28/2015  . Enthesopathy, unspecified 09/28/2015  . Cerebral meningioma ([place]) 03/11/2013  .  Malignant neoplasm of breast ([place]) 09/13/2011    Jule Ser,  PT 08/16/2020, 3:16 PM  [facility] [address] [place], [place], [address] Phone: [phone]   Fax:  [phone]  Name: Krista Dunn MRN: [id number] Date of Birth: 08/23/1941

## 2020-08-16 NOTE — Progress Notes (Signed)
She presents today for follow-up of capsulitis first metatarsophalangeal joint hallux limitus left as well as pain to her peroneals.  States they have been bothering her recently but the first metatarsophalangeal joint has not been bothering her hardly at all.  Objective: Vital signs are stable she alert oriented x3 there is no erythema edema cellulitis drainage or odor she still has pain on palpation and range of motion of the first metatarsophalangeal joint of the left foot.  This is consistent with hallux limitus.  She also has swelling and tenderness on palpation of the peroneal tendons particularly the peroneus longus as it courses beneath the lateral malleolus.  Assessment: Peroneal tendinitis right hallux limitus left.  Plan: Injected both areas today with 10 mg Kenalog 5 mg Marcaine to the point of maximal tenderness.  Tolerated procedure well.  Follow-up with her in 1 month discussed the need for a joint replacement first metatarsophalangeal joint left.  Radiographs taken today of the right foot do demonstrate a distal hemiimplant.  In good position.

## 2020-08-16 NOTE — Patient Instructions (Signed)
Access Code: [email or web address] URL: [email or web address] Date: 08/16/2020 Prepared by: Jari Favre  Exercises Doorway Pec Stretch at 90 Degrees Abduction - 1 x daily - 7 x weekly - 3 sets - 10 reps Supine Shoulder Horizontal Abduction with Resistance - 1 x daily - 7 x weekly - 3 sets - 10 reps Standing Shoulder Diagonal Horizontal Abduction 60/120 Degrees with Resistance - 1 x daily - 7 x weekly - 3 sets - 10 reps

## 2020-08-17 ENCOUNTER — Ambulatory Visit: Payer: Medicare Other | Admitting: Physical Therapy

## 2020-08-17 ENCOUNTER — Encounter: Payer: Self-pay | Admitting: Physical Therapy

## 2020-08-17 ENCOUNTER — Other Ambulatory Visit: Payer: Self-pay

## 2020-08-17 DIAGNOSIS — R252 Cramp and spasm: Secondary | ICD-10-CM

## 2020-08-17 DIAGNOSIS — R293 Abnormal posture: Secondary | ICD-10-CM

## 2020-08-17 DIAGNOSIS — M542 Cervicalgia: Secondary | ICD-10-CM

## 2020-08-17 NOTE — Therapy (Signed)
[facility] [address], [place], [place], [address] Phone: [phone]   Fax:  [phone]  Physical Therapy Treatment  Patient Details  Name: Krista Dunn MRN: [id number] Date of Birth: 1941-05-02 Referring Provider (PT): Rutherford Guys, MD   Encounter Date: 08/17/2020   PT End of Session - 08/17/20 1237    Visit Number 3    Date for PT Re-Evaluation 10/05/20    PT Start Time 1230    PT Stop Time 1310    PT Time Calculation (min) 40 min    Activity Tolerance Patient tolerated treatment well    Behavior During Therapy [facility] for tasks assessed/performed           Past Medical History:  Diagnosis Date  . Allergy    dilantin =rash  . Arthritis    back  . Breast cancer ([place]) 09/13/2011  . Complication of anesthesia    hard to wake up-goes out fast  . Liver hemangioma    been follow on CT scan since 2008  . Neck pain   . Thyroid disease     Past Surgical History:  Procedure Laterality Date  . ABDOMINAL HYSTERECTOMY    . BREAST SURGERY  1988   rt br bx-negative  . COLONOSCOPY    . LYMPH NODE BIOPSY  10/14/2011   Procedure: LYMPH NODE BIOPSY;  Surgeon: Edward Jolly, MD;  Location: [facility];  Service: General;  Laterality: Right;  . right foot surgery     toe implant    There were no vitals filed for this visit.   Subjective Assessment - 08/17/20 1234    Subjective Pt states she feels looser and the dry needling helped    Patient Stated Goals get rid of neck pain and possibly the tinitus and the dizziness    Pain Score 6     Pain Location Back    Pain Orientation Mid    Pain Descriptors / Indicators Aching    Pain Type Chronic pain    Pain Radiating Towards mid cervical down to shoulder blades    Pain Onset More than a month ago    Pain Frequency Constant    Multiple Pain Sites No                             [facility] Adult PT Treatment/Exercise -  08/17/20 0001      Neck Exercises: Theraband   Horizontal ABduction 20 reps   yellow in supine   Other Theraband Exercises D2 - yellow in supine - 20x      Neck Exercises: Seated   Neck Retraction 5 reps    Cervical Rotation Limitations rotation with towel SNAGs - 3 x 10 sec    Shoulder Rolls --    Postural Training cues to sit up lengthen back of neck while on UBE - L1 x2/2 minutes fwd/back;     Other Seated Exercise seated thoracic flex/extension arms crossed flexion and rotaiton and ball behind back extension      Neck Exercises: Supine   Other Supine Exercise towel behind back pec stretch; towel roll with thoracic extension - 5 x 10 sec each      Neck Exercises: Sidelying   Other Sidelying Exercise sidelying open book 5 x 10 sec                    PT Short Term Goals -  08/14/20 0924      PT SHORT TERM GOAL #1   Title ind with initial HEP    Time 4    Period Weeks    Status New    Target Date 09/07/20      PT SHORT TERM GOAL #2   Title Pt will report feeling 25% less spasms in neck    Time 4    Period Weeks    Status New    Target Date 09/07/20             PT Long Term Goals - 08/14/20 0911      PT LONG TERM GOAL #1   Title Pt will report at least 60% less pain    Time 8    Period Weeks    Status New    Target Date 10/05/20      PT LONG TERM GOAL #2   Title Pt will be able to look up to overhead due to extension increased to 60 deg.    Time 8    Period Weeks    Status New    Target Date 10/05/20      PT LONG TERM GOAL #3   Title FOTO < or = to the predicted limitation    Baseline has not taken    Time 8    Period Weeks    Status New    Target Date 10/05/20      PT LONG TERM GOAL #4   Title Pt will have improved cervical flexion to at least 55 deg for improved toleration of tasks leaning fwd    Time 8    Period Weeks    Status New    Target Date 10/05/20      PT LONG TERM GOAL #5   Title Pt will be able to do yard work without  dizziness due to decreased muscle spasms potentially causing cervicogenic dizziness.    Time 8    Period Weeks    Status New    Target Date 10/05/20                 Plan - 08/17/20 1424    Clinical Impression Statement Pt responded well to dry needling yesterday and reports feeling looser and better with pain down to 6/10.  Pt tolerated stretches and exercises and able to update HEP with added ROM exercises as seen in chart.  Pt will benefit from skilled PT to continue working on cerivcal and thoracic mobility, soft tissue length, and improved posture.    PT Treatment/Interventions ADLs/Self Care Home Management;Canalith Repostioning;Biofeedback;Cryotherapy;Electrical Stimulation;Moist Heat;Therapeutic activities;Neuromuscular re-education;Therapeutic exercise;Patient/family education;Manual techniques;Passive range of motion;Dry needling;Taping    PT Next Visit Plan DN upper traps, suboccipitals, thoracic multifidi; f/u on new HEP; add bands in standing as able at subsequent sessions    PT Home Exercise Plan Access Code: [email or web address]    Consulted and Agree with Plan of Care Patient           Patient will benefit from skilled therapeutic intervention in order to improve the following deficits and impairments:  Increased fascial restricitons, Decreased strength, Postural dysfunction, Pain, Impaired flexibility, Impaired tone, Increased muscle spasms  Visit Diagnosis: Cramp and spasm  Cervicalgia  Abnormal posture     Problem List Patient Active Problem List   Diagnosis Date Noted  . Foot pain, left 08/29/2017  . 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 Capsulitis of foot 09/28/2015  . Acquired hallux rigidus 09/28/2015  . Enthesopathy, unspecified 09/28/2015  .  Cerebral meningioma ([place]) 03/11/2013  . Malignant neoplasm of breast ([place]) 09/13/2011    Jule Ser, PT 08/17/2020, 2:39 PM  [facility] [address], [place], [place], [address] Phone: [phone]   Fax:  [phone]  Name: Krista Dunn MRN: [id number] Date of Birth: 01/23/1941

## 2020-08-24 ENCOUNTER — Ambulatory Visit: Payer: Medicare Other | Admitting: Physical Therapy

## 2020-08-24 ENCOUNTER — Other Ambulatory Visit: Payer: Self-pay

## 2020-08-24 DIAGNOSIS — R252 Cramp and spasm: Secondary | ICD-10-CM

## 2020-08-24 DIAGNOSIS — R293 Abnormal posture: Secondary | ICD-10-CM

## 2020-08-24 DIAGNOSIS — M542 Cervicalgia: Secondary | ICD-10-CM

## 2020-08-24 NOTE — Therapy (Signed)
[facility] [address] [place], [place], [address] Phone: [phone]   Fax:  [phone]  Physical Therapy Treatment  Patient Details  Name: Krista Dunn MRN: [id number] Date of Birth: March 05, 1941 Referring Provider (PT): Rutherford Guys, MD   Encounter Date: 08/24/2020   PT End of Session - 08/24/20 1455    Visit Number 4    Date for PT Re-Evaluation 10/05/20    Authorization Type Medicare    PT Start Time 1517    PT Stop Time 1445    PT Time Calculation (min) 48 min    Activity Tolerance Patient tolerated treatment well           Past Medical History:  Diagnosis Date  . Allergy    dilantin =rash  . Arthritis    back  . Breast cancer ([place]) 09/13/2011  . Complication of anesthesia    hard to wake up-goes out fast  . Liver hemangioma    been follow on CT scan since 2008  . Neck pain   . Thyroid disease     Past Surgical History:  Procedure Laterality Date  . ABDOMINAL HYSTERECTOMY    . BREAST SURGERY  1988   rt br bx-negative  . COLONOSCOPY    . LYMPH NODE BIOPSY  10/14/2011   Procedure: LYMPH NODE BIOPSY;  Surgeon: Edward Jolly, MD;  Location: [facility];  Service: General;  Laterality: Right;  . right foot surgery     toe implant    There were no vitals filed for this visit.   Subjective Assessment - 08/24/20 1359    Subjective Bil lower neck (upper trap/levator scap) and pain in lower back too.    Some of the twisting ex's bother my low back.    Patient Stated Goals get rid of neck pain and possibly the tinitus and the dizziness    Currently in Pain? Yes    Pain Score 7     Pain Location Neck    Pain Orientation Right;Left;Lower    Pain Type Chronic pain    Pain Onset More than a month ago                             [facility] Adult PT Treatment/Exercise - 08/24/20 0001      Neck Exercises: Standing   Other Standing Exercises red band rows 15x      Other Standing Exercises red band bil shoulder extension 15x       Neck Exercises: Seated   Other Seated Exercise review of current HEP to determine any that might be aggravating LBP    Other Seated Exercise Thoracic extension/pec stretch with ball lower tspine 10x       Moist Heat Therapy   Number Minutes Moist Heat 3 Minutes    Moist Heat Location Cervical      Manual Therapy   Soft tissue mobilization bil upper traps, levator scap, cervical paraspinals       Neck Exercises: Stretches   Upper Trapezius Stretch Left;1 rep;30 seconds    Levator Stretch Left;1 rep;20 seconds            Trigger Point Dry Needling - 08/24/20 0001    Consent Given? Yes    Muscles Treated Head and Neck Levator scapulae;Cervical multifidi    Upper Trapezius Response Twitch reponse elicited;Palpable increased muscle length    Levator Scapulae Response Twitch response elicited;Palpable increased  muscle length    Cervical multifidi Response Palpable increased muscle length                PT Education - 08/24/20 1457    Education Details seated thoracic extension; red band rows, extensions standing    Person(s) Educated Patient    Methods Demonstration;Explanation;Handout    Comprehension Returned demonstration;Verbalized understanding            PT Short Term Goals - 08/14/20 0924      PT SHORT TERM GOAL #1   Title ind with initial HEP    Time 4    Period Weeks    Status New    Target Date 09/07/20      PT SHORT TERM GOAL #2   Title Pt will report feeling 25% less spasms in neck    Time 4    Period Weeks    Status New    Target Date 09/07/20             PT Long Term Goals - 08/14/20 0911      PT LONG TERM GOAL #1   Title Pt will report at least 60% less pain    Time 8    Period Weeks    Status New    Target Date 10/05/20      PT LONG TERM GOAL #2   Title Pt will be able to look up to overhead due to extension increased to 60 deg.    Time 8    Period Weeks     Status New    Target Date 10/05/20      PT LONG TERM GOAL #3   Title FOTO < or = to the predicted limitation    Baseline has not taken    Time 8    Period Weeks    Status New    Target Date 10/05/20      PT LONG TERM GOAL #4   Title Pt will have improved cervical flexion to at least 55 deg for improved toleration of tasks leaning fwd    Time 8    Period Weeks    Status New    Target Date 10/05/20      PT LONG TERM GOAL #5   Title Pt will be able to do yard work without dizziness due to decreased muscle spasms potentially causing cervicogenic dizziness.    Time 8    Period Weeks    Status New    Target Date 10/05/20                 Plan - 08/24/20 1405    Clinical Impression Statement Reviewed HEP and omitted 2 ex's which may be increasing her LBP.  Added additional standing band ex's with verbal cues to decrease compensatory shoulder shrug.  Multiple tender points identified in bil upper traps and levator scap muscles. She is receptive to DN and manual therapy to address these myofascial issues.  Multiple twitch responses produced indicating good prognosis for benefit.  Therapist monitoring response with all interventions.    Rehab Potential Excellent    PT Frequency 1x / week    PT Duration 8 weeks    PT Treatment/Interventions ADLs/Self Care Home Management;Canalith Repostioning;Biofeedback;Cryotherapy;Electrical Stimulation;Moist Heat;Therapeutic activities;Neuromuscular re-education;Therapeutic exercise;Patient/family education;Manual techniques;Passive range of motion;Dry needling;Taping    PT Next Visit Plan assess response to DN #3 upper trap, cervical multifidi and levators bil;  review standing red band rows, extensions;  add upper trap and levator stretching to HEP  PT Home Exercise Plan Access Code: [email or web address]           Patient will benefit from skilled therapeutic intervention in order to improve the following deficits and impairments:  Increased fascial  restricitons, Decreased strength, Postural dysfunction, Pain, Impaired flexibility, Impaired tone, Increased muscle spasms  Visit Diagnosis: Cramp and spasm  Cervicalgia  Abnormal posture     Problem List Patient Active Problem List   Diagnosis Date Noted  . Foot pain, left 08/29/2017  . 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 Capsulitis of foot 09/28/2015  . Acquired hallux rigidus 09/28/2015  . Enthesopathy, unspecified 09/28/2015  . Cerebral meningioma ([place]) 03/11/2013  . Malignant neoplasm of breast ([place]) 09/13/2011   Ruben Im, PT 08/24/20 3:03 PM Phone: [phone] Fax: [phone] Alvera Singh 08/24/2020, 3:03 PM  [facility] [address] [place], [place], [address] Phone: [phone]   Fax:  [phone]  Name: Krista Dunn MRN: [id number] Date of Birth: 14-Oct-1941

## 2020-08-24 NOTE — Patient Instructions (Signed)
Access Code: [email or web address] URL: [email or web address] Date: 08/24/2020 Prepared by: Ruben Im  Exercises Doorway Pec Stretch at 90 Degrees Abduction - 1 x daily - 7 x weekly - 3 sets - 10 reps Supine Shoulder Horizontal Abduction with Resistance - 1 x daily - 7 x weekly - 3 sets - 10 reps Standing Shoulder Diagonal Horizontal Abduction 60/120 Degrees with Resistance - 1 x daily - 7 x weekly - 3 sets - 10 reps Seated Assisted Cervical Rotation with Towel - 1 x daily - 7 x weekly - 3 sets - 10 reps Sidelying Thoracic Rotation with Open Book - 1 x daily - 7 x weekly - 5 reps - 1 sets - 10 sec hold Open Book Chest Stretch on Towel Roll - 1 x daily - 7 x weekly - 3 sets - 10 reps Seated Thoracic Self Mobilization - 1 x daily - 7 x weekly - 1 sets - 10 reps Standing Bilateral Low Shoulder Row with Anchored Resistance - 1 x daily - 7 x weekly - 1 sets - 10 reps Shoulder extension with resistance - Neutral - 1 x daily - 7 x weekly - 1 sets - 10 reps

## 2020-09-06 ENCOUNTER — Other Ambulatory Visit: Payer: Self-pay

## 2020-09-06 ENCOUNTER — Ambulatory Visit: Payer: Medicare Other | Admitting: Physical Therapy

## 2020-09-06 DIAGNOSIS — R293 Abnormal posture: Secondary | ICD-10-CM

## 2020-09-06 DIAGNOSIS — R252 Cramp and spasm: Secondary | ICD-10-CM

## 2020-09-06 DIAGNOSIS — M542 Cervicalgia: Secondary | ICD-10-CM

## 2020-09-06 NOTE — Therapy (Signed)
[facility] [address] [place], [place], [address] Phone: [phone]   Fax:  [phone]  Physical Therapy Treatment  Patient Details  Name: Krista Dunn MRN: [id number] Date of Birth: 1941/01/06 Referring Provider (PT): Myles Lipps, MD   Encounter Date: 09/06/2020   PT End of Session - 09/06/20 1718    Visit Number 5    Date for PT Re-Evaluation 10/05/20    Authorization Type Medicare    PT Start Time 1534    PT Stop Time 1617    PT Time Calculation (min) 43 min    Activity Tolerance Patient tolerated treatment well    Behavior During Therapy [facility] for tasks assessed/performed           Past Medical History:  Diagnosis Date  . Allergy    dilantin =rash  . Arthritis    back  . Breast cancer ([facility]) 09/13/2011  . Complication of anesthesia    hard to wake up-goes out fast  . Liver hemangioma    been follow on CT scan since 2008  . Neck pain   . Thyroid disease     Past Surgical History:  Procedure Laterality Date  . ABDOMINAL HYSTERECTOMY    . BREAST SURGERY  1988   rt br bx-negative  . COLONOSCOPY    . LYMPH NODE BIOPSY  10/14/2011   Procedure: LYMPH NODE BIOPSY;  Surgeon: Mariella Saa, MD;  Location: [facility];  Service: General;  Laterality: Right;  . right foot surgery     toe implant    There were no vitals filed for this visit.   Subjective Assessment - 09/06/20 1726    Subjective I feel very tight.  Have been stressed this week    Patient Stated Goals get rid of neck pain and possibly the tinitus and the dizziness    Currently in Pain? Yes    Pain Score 7     Pain Location Neck    Pain Orientation Right;Left    Pain Descriptors / Indicators Aching;Tightness    Pain Type Chronic pain    Pain Onset More than a month ago                             [facility] Adult PT Treatment/Exercise - 09/06/20 0001      Neck Exercises: Theraband   Rows  20 reps;Red    Shoulder External Rotation 20 reps   yellow   Other Theraband Exercises shoulder ext standing - 20x green      Manual Therapy   Soft tissue mobilization bil upper traps, levator scap, cervical paraspinals ' SCM and scalenes Lt side            Trigger Point Dry Needling - 09/06/20 0001    Consent Given? Yes    Education Handout Provided Previously provided    Upper Trapezius Response Twitch reponse elicited;Palpable increased muscle length    Levator Scapulae Response Twitch response elicited;Palpable increased muscle length    Cervical multifidi Response Palpable increased muscle length                  PT Short Term Goals - 09/06/20 1718      PT SHORT TERM GOAL #1   Title ind with initial HEP    Status Achieved      PT SHORT TERM GOAL #2   Title Pt will report feeling 25% less  spasms in neck    Baseline feels better after PT for a few days    Status Partially Met             PT Long Term Goals - 08/14/20 0911      PT LONG TERM GOAL #1   Title Pt will report at least 60% less pain    Time 8    Period Weeks    Status New    Target Date 10/05/20      PT LONG TERM GOAL #2   Title Pt will be able to look up to overhead due to extension increased to 60 deg.    Time 8    Period Weeks    Status New    Target Date 10/05/20      PT LONG TERM GOAL #3   Title FOTO < or = to the predicted limitation    Baseline has not taken    Time 8    Period Weeks    Status New    Target Date 10/05/20      PT LONG TERM GOAL #4   Title Pt will have improved cervical flexion to at least 55 deg for improved toleration of tasks leaning fwd    Time 8    Period Weeks    Status New    Target Date 10/05/20      PT LONG TERM GOAL #5   Title Pt will be able to do yard work without dizziness due to decreased muscle spasms potentially causing cervicogenic dizziness.    Time 8    Period Weeks    Status New    Target Date 10/05/20                 Plan  - 09/06/20 1719    Clinical Impression Statement Today's session focused on review of band ex's and adding one more for improved postural strength.  Pt continues to respond well to DN with 4th treatment today.  Will re-assess whether it is retained after 2 more visits.  Pt had tension in Lt side more than Rt today and trigger points released manually in SCM and scalenes.  Pt benefit from continued PT to work on improved soft tissue length and postural strength.    PT Treatment/Interventions ADLs/Self Care Home Management;Canalith Repostioning;Biofeedback;Cryotherapy;Electrical Stimulation;Moist Heat;Therapeutic activities;Neuromuscular re-education;Therapeutic exercise;Patient/family education;Manual techniques;Passive range of motion;Dry needling;Taping    PT Next Visit Plan assess response to DN #4 upper trap, cervical multifidi and levators bil;  review standing red band rows, extensions;  add upper trap and levator stretching to HEP    PT Home Exercise Plan Access Code: [email or web address]    Consulted and Agree with Plan of Care Patient           Patient will benefit from skilled therapeutic intervention in order to improve the following deficits and impairments:  Increased fascial restricitons, Decreased strength, Postural dysfunction, Pain, Impaired flexibility, Impaired tone, Increased muscle spasms  Visit Diagnosis: Cramp and spasm  Cervicalgia  Abnormal posture     Problem List Patient Active Problem List   Diagnosis Date Noted  . Foot pain, left 08/29/2017  . 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 Capsulitis of foot 09/28/2015  . Acquired hallux rigidus 09/28/2015  . Enthesopathy, unspecified 09/28/2015  . Cerebral meningioma ([place]) 03/11/2013  . Malignant neoplasm of breast ([place]) 09/13/2011    Krista Dunn, PT 09/06/2020, 5:26 PM  [facility] [address], [address]  [place], [place], [address] Phone: [phone]    Fax:  [phone]  Name: Krista Dunn MRN: [id number] Date of Birth: 11-19-1940

## 2020-09-21 ENCOUNTER — Other Ambulatory Visit: Payer: Self-pay

## 2020-09-21 ENCOUNTER — Ambulatory Visit: Payer: Medicare Other | Attending: Family Medicine | Admitting: Physical Therapy

## 2020-09-21 DIAGNOSIS — M542 Cervicalgia: Secondary | ICD-10-CM | POA: Insufficient documentation

## 2020-09-21 DIAGNOSIS — R293 Abnormal posture: Secondary | ICD-10-CM | POA: Insufficient documentation

## 2020-09-21 DIAGNOSIS — R252 Cramp and spasm: Secondary | ICD-10-CM | POA: Diagnosis not present

## 2020-09-21 NOTE — Patient Instructions (Signed)
VESTIBULAR PT for dizziness  Things to calm the nervous system (the brain affects [place] physically):    1) control stress  Counseling??? 2) EXERCISE!  Stress Management and Relaxation Techniques There are two divisions in the nervous system that run many of our body's "behind the scenes" functions.  The "fight or flight" nervous system, and the "calming, rest and digest" nervous system.  These two systems have opposite effects on our body organs and systems and can impact our heart rate, blood pressure, breath rate, temperature, GI function, and experience of stress or calm.    Taking time to stimulate the "calming, rest and digest" part of our nervous system can help reduce experience of symptoms of chronic pain conditions, decrease stress and anxiety, and allow [place] to feel more equipped to handle challenges.  Below are strategies, techniques, and video suggestions to help stimulate this calming system.     Ways to Calm the Nervous System Yoga Meditation Mindfulness  Stretching Exercise Deep, slow breathing into belly (diaphragmatic breathing) with focused prolonged exhale Monotasking vs Multi-tasking (do one activity daily that is simple, focused, and slowly performed) Listen to your biorhythms: sleep when tired, rise when rested, eat when hungry, stop when full, etc Social connections - seek connections with others Laughter - laughing helps stimulate our "calming" nervous system Massage - by a practitioner or self-massage (example, feet for reflexology points) Singing or humming Cold exposure - try 30 sec of cold water at the end of your shower   Meditation, Yoga, Breathing, Stretching Video Suggestions [employer] YouTube Videos: Dwain Sarna Meditation for Pelvic Floor Relaxation - [employer] YouTube video . 10-Min Breath Meditation for Pelvic Health and Healing - [employer] YouTube video . Pelvic Floor Release Stretches . Bedtime Yoga for Pelvic Tension . Pelvic  Floor Release and Inner Thigh Stretch - Yoga for Pelvic Health (approx. 40 min) Progressive Muscle Relaxation Exercises - search Virgel Bouquet exercise videos on YouTube . Focused relaxation through guided relaxation, breathing, and contractions/relaxations of various muscle groups Autogenic Relaxation Technique - search videos on YouTube . Uses body's natural relaxation response through guided meditation, inducing heaviness in body, and verbal stimuli/affirmations to create overall feeling of well-being, slowed breathing, reduced heart rate, reduced blood pressure, reduced stress/anxiety Sympathetic Breathing Meditation - search videos on YouTube . Regulate the nervous system and restore calm through focused breathing to stimulate the parasympathetic nervous system (the opposite of our "fight or flight" sympathetic nervous system) Mindfulness Meditation - search videos on YouTube . Focuses on choosing to be present in the moment, finding enjoyment in the now Diaphragmatic Breathing - search videos on YouTube Guided Imagery for Relaxation - search videos on YouTube    [facility] [address], [place],  [address] Phone # [phone] Fax [phone]

## 2020-09-21 NOTE — Therapy (Signed)
[facility] [address], [address] [place], [place], [address] Phone: [phone]   Fax:  [phone]  Physical Therapy Treatment  Patient Details  Name: Krista Dunn MRN: [id number] Date of Birth: [birth year] Referring Provider (PT): Myles Lipps, MD   Encounter Date: 09/21/2020   PT End of Session - 09/21/20 1042    Visit Number 6    Date for PT Re-Evaluation 10/05/20    Authorization Type Medicare    PT Start Time 0932    PT Stop Time 1028   DN heat   PT Time Calculation (min) 56 min    Activity Tolerance Patient tolerated treatment well           Past Medical History:  Diagnosis Date  . Allergy    dilantin =rash  . Arthritis    back  . Breast cancer ([facility]) 09/13/2011  . Complication of anesthesia    hard to wake up-goes out fast  . Liver hemangioma    been follow on CT scan since 2008  . Neck pain   . Thyroid disease     Past Surgical History:  Procedure Laterality Date  . ABDOMINAL HYSTERECTOMY    . BREAST SURGERY  1988   rt br bx-negative  . COLONOSCOPY    . LYMPH NODE BIOPSY  10/14/2011   Procedure: LYMPH NODE BIOPSY;  Surgeon: Mariella Saa, MD;  Location: [facility];  Service: General;  Laterality: Right;  . right foot surgery     toe implant    There were no vitals filed for this visit.   Subjective Assessment - 09/21/20 0936    Subjective It hurts like heck to squeeze my shoulder blades with the row.  Tightness with turning to the right on the right side. I feel better after the Dn for a few days but then it comes back.  I've been getting dizzy when getting out of bed.  Dizzy when bend over.  My PCP left so I don't know who to see.  My husband is undergoing radiation and never wants to do anything.  My daughter was just diagnosed with cancer.  I use to go to [employer] before the pandemic    Patient Stated Goals get rid of neck pain and possibly the tinitus and the  dizziness    Currently in Pain? No/denies    Pain Score 0-No pain    Pain Location Neck    Pain Orientation Right;Left    Pain Descriptors / Indicators Sore;Tightness    Pain Type Chronic pain                             [facility] Adult PT Treatment/Exercise - 09/21/20 0001      Self-Care   Self-Care ADL's;Other Self-Care Comments    Other Self-Care Comments  discussion of ex, meditation, deep breathing, imagery; getting outside to walk       Neuro Re-ed    Neuro Re-ed Details  long discussion of neuroscience of pain and hypersensitivity of CNS. lower pain threshold contributing to multi region pain      Neck Exercises: Seated   Other Seated Exercise review of HEP particularly rotation with towel     Other Seated Exercise discussion of benefits of ex for physical and mental benefits      Moist Heat Therapy   Number Minutes Moist Heat 3 Minutes    Moist Heat Location Cervical  Manual Therapy   Soft tissue mobilization bil upper traps, levators, cervical paraspinals, suboccipitals             Trigger Point Dry Needling - 09/21/20 0001    Consent Given? Yes    Upper Trapezius Response Twitch reponse elicited;Palpable increased muscle length    Suboccipitals Response Palpable increased muscle length    Levator Scapulae Response Palpable increased muscle length    Cervical multifidi Response Palpable increased muscle length                PT Education - 09/21/20 1041    Education Details stress management info;  discussion on vestibular PT referral    Person(s) Educated Patient    Methods Explanation;Demonstration;Handout    Comprehension Returned demonstration;Verbalized understanding            PT Short Term Goals - 09/06/20 1718      PT SHORT TERM GOAL #1   Title ind with initial HEP    Status Achieved      PT SHORT TERM GOAL #2   Title Pt will report feeling 25% less spasms in neck    Baseline feels better after PT for a few days     Status Partially Met             PT Long Term Goals - 08/14/20 0911      PT LONG TERM GOAL #1   Title Pt will report at least 60% less pain    Time 8    Period Weeks    Status New    Target Date 10/05/20      PT LONG TERM GOAL #2   Title Pt will be able to look up to overhead due to extension increased to 60 deg.    Time 8    Period Weeks    Status New    Target Date 10/05/20      PT LONG TERM GOAL #3   Title FOTO < or = to the predicted limitation    Baseline has not taken    Time 8    Period Weeks    Status New    Target Date 10/05/20      PT LONG TERM GOAL #4   Title Pt will have improved cervical flexion to at least 55 deg for improved toleration of tasks leaning fwd    Time 8    Period Weeks    Status New    Target Date 10/05/20      PT LONG TERM GOAL #5   Title Pt will be able to do yard work without dizziness due to decreased muscle spasms potentially causing cervicogenic dizziness.    Time 8    Period Weeks    Status New    Target Date 10/05/20                 Plan - 09/21/20 0949    Clinical Impression Statement The patient reports only short benefit from Dn and limitations in exercise performance due to increased pain.  Pain education including the impact of stress causing overactivity and increased pain sensitivity.  Discussed how this could be slowing and limiting her improvement and discussed stategies to manage stress.  She states her doctor referred her to counseling but she never went.  She has not returned to [employer] since the pandemic started but she states she may stop by there today to see their procedures.  Immediately following treatment session she reports improved mobility  and generally "feeling better."    Rehab Potential Excellent    PT Frequency 1x / week    PT Duration 8 weeks    PT Treatment/Interventions ADLs/Self Care Home Management;Canalith Repostioning;Biofeedback;Cryotherapy;Electrical Stimulation;Moist  Heat;Therapeutic activities;Neuromuscular re-education;Therapeutic exercise;Patient/family education;Manual techniques;Passive range of motion;Dry needling;Taping    PT Next Visit Plan pain education;  light gym ex (Nu-step, UBE, lat bar);  DN as needed to cervical musculature    PT Home Exercise Plan Access Code: [email or web address]    Recommended Other Services vestibular PT referral           Patient will benefit from skilled therapeutic intervention in order to improve the following deficits and impairments:  Increased fascial restricitons, Decreased strength, Postural dysfunction, Pain, Impaired flexibility, Impaired tone, Increased muscle spasms  Visit Diagnosis: Cramp and spasm  Cervicalgia  Abnormal posture     Problem List Patient Active Problem List   Diagnosis Date Noted  . Foot pain, left 08/29/2017  . 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 Capsulitis of foot 09/28/2015  . Acquired hallux rigidus 09/28/2015  . Enthesopathy, unspecified 09/28/2015  . Cerebral meningioma ([place]) 03/11/2013  . Malignant neoplasm of breast ([place]) 09/13/2011   Ruben Im, PT 09/21/20 7:42 PM Phone: [phone] Fax: [phone] Alvera Singh 09/21/2020, 7:41 PM  [facility] [address], [place], [place], [address] Phone: [phone]   Fax:  [phone]  Name: Krista Dunn MRN: [id number] Date of Birth: Oct 25, 1941

## 2020-09-28 ENCOUNTER — Encounter: Payer: Self-pay | Admitting: Physical Therapy

## 2020-09-28 ENCOUNTER — Other Ambulatory Visit: Payer: Self-pay

## 2020-09-28 ENCOUNTER — Ambulatory Visit: Payer: Medicare Other | Admitting: Physical Therapy

## 2020-09-28 DIAGNOSIS — R252 Cramp and spasm: Secondary | ICD-10-CM | POA: Diagnosis not present

## 2020-09-28 DIAGNOSIS — R293 Abnormal posture: Secondary | ICD-10-CM

## 2020-09-28 DIAGNOSIS — M542 Cervicalgia: Secondary | ICD-10-CM

## 2020-09-28 NOTE — Therapy (Signed)
[facility] [address] [place], [place], [address] Phone: [phone]   Fax:  [phone]  Physical Therapy Treatment  Patient Details  Name: Krista Dunn MRN: [id number] Date of Birth: 1941-04-09 Referring Provider (PT): Rutherford Guys, MD   Encounter Date: 09/28/2020   PT End of Session - 09/28/20 1409    Visit Number 7    Date for PT Re-Evaluation 10/05/20    Authorization Type Medicare    Progress Note Due on Visit 10    PT Start Time 1230    PT Stop Time 1314    PT Time Calculation (min) 44 min    Activity Tolerance Patient tolerated treatment well    Behavior During Therapy [facility] for tasks assessed/performed           Past Medical History:  Diagnosis Date  . Allergy    dilantin =rash  . Arthritis    back  . Breast cancer ([place]) 09/13/2011  . Complication of anesthesia    hard to wake up-goes out fast  . Liver hemangioma    been follow on CT scan since 2008  . Neck pain   . Thyroid disease     Past Surgical History:  Procedure Laterality Date  . ABDOMINAL HYSTERECTOMY    . BREAST SURGERY  1988   rt br bx-negative  . COLONOSCOPY    . LYMPH NODE BIOPSY  10/14/2011   Procedure: LYMPH NODE BIOPSY;  Surgeon: Edward Jolly, MD;  Location: [place];  Service: General;  Laterality: Right;  . right foot surgery     toe implant    There were no vitals filed for this visit.   Subjective Assessment - 09/28/20 1252    Subjective I have been feeling better overall.  Pt reports she was able to get to the gym 2x last week    Patient Stated Goals get rid of neck pain and possibly the tinitus and the dizziness    Currently in Pain? No/denies                             [facility] Adult PT Treatment/Exercise - 09/28/20 0001      Neuro Re-ed    Neuro Re-ed Details  posture and scap depression cues throughout      Neck Exercises: Standing   Other Standing  Exercises lat pull downs 15#; shoulder ext 15#; chop 15# - 30x each      Neck Exercises: Seated   Other Seated Exercise UBE L1 x 6 min fwd/back    Other Seated Exercise cross arm stretch 20 sec each side      Manual Therapy   Soft tissue mobilization bil upper traps, levators, cervical paraspinals, suboccipitals             Trigger Point Dry Needling - 09/28/20 0001    Consent Given? Yes    Upper Trapezius Response Twitch reponse elicited;Palpable increased muscle length    Suboccipitals Response Palpable increased muscle length    Levator Scapulae Response Palpable increased muscle length    Cervical multifidi Response Palpable increased muscle length                  PT Short Term Goals - 09/06/20 1718      PT SHORT TERM GOAL #1   Title ind with initial HEP    Status Achieved      PT SHORT TERM  GOAL #2   Title Pt will report feeling 25% less spasms in neck    Baseline feels better after PT for a few days    Status Partially Met             PT Long Term Goals - 08/14/20 0911      PT LONG TERM GOAL #1   Title Pt will report at least 60% less pain    Time 8    Period Weeks    Status New    Target Date 10/05/20      PT LONG TERM GOAL #2   Title Pt will be able to look up to overhead due to extension increased to 60 deg.    Time 8    Period Weeks    Status New    Target Date 10/05/20      PT LONG TERM GOAL #3   Title FOTO < or = to the predicted limitation    Baseline has not taken    Time 8    Period Weeks    Status New    Target Date 10/05/20      PT LONG TERM GOAL #4   Title Pt will have improved cervical flexion to at least 55 deg for improved toleration of tasks leaning fwd    Time 8    Period Weeks    Status New    Target Date 10/05/20      PT LONG TERM GOAL #5   Title Pt will be able to do yard work without dizziness due to decreased muscle spasms potentially causing cervicogenic dizziness.    Time 8    Period Weeks    Status New     Target Date 10/05/20                 Plan - 09/28/20 1411    Clinical Impression Statement Pt demonstrates improved posture during exercises and able to perform all exercises today without increased pain.  Pt felt good with trunk rotation when doing diagonals on power tower.  Pt had good response from dry needling and MFR today.  Pt with greater tension on Rt side into paraspinals that had palpable increase tissue length.    PT Treatment/Interventions ADLs/Self Care Home Management;Canalith Repostioning;Biofeedback;Cryotherapy;Electrical Stimulation;Moist Heat;Therapeutic activities;Neuromuscular re-education;Therapeutic exercise;Patient/family education;Manual techniques;Passive range of motion;Dry needling;Taping    PT Next Visit Plan RE-EVAL goals; continue light gym ex; nustep; UBE; lat bar, core; DN to cervical region as needed    PT Home Exercise Plan Access Code: [email or web address]    Consulted and Agree with Plan of Care Patient           Patient will benefit from skilled therapeutic intervention in order to improve the following deficits and impairments:  Increased fascial restricitons, Decreased strength, Postural dysfunction, Pain, Impaired flexibility, Impaired tone, Increased muscle spasms  Visit Diagnosis: Cramp and spasm  Cervicalgia  Abnormal posture     Problem List Patient Active Problem List   Diagnosis Date Noted  . Foot pain, left 08/29/2017  . 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 Capsulitis of foot 09/28/2015  . Acquired hallux rigidus 09/28/2015  . Enthesopathy, unspecified 09/28/2015  . Cerebral meningioma ([place]) 03/11/2013  . Malignant neoplasm of breast ([place]) 09/13/2011    Jule Ser, PT 09/28/2020, 2:28 PM  [facility] [address] [place], [place], [address] Phone: [phone]   Fax:  [phone]  Name: Krista Dunn MRN: [id number] Date  of Birth: [birth year]

## 2020-10-09 ENCOUNTER — Other Ambulatory Visit: Payer: Self-pay

## 2020-10-09 ENCOUNTER — Ambulatory Visit: Payer: Medicare Other | Admitting: Physical Therapy

## 2020-10-09 DIAGNOSIS — R252 Cramp and spasm: Secondary | ICD-10-CM

## 2020-10-09 DIAGNOSIS — R293 Abnormal posture: Secondary | ICD-10-CM

## 2020-10-09 DIAGNOSIS — M542 Cervicalgia: Secondary | ICD-10-CM

## 2020-10-10 NOTE — Therapy (Signed)
[facility] [address] [place], [place], [address] Phone: [phone]   Fax:  [phone]  Physical Therapy Treatment  Patient Details  Name: Krista Dunn MRN: [id number] Date of Birth: 02-Aug-1941 Referring Provider (PT): Rutherford Guys, MD   Encounter Date: 10/09/2020   PT End of Session - 10/09/20 1247    Visit Number 8    Date for PT Re-Evaluation 11/20/20    Authorization Type Medicare    Progress Note Due on Visit 10    PT Start Time 1236    PT Stop Time 1315    PT Time Calculation (min) 39 min    Activity Tolerance Patient tolerated treatment well    Behavior During Therapy [facility] for tasks assessed/performed           Past Medical History:  Diagnosis Date   Allergy    dilantin =rash   Arthritis    back   Breast cancer ([place]) 32/11/2246   Complication of anesthesia    hard to wake up-goes out fast   Liver hemangioma    been follow on CT scan since 2008   Neck pain    Thyroid disease     Past Surgical History:  Procedure Laterality Date   ABDOMINAL HYSTERECTOMY     BREAST SURGERY  1988   rt br bx-negative   COLONOSCOPY     LYMPH NODE BIOPSY  10/14/2011   Procedure: LYMPH NODE BIOPSY;  Surgeon: Edward Jolly, MD;  Location: [place];  Service: General;  Laterality: Right;   right foot surgery     toe implant    There were no vitals filed for this visit.   Subjective Assessment - 10/09/20 1245    Subjective Pt states she has had problems getting up in the morning.  Pt states she is feeling pulling with movements on the Rt side    Currently in Pain? No/denies              [facility] PT Assessment - 10/10/20 0001      Assessment   Medical Diagnosis M54.2,G89.29 (ICD-10-CM) - Neck pain, chronic    Referring Provider (PT) Rutherford Guys, MD      AROM   Cervical Flexion 58    Cervical Extension 46                         [facility] Adult  PT Treatment/Exercise - 10/10/20 0001      Manual Therapy   Manual Therapy Myofascial release;Soft tissue mobilization    Soft tissue mobilization thoracic paraspinals; cervical musculature    Myofascial Release liver motility for assisting Rt ribcage release; Rt upper quadrant release;            Trigger Point Dry Needling - 10/10/20 0001    Consent Given? Yes    Education Handout Provided Previously provided    Upper Trapezius Response Twitch reponse elicited;Palpable increased muscle length   Rt   Cervical multifidi Response Twitch reponse elicited;Palpable increased muscle length   bil                 PT Short Term Goals - 09/06/20 1718      PT SHORT TERM GOAL #1   Title ind with initial HEP    Status Achieved      PT SHORT TERM GOAL #2   Title Pt will report feeling 25% less spasms in neck  Baseline feels better after PT for a few days    Status Partially Met             PT Long Term Goals - 10/09/20 1239      PT LONG TERM GOAL #1   Title Pt will report at least 60% less pain    Baseline 50% improved    Status On-going      PT LONG TERM GOAL #2   Title Pt will be able to look up to overhead due to extension increased to 60 deg.    Baseline feels better; Rt side has some pain - 46 deg    Status On-going    Target Date 11/20/20      PT LONG TERM GOAL #3   Title FOTO < or = to the predicted limitation    Baseline has not taken    Status Deferred      PT LONG TERM GOAL #4   Title Pt will have improved cervical flexion to at least 55 deg for improved toleration of tasks leaning fwd    Baseline 58 deg    Status On-going    Target Date 11/20/20      PT LONG TERM GOAL #5   Title Pt will be able to do yard work without dizziness due to decreased muscle spasms potentially causing cervicogenic dizziness.    Status On-going    Target Date 11/20/20                 Plan - 10/10/20 1727    Clinical Impression Statement Pt feels 50% improved.   Pt demonstrates improved cervical ROM.  Still demonstrates slow and steady progress towards functional goals.  Pt has decreased thoracic mobility and fasial restrictions also on the Rt side that addressing will be beneficial to cervical function and increased pain free ranges.  Pt is recommended to continue skilled PT for maximum functional outcomes.    PT Treatment/Interventions ADLs/Self Care Home Management;Canalith Repostioning;Biofeedback;Cryotherapy;Electrical Stimulation;Moist Heat;Therapeutic activities;Neuromuscular re-education;Therapeutic exercise;Patient/family education;Manual techniques;Passive range of motion;Dry needling;Taping    PT Next Visit Plan DN cervical; core strength progressions with thoracic rotation; f/u on liver motility    PT Home Exercise Plan Access Code: [email or web address]    Consulted and Agree with Plan of Care Patient           Patient will benefit from skilled therapeutic intervention in order to improve the following deficits and impairments:  Increased fascial restricitons, Decreased strength, Postural dysfunction, Pain, Impaired flexibility, Impaired tone, Increased muscle spasms  Visit Diagnosis: Cramp and spasm  Cervicalgia  Abnormal posture     Problem List Patient Active Problem List   Diagnosis Date Noted   Foot pain, left 08/29/2017   Hallux limitus of left foot 09/28/2015   Capsulitis of foot 09/28/2015   Acquired hallux rigidus 09/28/2015   Enthesopathy, unspecified 09/28/2015   Cerebral meningioma ([place]) 03/11/2013   Malignant neoplasm of breast ([place]) 09/13/2011    Jule Ser, PT 10/10/2020, 5:36 PM  [facility] [address], [place], [place], [address] Phone: [phone]   Fax:  [phone]  Name: Krista Dunn MRN: [id number] Date of Birth: Dec 26, 1940

## 2020-10-10 NOTE — Addendum Note (Signed)
Addended by: Su Hoff on: 10/10/2020 05:38 PM   Modules accepted: Orders

## 2020-10-11 ENCOUNTER — Ambulatory Visit: Payer: Medicare Other | Admitting: Family Medicine

## 2020-10-13 ENCOUNTER — Encounter: Payer: Medicare Other | Admitting: Physical Therapy

## 2020-10-17 ENCOUNTER — Encounter: Payer: Medicare Other | Admitting: Physical Therapy

## 2020-10-20 ENCOUNTER — Encounter: Payer: Medicare Other | Admitting: Physical Therapy

## 2020-10-26 ENCOUNTER — Other Ambulatory Visit: Payer: Self-pay

## 2020-10-26 ENCOUNTER — Ambulatory Visit: Payer: Medicare Other | Attending: Family Medicine | Admitting: Physical Therapy

## 2020-10-26 DIAGNOSIS — R293 Abnormal posture: Secondary | ICD-10-CM | POA: Insufficient documentation

## 2020-10-26 DIAGNOSIS — R252 Cramp and spasm: Secondary | ICD-10-CM | POA: Insufficient documentation

## 2020-10-26 DIAGNOSIS — M542 Cervicalgia: Secondary | ICD-10-CM | POA: Insufficient documentation

## 2020-10-26 NOTE — Therapy (Signed)
[facility] [address], [place], [place], [address] Phone: [phone]   Fax:  [phone]  Physical Therapy Treatment/Discharge Summary   Patient Details  Name: Krista Dunn MRN: [id number] Date of Birth: 1941/07/30 Referring Provider (PT): Rutherford Guys, MD   Encounter Date: 10/26/2020   PT End of Session - 10/26/20 2031    Visit Number 9    Date for PT Re-Evaluation 11/20/20    Authorization Type Medicare    Progress Note Due on Visit 10    PT Start Time 1443    PT Stop Time 1525    PT Time Calculation (min) 42 min    Activity Tolerance Patient tolerated treatment well           Past Medical History:  Diagnosis Date   Allergy    dilantin =rash   Arthritis    back   Breast cancer ([place]) 96/12/8364   Complication of anesthesia    hard to wake up-goes out fast   Liver hemangioma    been follow on CT scan since 2008   Neck pain    Thyroid disease     Past Surgical History:  Procedure Laterality Date   ABDOMINAL HYSTERECTOMY     BREAST SURGERY  1988   rt br bx-negative   COLONOSCOPY     LYMPH NODE BIOPSY  10/14/2011   Procedure: LYMPH NODE BIOPSY;  Surgeon: Edward Jolly, MD;  Location: [facility];  Service: General;  Laterality: Right;   right foot surgery     toe implant    There were no vitals filed for this visit.   Subjective Assessment - 10/26/20 1442    Subjective I'm dizzy all the time.  I can't even look up.    I went to the pain center yesterday and got a shot in my knee.  I'm sore in the sides of my neck.  I got a Total Gym just delivered.  Having foot surgery in January.    Patient Stated Goals get rid of neck pain and possibly the tinitus and the dizziness    Currently in Pain? No/denies    Pain Score 0-No pain    Pain Type Chronic pain              [facility] PT Assessment - 10/26/20 0001      Observation/Other Assessments   Focus on  Therapeutic Outcomes (FOTO)  31%      AROM   Cervical Flexion 58    Cervical Extension 53    Cervical - Right Side Bend 45    Cervical - Left Side Bend 42    Cervical - Right Rotation 70    Cervical - Left Rotation 75                         [facility] Adult PT Treatment/Exercise - 10/26/20 0001      Self-Care   Other Self-Care Comments  discussed benefits of her newly purchased [employer]; dizziness symptoms and referral for treatment; ROM and Foto score; progress toward goals      Manual Therapy   Manual therapy comments contract relax upper trap 3x 5 sec right/left    Joint Mobilization grade 2 PA T1-C3; lateral glides C4-C7 and PA upper thoracic grade 1/2    Myofascial Release suboccipital release    Manual Traction 3x 30 sec  PT Education - 10/26/20 1512    Education Details info about vestibular PT    Person(s) Educated Patient    Methods Explanation;Handout    Comprehension Verbalized understanding            PT Short Term Goals - 10/26/20 2040      PT SHORT TERM GOAL #1   Title ind with initial HEP    Status Achieved      PT SHORT TERM GOAL #2   Title Pt will report feeling 25% less spasms in neck    Status Partially Met             PT Long Term Goals - 10/26/20 2040      PT LONG TERM GOAL #1   Title Pt will report at least 60% less pain    Status Partially Met      PT LONG TERM GOAL #2   Title Pt will be able to look up to overhead due to extension increased to 60 deg.    Status Partially Met      PT LONG TERM GOAL #3   Title FOTO < or = to the predicted limitation    Status Deferred      PT LONG TERM GOAL #4   Title Pt will have improved cervical flexion to at least 55 deg for improved toleration of tasks leaning fwd    Status Achieved      PT LONG TERM GOAL #5   Title Pt will be able to do yard work without dizziness due to decreased muscle spasms potentially causing cervicogenic dizziness.    Status Not  Met                 Plan - 10/26/20 2035    Clinical Impression Statement The patient has significantly improved cervical extension, bilateral sidebending and bilateral rotation ROM.  Her FOTO functional outcome score is 31% indicating minimal to moderate limitations.  Her primary complaint is dizziness at this point which has not improved with treatment of the cervical spine.  I have recommended she get a referral to see a PT who specializes in treating the vestibular system preferably before she has foot surgery at the end of January/February.  Recommend discharge from PT at this time with partial goals met.    Rehab Potential Excellent    PT Frequency 1x / week    PT Duration 8 weeks    PT Treatment/Interventions ADLs/Self Care Home Management;Canalith Repostioning;Biofeedback;Cryotherapy;Electrical Stimulation;Moist Heat;Therapeutic activities;Neuromuscular re-education;Therapeutic exercise;Patient/family education;Manual techniques;Passive range of motion;Dry needling;Taping    PT Home Exercise Plan Access Code: [email or web address]           Patient will benefit from skilled therapeutic intervention in order to improve the following deficits and impairments:  Increased fascial restricitons,Decreased strength,Postural dysfunction,Pain,Impaired flexibility,Impaired tone,Increased muscle spasms  Visit Diagnosis: Cramp and spasm  Cervicalgia  Abnormal posture    PHYSICAL THERAPY DISCHARGE SUMMARY  Visits from Start of Care: 9  Current functional level related to goals / functional outcomes: See clinical impressions above   Remaining deficits: As above.  Recommend getting referral for vestibular PT   Education / Equipment: HEP Plan: Patient agrees to discharge.  Patient goals were partially met. Patient is being discharged due to meeting the stated rehab goals.  ?????     Problem List Patient Active Problem List   Diagnosis Date Noted   Foot pain, left 08/29/2017    Hallux limitus of left foot 09/28/2015   Capsulitis of foot 09/28/2015  Acquired hallux rigidus 09/28/2015   Enthesopathy, unspecified 09/28/2015   Cerebral meningioma ([place]) 03/11/2013   Malignant neoplasm of breast ([place]) 09/13/2011   Ruben Im, PT 10/26/20 8:43 PM Phone: [phone] Fax: [phone] Alvera Singh 10/26/2020, 8:41 PM  [facility] [address] [place], [place], [address] Phone: [phone]   Fax:  [phone]  Name: Krista Dunn MRN: [id number] Date of Birth: Jun 01, 1941

## 2020-10-26 NOTE — Patient Instructions (Signed)
° ° °  [place]   Referral for Vestibular PT     Ruben Im PT [facility] [address], [place], [place] [address] Phone # [phone] Fax [phone]

## 2020-11-16 ENCOUNTER — Ambulatory Visit: Payer: Medicare Other | Admitting: Podiatry

## 2020-11-30 ENCOUNTER — Ambulatory Visit: Payer: Medicare Other | Admitting: Podiatry

## 2020-12-07 ENCOUNTER — Ambulatory Visit (INDEPENDENT_AMBULATORY_CARE_PROVIDER_SITE_OTHER): Payer: Medicare Other | Admitting: Podiatry

## 2020-12-07 ENCOUNTER — Encounter: Payer: Self-pay | Admitting: Podiatry

## 2020-12-07 ENCOUNTER — Other Ambulatory Visit: Payer: Self-pay

## 2020-12-07 DIAGNOSIS — M7671 Peroneal tendinitis, right leg: Secondary | ICD-10-CM

## 2020-12-07 DIAGNOSIS — M778 Other enthesopathies, not elsewhere classified: Secondary | ICD-10-CM | POA: Diagnosis not present

## 2020-12-07 MED ORDER — TRIAMCINOLONE ACETONIDE 40 MG/ML IJ SUSP
20.0000 mg | Freq: Once | INTRAMUSCULAR | Status: AC
  Administered 2020-12-07: 20 mg

## 2020-12-07 MED ORDER — DEXAMETHASONE SODIUM PHOSPHATE 120 MG/30ML IJ SOLN
2.0000 mg | Freq: Once | INTRAMUSCULAR | Status: AC
  Administered 2020-12-07: 2 mg via INTRA_ARTICULAR

## 2020-12-08 NOTE — Progress Notes (Signed)
She presents today states that the right peroneal tendons are doing much better the left ones have started to hurt a little bit in her left big toe has been bothering her.  She states that we really need to work with his left big toe at some point she like to consider doing it in March or May.  Objective: Vital signs are stable she is alert oriented x3.  She has fluctuance on palpation of the peroneal tendons just distal to the fibular malleolus.  She has pain on plantarflexion and eversion.  Consistent with tendinitis of the peroneus longus tendon.  She has pain on attempted range of motion of the first metatarsophalangeal joint of the left foot.  Assessment: Well him.  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  Plan: Discussed etiology pathology conservative surgical therapies at this point I would like to go ahead and inject the peroneal tendons with 10 mg Kenalog 5 mg Marcaine point maximal tenderness.  Also injected the first metatarsophalangeal joint 20 mg Kenalog 5 mg Marcaine through a 30-gauge half inch needle after Betadine skin prep.  Tolerated procedure well.  I will follow-up with her in a month or so for surgical consult regarding her first metatarsophalangeal joint may need to consider evaluating the peroneal tendons as well.

## 2020-12-21 ENCOUNTER — Other Ambulatory Visit: Payer: Self-pay

## 2020-12-21 ENCOUNTER — Encounter: Payer: Self-pay | Admitting: Family Medicine

## 2020-12-21 ENCOUNTER — Ambulatory Visit (INDEPENDENT_AMBULATORY_CARE_PROVIDER_SITE_OTHER): Payer: Medicare Other | Admitting: Family Medicine

## 2020-12-21 VITALS — BP 132/73 | HR 73 | Temp 98.2°F | Ht 65.5 in | Wt 161.0 lb

## 2020-12-21 DIAGNOSIS — M25562 Pain in left knee: Secondary | ICD-10-CM

## 2020-12-21 DIAGNOSIS — Z13228 Encounter for screening for other metabolic disorders: Secondary | ICD-10-CM

## 2020-12-21 DIAGNOSIS — M79672 Pain in left foot: Secondary | ICD-10-CM

## 2020-12-21 DIAGNOSIS — R42 Dizziness and giddiness: Secondary | ICD-10-CM | POA: Diagnosis not present

## 2020-12-21 DIAGNOSIS — G8929 Other chronic pain: Secondary | ICD-10-CM

## 2020-12-21 DIAGNOSIS — M25561 Pain in right knee: Secondary | ICD-10-CM

## 2020-12-21 DIAGNOSIS — R413 Other amnesia: Secondary | ICD-10-CM

## 2020-12-21 DIAGNOSIS — H9313 Tinnitus, bilateral: Secondary | ICD-10-CM

## 2020-12-21 DIAGNOSIS — E039 Hypothyroidism, unspecified: Secondary | ICD-10-CM

## 2020-12-21 DIAGNOSIS — Z1329 Encounter for screening for other suspected endocrine disorder: Secondary | ICD-10-CM | POA: Diagnosis not present

## 2020-12-21 DIAGNOSIS — Z130 Encounter for screening for diseases of the blood and blood-forming organs and certain disorders involving the immune mechanism: Secondary | ICD-10-CM

## 2020-12-21 DIAGNOSIS — M542 Cervicalgia: Secondary | ICD-10-CM

## 2020-12-21 NOTE — Progress Notes (Signed)
2/10/20223:31 PM  Krista Dunn 22-Nov-1940, 80 y.o., female [id number]  Chief Complaint  Patient presents with  . getting tired quicker, low energy     Would like to thyroid level   . Dizziness    Has constant ringing in ears , need foot surgery of the left foot and needs to stabilize issues  . referral to PT     HPI:   Patient is a 80 y.o. female with past medical history significant for hypothyroidism who presents today for follow up.  Has a treadmill, elliptical and total gym Trying to increase fitness Wt Readings from Last 3 Encounters:  12/21/20 161 lb (73 kg)  07/31/20 162 lb (73.5 kg)  06/08/20 162 lb (73.5 kg)    Hypothyroid Last OV changed from armour thyroid to levothyroxine 174mg Lab Results  Component Value Date   TSH 0.713 06/08/2020    Has Left foot pain Needs an implant placed Sees podiatry for that  K[facility] Has knee braces she can wear at home Will have another injection in April  Tinnitus and Dizziness Both ears Supposed to have foot surgery Awaiting anaesthesia until finds out what is wrong Has seen neurology in the past Was supposed to do neuro cognitive testing but never went MRI done in the past:  06/20/2020: No acute or reversible finding. 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 Mass-effect upon the under surface of the right frontal lobe with mild adjacent vasogenic edema, similar to the previous study.  Mild chronic small-vessel ischemic change of the cerebral hemispheric white matter, slightly progressive since 2014. Years ago ENT: no issues Has Meclizine prescribed Some days are better than others.  Has been going to PT for neck pain Had went away and now started again [facility]  Topic Date Due  . Hepatitis C Screening  Never  done  . TETANUS/TDAP  Never done  . PNA vac Low Risk Adult (1 of 2 - PCV13) Never done  . COVID-19 Vaccine (3 - Inadvertent risk 4-dose series) 01/14/2020  . INFLUENZA VACCINE  02/08/2021 (Originally 06/11/2020)  . DEXA SCAN  Completed     [facility] 12/21/2020 07/31/2020 02/16/2020  Decreased Interest 0 0 0  Down, Depressed, Hopeless 0 0 1  PHQ - 2 Score 0 0 1    Fall Risk  12/21/2020 07/31/2020 06/08/2020 05/22/2020 02/16/2020  Falls in the past year? 0 0 0 0 0  Number falls in past yr: 0 0 0 0 0  Injury with Fall? 0 0 0 0 0  Follow up Falls evaluation completed Falls evaluation completed;Education provided - - Falls evaluation completed     Allergies  Allergen Reactions  . Epinephrine     REACTION: rapid heart rate  . Procaine Hcl     REACTION: rapid heart rate, heart skips beat    Prior to Admission medications   Medication Sig Start Date End Date Taking? Authorizing Provider  levothyroxine (SYNTHROID) 112 MCG tablet Take 1 tablet (112 mcg total) by mouth daily. 06/08/20   SJacelyn Pi ILilia Argue MD  meclizine (ANTIVERT) 25 MG tablet Take 25 mg by mouth 3 (three) times daily as needed for dizziness. Patient not taking: Reported on 06/08/2020    [provider]  Nutritional Supplements (WELLNESS ESSENTIALS PO) Take 1 capsule by mouth daily. Essential  1 with Q-10    [provider]  triamcinolone (KENALOG) 0.1 % 1 application 11/12/55   [provider]    Past Medical History:  Diagnosis Date  . Allergy    dilantin =rash  . Arthritis    back  . Breast cancer ([place]) 09/13/2011  . Complication of anesthesia    hard to wake up-goes out fast  . Liver hemangioma    been follow on CT scan since 2008  . Neck pain   . Thyroid disease     Past Surgical History:  Procedure Laterality Date  . ABDOMINAL HYSTERECTOMY    . BREAST SURGERY  1988   rt br bx-negative  . COLONOSCOPY    . LYMPH NODE BIOPSY  10/14/2011   Procedure: LYMPH NODE BIOPSY;   Surgeon: Edward Jolly, MD;  Location: [facility];  Service: General;  Laterality: Right;  . right foot surgery     toe implant    Social History   Tobacco Use  . Smoking status: Never Smoker  . Smokeless tobacco: Never Used  Substance Use Topics  . Alcohol use: Yes    Alcohol/week: 5.0 standard drinks    Types: 5 drink(s) per week    Comment: "    Family History  Problem Relation Age of Onset  . Cancer Father 92       colon cancer  . Cancer Mother 55       pancreatic cancer  . Cancer Maternal Grandmother 80       brain cancer  . Cancer Sister 2       breast cancer  . Cancer Paternal Aunt        breast  . Cancer Sister   . Cancer Paternal Grandmother        Brain    Review of Systems  Constitutional: Negative for chills, fever and malaise/fatigue.  HENT: Positive for tinnitus. Negative for ear discharge, ear pain and hearing loss.   Eyes: Negative for blurred vision and double vision.  Respiratory: Negative for cough, shortness of breath and wheezing.   Cardiovascular: Negative for chest pain, palpitations and leg swelling.  Gastrointestinal: Negative for abdominal pain, blood in stool, constipation, diarrhea, heartburn, nausea and vomiting.  Genitourinary: Negative for dysuria, frequency and hematuria.  Musculoskeletal: Positive for joint pain (Left foot bilateral knees). Negative for back pain.  Skin: Negative for rash.  Neurological: Positive for dizziness. Negative for tingling, sensory change, focal weakness, weakness and headaches.  Psychiatric/Behavioral: Positive for memory loss.     OBJECTIVE:  Today's Vitals   12/21/20 1447  BP: 132/73  Pulse: 73  Temp: 98.2 F (36.8 C)  SpO2: 97%  Weight: 161 lb (73 kg)  Height: 5' 5.5" (1.664 m)   Body mass index is 26.38 kg/m.   Physical Exam Constitutional:      General: She is not in acute distress.    Appearance: Normal appearance. She is not ill-appearing.  HENT:     Head:  Normocephalic.     Right Ear: Tympanic membrane, ear canal and external ear normal. There is no impacted cerumen.     Left Ear: Tympanic membrane, ear canal and external ear normal. There is no impacted cerumen.  Eyes:     Extraocular Movements: Extraocular movements intact.     Conjunctiva/sclera: Conjunctivae normal.     Pupils: Pupils are equal, round, and reactive to light.  Cardiovascular:     Rate and Rhythm: Normal rate and regular rhythm.  Pulses: Normal pulses.     Heart sounds: Normal heart sounds. No murmur heard. No friction rub. No gallop.   Pulmonary:     Effort: Pulmonary effort is normal. No respiratory distress.     Breath sounds: Normal breath sounds. No stridor. No wheezing, rhonchi or rales.  Abdominal:     General: Bowel sounds are normal.     Palpations: Abdomen is soft.     Tenderness: There is no abdominal tenderness.  Musculoskeletal:     Right knee: Normal.     Left knee: Normal.     Right lower leg: No edema.     Left lower leg: No edema.     Right foot: Normal.     Left foot: Normal.  Skin:    General: Skin is warm and dry.  Neurological:     Mental Status: She is alert and oriented to person, place, and time.  Psychiatric:        Mood and Affect: Mood normal.        Behavior: Behavior normal.     No results found for this or any previous visit (from the past 24 hour(s)).  No results found.   ASSESSMENT and PLAN  Problem List Items Addressed This Visit      Other   Foot pain, left    Other Visit Diagnoses    Hypothyroidism, unspecified type    -  Primary   Relevant Orders   TSH   Screening for endocrine, metabolic and immunity disorder       Relevant Orders   CMP14+EGFR   Lipid Panel   Tinnitus of both ears       Relevant Orders   Ambulatory referral to Physical Therapy   Dizziness       Memory change       Neck pain, chronic       Chronic pain of both knees          Plan . Continue [facility] for neck  pain . Referral sent to neuro PT for for dizziness . Follow up with Ortho for knee injections . Follow up with podiatry for foot surgery . Will follow up with lab results . Urged follow up with neuro and to complete neurocognitive test as recommended: contact info provided: Dr. Melvyn Novas: [phone]   Return in about 3 months (around 03/20/2021).    Huston Foley Shahida Schnackenberg, FNP-BC Primary Care at [facility] [place], [place] [address] Ph.  [phone] Fax [phone]

## 2020-12-21 NOTE — Patient Instructions (Addendum)
Neurocognitive testing Dr. Melvyn Novas: [place] Maintenance After Age 79 After age 49, you are at a higher risk for certain long-term diseases and infections as well as injuries from falls. Falls are a major cause of broken bones and head injuries in people who are older than age 73. Getting regular preventive care can help to keep you healthy and well. Preventive care includes getting regular testing and making lifestyle changes as recommended by your health care provider. Talk with your health care provider about:  Which screenings and tests you should have. A screening is a test that checks for a disease when you have no symptoms.  A diet and exercise plan that is right for you. What should I know about screenings and tests to prevent falls? Screening and testing are the best ways to find a health problem early. Early diagnosis and treatment give you the best chance of managing medical conditions that are common after age 75. Certain conditions and lifestyle choices may make you more likely to have a fall. Your health care provider may recommend:  Regular vision checks. Poor vision and conditions such as cataracts can make you more likely to have a fall. If you wear glasses, make sure to get your prescription updated if your vision changes.  Medicine review. Work with your health care provider to regularly review all of the medicines you are taking, including over-the-counter medicines. Ask your health care provider about any side effects that may make you more likely to have a fall. Tell your health care provider if any medicines that you take make you feel dizzy or sleepy.  Osteoporosis screening. Osteoporosis is a condition that causes the bones to get weaker. This can make the bones weak and cause them to break more easily.  Blood pressure screening. Blood pressure changes and medicines to control blood pressure can make you feel dizzy.  Strength and balance checks. Your health  care provider may recommend certain tests to check your strength and balance while standing, walking, or changing positions.  Foot health exam. Foot pain and numbness, as well as not wearing proper footwear, can make you more likely to have a fall.  Depression screening. You may be more likely to have a fall if you have a fear of falling, feel emotionally low, or feel unable to do activities that you used to do.  Alcohol use screening. Using too much alcohol can affect your balance and may make you more likely to have a fall. What actions can I take to lower my risk of falls? General instructions  Talk with your health care provider about your risks for falling. Tell your health care provider if: ? You fall. Be sure to tell your health care provider about all falls, even ones that seem minor. ? You feel dizzy, sleepy, or off-balance.  Take over-the-counter and prescription medicines only as told by your health care provider. These include any supplements.  Eat a healthy diet and maintain a healthy weight. A healthy diet includes low-fat dairy products, low-fat (lean) meats, and fiber from whole grains, beans, and lots of fruits and vegetables. Home safety  Remove any tripping hazards, such as rugs, cords, and clutter.  Install safety equipment such as grab bars in bathrooms and safety rails on stairs.  Keep rooms and walkways well-lit. Activity  Follow a regular exercise program to stay fit. This will help you maintain your balance. Ask your health care provider what types of exercise are appropriate for you.  If you need a cane or walker, use it as recommended by your health care provider.  Wear supportive shoes that have nonskid soles.   Lifestyle  Do not drink alcohol if your health care provider tells you not to drink.  If you drink alcohol, limit how much you have: ? 0-1 drink a day for women. ? 0-2 drinks a day for men.  Be aware of how much alcohol is in your drink. In  the [place] one drink equals one typical bottle of beer (12 oz), one-half glass of wine (5 oz), or one shot of hard liquor (1 oz).  Do not use any products that contain nicotine or tobacco, such as cigarettes and e-cigarettes. If you need help quitting, ask your health care provider. Summary  Having a healthy lifestyle and getting preventive care can help to protect your health and wellness after age 40.  Screening and testing are the best way to find a health problem early and help you avoid having a fall. Early diagnosis and treatment give you the best chance for managing medical conditions that are more common for people who are older than age 16.  Falls are a major cause of broken bones and head injuries in people who are older than age 7. Take precautions to prevent a fall at home.  Work with your health care provider to learn what changes you can make to improve your health and wellness and to prevent falls. This information is not intended to replace advice given to you by your health care provider. Make sure you discuss any questions you have with your health care provider. Document Revised: 02/18/2019 Document Reviewed: 09/10/2017 Elsevier Patient Education  2021 [employer].   If you have lab work done today you will be contacted with your lab results within the next 2 weeks.  If you have not heard from [place] then please contact us. The fastest way to get your results is to register for My Chart.   IF you received an x-ray today, you will receive an invoice from [facility]. Please contact [facility] at [phone] with questions or concerns regarding your invoice.   IF you received labwork today, you will receive an invoice from [facility]. Please contact [facility] at [phone] with questions or concerns regarding your invoice.   Our billing staff will not be able to assist you with questions regarding bills from these companies.  You will be contacted with the  lab results as soon as they are available. The fastest way to get your results is to activate your My Chart account. Instructions are located on the last page of this paperwork. If you have not heard from [place] regarding the results in 2 weeks, please contact this office.

## 2020-12-22 LAB — CMP14+EGFR
ALT: 85 IU/L — ABNORMAL HIGH (ref 0–32)
AST: 90 IU/L — ABNORMAL HIGH (ref 0–40)
Albumin/Globulin Ratio: 1.8 (ref 1.2–2.2)
Albumin: 4.8 g/dL — ABNORMAL HIGH (ref 3.7–4.7)
Alkaline Phosphatase: 137 IU/L — ABNORMAL HIGH (ref 44–121)
BUN/Creatinine Ratio: 20 (ref 12–28)
BUN: 16 mg/dL (ref 8–27)
Bilirubin Total: 1 mg/dL (ref 0.0–1.2)
CO2: 19 mmol/L — ABNORMAL LOW (ref 20–29)
Calcium: 10.1 mg/dL (ref 8.7–10.3)
Chloride: 103 mmol/L (ref 96–106)
Creatinine, Ser: 0.81 mg/dL (ref 0.57–1.00)
GFR calc Af Amer: 80 mL/min/{1.73_m2} (ref >59)
GFR calc non Af Amer: 70 mL/min/{1.73_m2} (ref >59)
Globulin, Total: 2.7 g/dL (ref 1.5–4.5)
Glucose: 113 mg/dL — ABNORMAL HIGH (ref 65–99)
Potassium: 4 mmol/L (ref 3.5–5.2)
Sodium: 140 mmol/L (ref 134–144)
Total Protein: 7.5 g/dL (ref 6.0–8.5)

## 2020-12-22 LAB — LIPID PANEL
Chol/HDL Ratio: 3.4 ratio (ref 0.0–4.4)
Cholesterol, Total: 225 mg/dL — ABNORMAL HIGH (ref 100–199)
HDL: 67 mg/dL (ref >39)
LDL Chol Calc (NIH): 136 mg/dL — ABNORMAL HIGH (ref 0–99)
Triglycerides: 124 mg/dL (ref 0–149)
VLDL Cholesterol Cal: 22 mg/dL (ref 5–40)

## 2020-12-22 LAB — TSH: TSH: 0.103 u[IU]/mL — ABNORMAL LOW (ref 0.450–4.500)

## 2020-12-24 ENCOUNTER — Other Ambulatory Visit: Payer: Self-pay | Admitting: Family Medicine

## 2020-12-24 DIAGNOSIS — E039 Hypothyroidism, unspecified: Secondary | ICD-10-CM

## 2020-12-24 DIAGNOSIS — E782 Mixed hyperlipidemia: Secondary | ICD-10-CM

## 2020-12-24 MED ORDER — LEVOTHYROXINE SODIUM 100 MCG PO TABS: 100.0000 ug | ORAL_TABLET | Freq: Every day | ORAL | 0 refills | Status: DC

## 2020-12-24 MED ORDER — ROSUVASTATIN CALCIUM 10 MG PO TABS: 10.0000 mg | ORAL_TABLET | Freq: Every day | ORAL | 3 refills | Status: DC

## 2020-12-26 ENCOUNTER — Telehealth: Payer: Self-pay | Admitting: *Deleted

## 2020-12-26 ENCOUNTER — Other Ambulatory Visit: Payer: Self-pay | Admitting: Family Medicine

## 2020-12-26 NOTE — Telephone Encounter (Signed)
Patient is going to try diet and exercise and will discuss the medication at next visit

## 2021-01-15 ENCOUNTER — Telehealth: Payer: Self-pay | Admitting: Family Medicine

## 2021-01-15 NOTE — Telephone Encounter (Signed)
01/15/2021 - PATIENT STATES SHE SAW [facility] JUST FOR A FEW THINGS ON 12/21/20. ONE OF THEM WAS FOR THE RINGING IN HER EARS. IT HAS NOT GOTTEN ANY BETTER AND HER (L) EAR IS COMPLETELY STOPPED UP. SHE CAN HEAR OUT OF THEM THOUGH. SHE THINKS SHE NEEDS TO BE REFERRED TO A SPECIALIST NOW? BEST PHONE [phone] (CELL) [place]

## 2021-01-15 NOTE — Telephone Encounter (Signed)
Please advise next steps, she says may need a referral for ringing and feeling of being stopped up or do you advise appt here first.

## 2021-01-15 NOTE — Telephone Encounter (Signed)
She has seen ENT and neurology in the past, she should be able to contact either for follow ups.

## 2021-01-16 ENCOUNTER — Other Ambulatory Visit: Payer: Self-pay | Admitting: Family Medicine

## 2021-01-16 DIAGNOSIS — R42 Dizziness and giddiness: Secondary | ICD-10-CM

## 2021-01-16 DIAGNOSIS — H9313 Tinnitus, bilateral: Secondary | ICD-10-CM

## 2021-02-06 ENCOUNTER — Ambulatory Visit: Payer: Medicare Other | Admitting: Podiatry

## 2021-03-06 ENCOUNTER — Encounter: Payer: Self-pay | Admitting: Podiatry

## 2021-03-06 ENCOUNTER — Ambulatory Visit (INDEPENDENT_AMBULATORY_CARE_PROVIDER_SITE_OTHER): Payer: Medicare Other | Admitting: Podiatry

## 2021-03-06 ENCOUNTER — Other Ambulatory Visit: Payer: Self-pay

## 2021-03-06 DIAGNOSIS — M7671 Peroneal tendinitis, right leg: Secondary | ICD-10-CM | POA: Diagnosis not present

## 2021-03-06 DIAGNOSIS — M778 Other enthesopathies, not elsewhere classified: Secondary | ICD-10-CM

## 2021-03-06 MED ORDER — TRIAMCINOLONE ACETONIDE 40 MG/ML IJ SUSP
40.0000 mg | Freq: Once | INTRAMUSCULAR | Status: AC
  Administered 2021-03-06: 40 mg

## 2021-03-06 NOTE — Progress Notes (Signed)
She presents today complaining of pain to the first metatarsophalangeal joint of the left foot.  As well as the peroneal tendons of the right ankle.  States that is been bothering her for quite some time.  States that her daughter was just diagnosed with breast cancer and stress is overwhelming.  Objective: Vital signs are stable alert oriented x3.  Pulses are palpable.  She has limitation crepitation on range of motion of the first metatarsophalangeal joint left foot.  She has fluctuance on palpation of the peroneal tendons right.  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  Plan: Injected 10 mg of Kenalog to both sites today.  Tolerated procedure well.  Remember to ask her how her daughters breast cancer is doing.  Also remember to ask if she found herself a new primary care doctor.

## 2021-03-20 ENCOUNTER — Ambulatory Visit: Payer: Medicare Other | Admitting: Family Medicine

## 2021-03-24 IMAGING — MR MR HEAD WO/W CM
13 series · 48 of 48 positions shown · IV contrast (multihance)
Comparison: 02/26/2013

CLINICAL DATA: Memory loss. Dizziness. Confusion. Weakness. Right
ear tinnitus.

EXAM:
MRI HEAD WITHOUT AND WITH CONTRAST
TECHNIQUE: Multiplanar, multiecho pulse sequences of the brain and surrounding
structures were obtained without and with intravenous contrast.
CONTRAST:  15mL MULTIHANCE GADOBENATE DIMEGLUMINE 529 MG/ML IV SOLN

[Series 5: T1 · sagittal · 4.0mm · 0.75mm/px · 1 of 30 slices shown (1 of 3)]
[im 1/30]
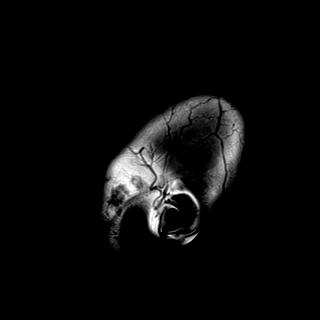

[Series 6: DWI · axial · 3.0mm · 1.44mm/px · z∈[-66,+70]mm · 4 of 86 slices shown (1 of 4)]
[im 1/86]
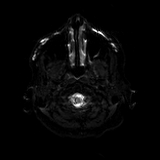
[im 29/86]
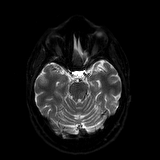
[im 57/86]
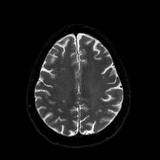
[im 86/86]
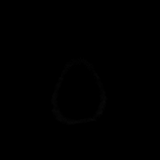

[Series 7: DWI · axial · 3.0mm · 1.44mm/px · z∈[-66,+70]mm · 3 of 43 slices shown (2 of 4)]
[im 1/43]
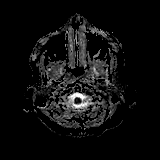
[im 22/43]
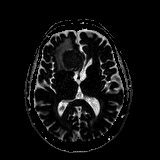
[im 43/43]
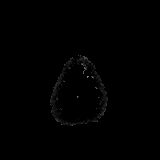

[Series 8: DWI · coronal · 5.0mm · 1.44mm/px · 4 of 64 slices shown (3 of 4)]
[im 1/64]
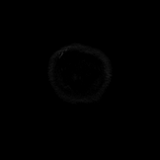
[im 22/64]
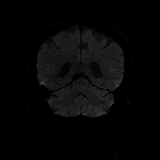
[im 43/64]
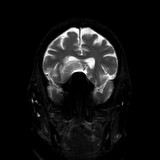
[im 64/64]
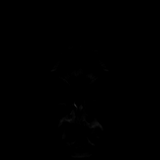

[Series 9: DWI · coronal · 5.0mm · 1.44mm/px · 2 of 32 slices shown (4 of 4)]
[im 1/32]
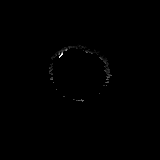
[im 32/32]
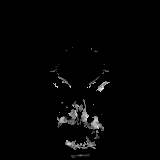

[Series 10: T2 · axial · 4.0mm · 0.36mm/px · z∈[-68,+70]mm · 2 of 28 slices shown]
[im 1/28]
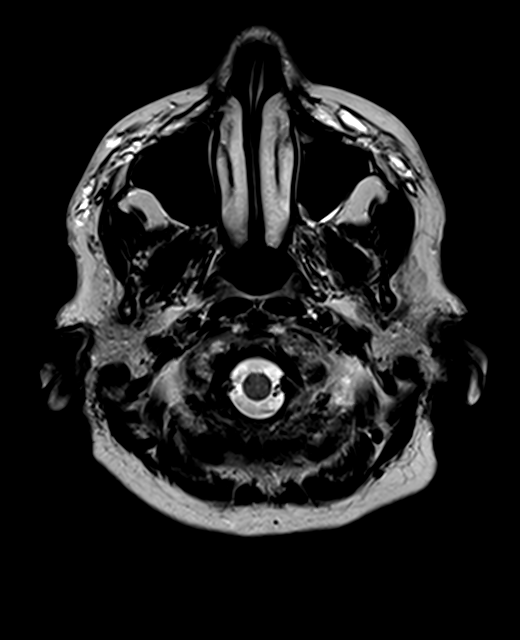
[im 28/28]
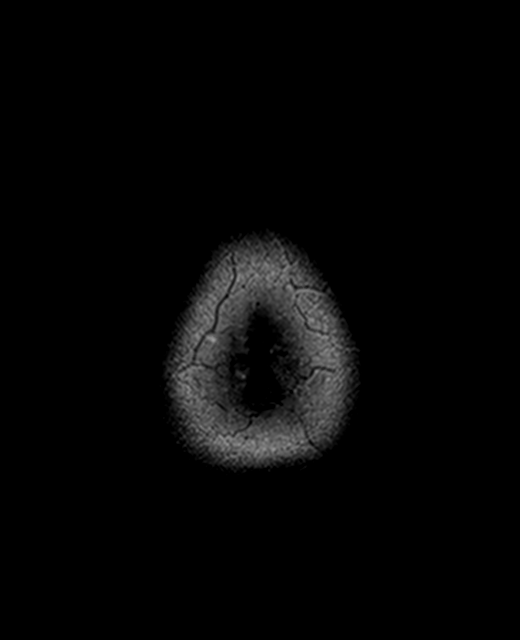

[Series 11: FLAIR · axial · 3.0mm · 0.72mm/px · z∈[-74,+74]mm · 2 of 26 slices shown]
[im 1/26]
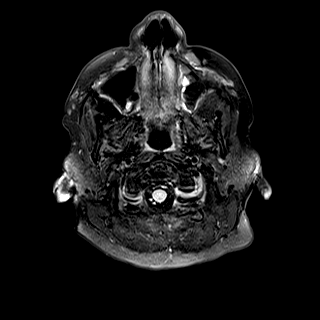
[im 26/26]
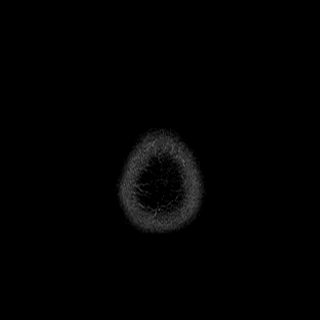

[Series 13: swi_images · axial · 1.5mm · 0.90mm/px · z∈[-69,+71]mm · 6 of 96 slices shown]
[im 1/96]
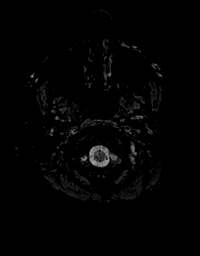
[im 20/96]
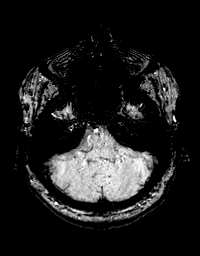
[im 39/96]
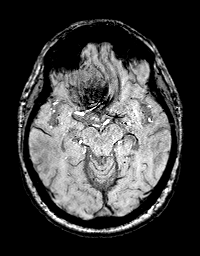
[im 58/96]
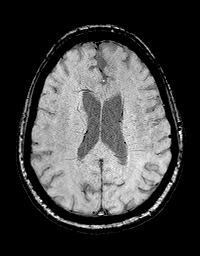
[im 77/96]
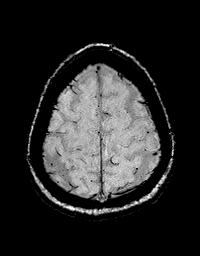
[im 96/96]
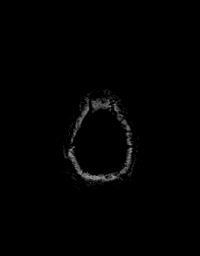

[Series 14: T1 · axial · 1.0mm · 0.94mm/px · z∈[-87,+71]mm · 9 of 160 slices shown (2 of 3)]
[im 1/160]
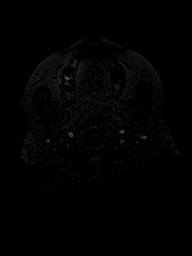
[im 20/160]
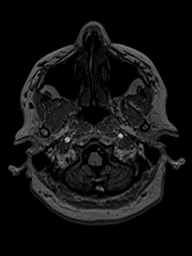
[im 40/160]
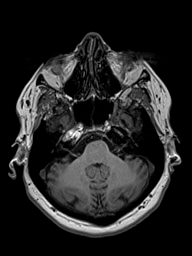
[im 60/160]
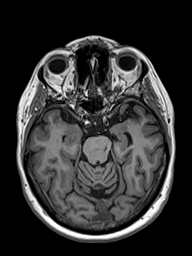
[im 80/160]
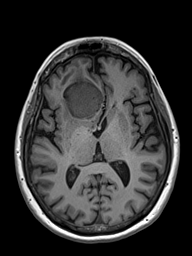
[im 100/160]
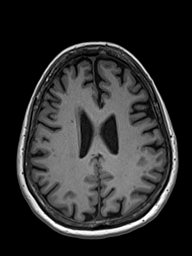
[im 120/160]
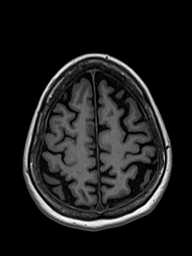
[im 140/160]
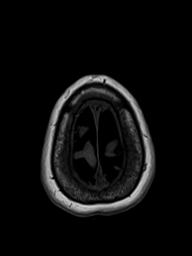
[im 160/160]
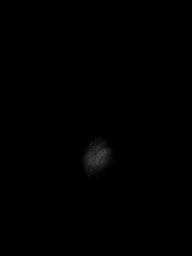

[Series 15: T2 post-contrast · coronal · 4.0mm · 0.36mm/px · 2 of 34 slices shown]
[im 1/34]
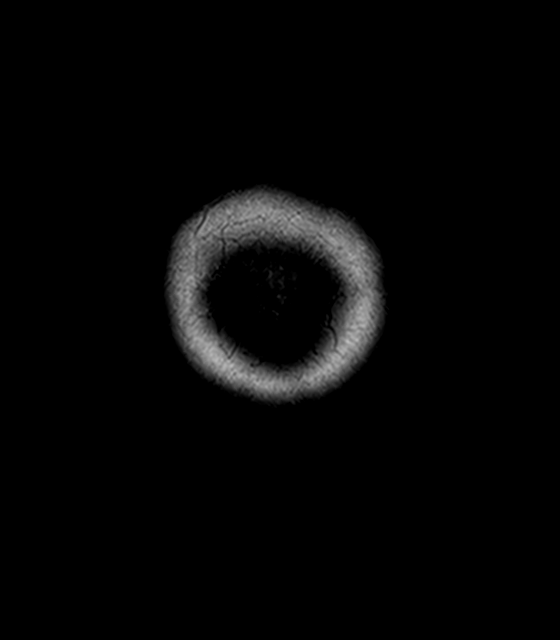
[im 34/34]
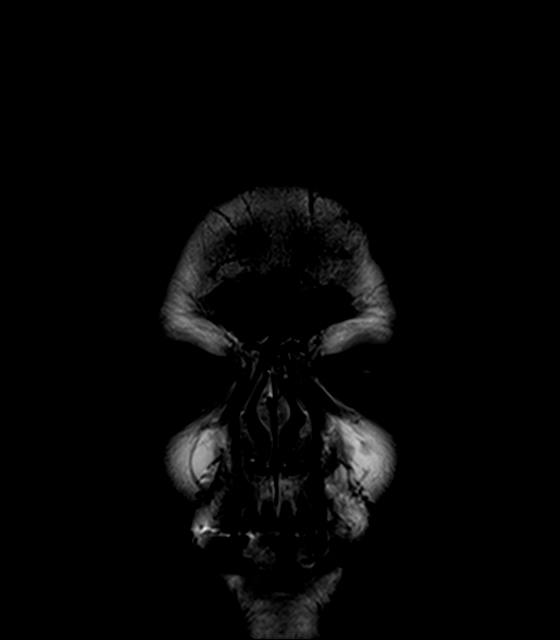

[Series 16: T1 · axial · 1.0mm · 0.94mm/px · z∈[-87,+71]mm · 9 of 160 slices shown (3 of 3)]
[im 1/160]
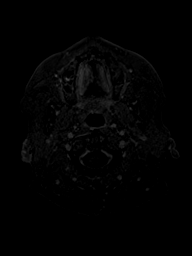
[im 20/160]
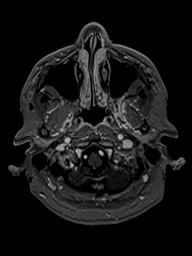
[im 40/160]
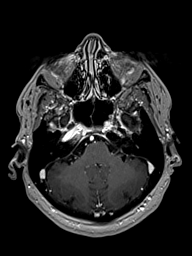
[im 60/160]
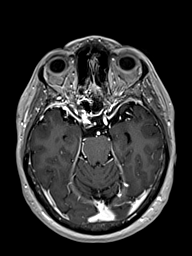
[im 80/160]
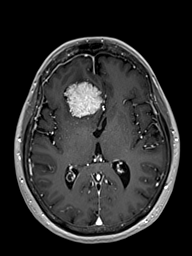
[im 100/160]
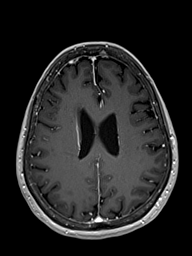
[im 120/160]
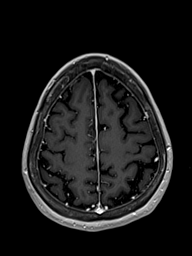
[im 140/160]
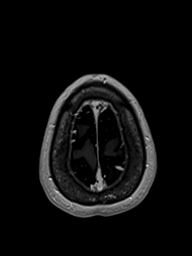
[im 160/160]
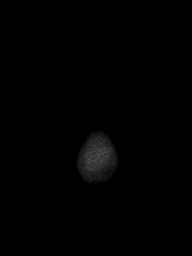

[Series 17: T1 post-contrast · coronal · 4.0mm · 0.72mm/px · 2 of 34 slices shown (1 of 2)]
[im 1/34]
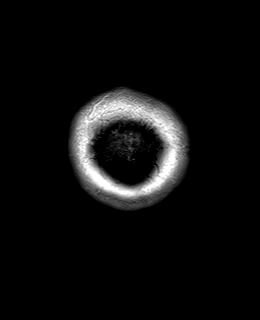
[im 34/34]
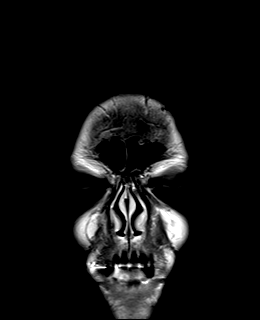

[Series 18: T1 post-contrast · sagittal · 4.0mm · 0.94mm/px · 2 of 30 slices shown (2 of 2)]
[im 1/30]
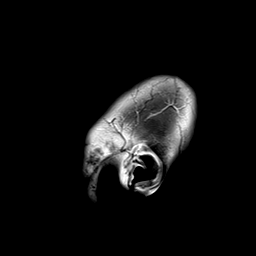
[im 30/30]
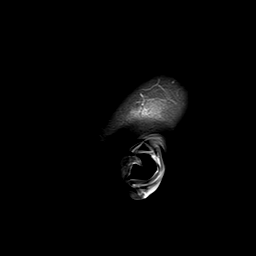

[48 of 48 positions shown; findings below may reference images not displayed]

FINDINGS: Brain: Diffusion imaging does not show any acute or subacute
infarction. Brainstem and cerebellum are normal. Planum sphenoidale
meningioma to the right of midline as seen previously, measuring
x 3.0 x 3.2 cm, not significantly changed since the previous study.
Mass-effect upon the inferior aspect of the right frontal lobe with
mild adjacent vasogenic edema, similar to the study of 1263. Mild
chronic small-vessel ischemic change seen elsewhere within the
cerebral hemispheric white matter, minimally progressive since 1263.
No large vessel territory infarction. No hemorrhage, hydrocephalus
or extra-axial collection.

Vascular: Major vessels at the base of the brain show flow.

Skull and upper cervical spine: Negative

Sinuses/Orbits: Clear/normal

Other: None
IMPRESSION: No acute or reversible finding. Chronic right planum sphenoidale
meningioma measuring 3.7 x 3.0 x 3.2 cm, not significantly changed
since 1263. Mass-effect upon the under surface of the right frontal
lobe with mild adjacent vasogenic edema, similar to the previous
study.

Mild chronic small-vessel ischemic change of the cerebral
hemispheric white matter, slightly progressive since [DATE].

## 2021-06-04 ENCOUNTER — Encounter: Payer: Self-pay | Admitting: Gastroenterology

## 2021-06-05 ENCOUNTER — Ambulatory Visit (INDEPENDENT_AMBULATORY_CARE_PROVIDER_SITE_OTHER): Payer: Medicare Other | Admitting: Podiatry

## 2021-06-05 ENCOUNTER — Other Ambulatory Visit: Payer: Self-pay

## 2021-06-05 ENCOUNTER — Encounter: Payer: Self-pay | Admitting: Podiatry

## 2021-06-05 DIAGNOSIS — M778 Other enthesopathies, not elsewhere classified: Secondary | ICD-10-CM | POA: Diagnosis not present

## 2021-06-05 MED ORDER — TRIAMCINOLONE ACETONIDE 40 MG/ML IJ SUSP
20.0000 mg | Freq: Once | INTRAMUSCULAR | Status: AC
  Administered 2021-06-05: 20 mg

## 2021-06-05 NOTE — Progress Notes (Signed)
She presents today chief complaint of a painful first metatarsophalangeal joint of the left foot.  States that she had a bout with her left heel and her right ankle but those have resolved  She would like an injection in the first metatarsophalangeal joint left possible.  Objective: Vital signs are stable alert oriented x3 pain on palpation range of motion of the first metatarsophalangeal joint left.  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  Capsulitis left.  Plan: I injected dexamethasone local anesthetic on today she tolerated procedure well follow-up with her in 3 months.

## 2021-06-07 ENCOUNTER — Encounter: Payer: Self-pay | Admitting: Orthopedic Surgery

## 2021-06-07 ENCOUNTER — Other Ambulatory Visit: Payer: Self-pay

## 2021-06-07 ENCOUNTER — Ambulatory Visit (INDEPENDENT_AMBULATORY_CARE_PROVIDER_SITE_OTHER): Payer: Medicare Other | Admitting: Orthopedic Surgery

## 2021-06-07 VITALS — BP 152/84 | HR 86 | Temp 96.9°F | Ht 66.0 in | Wt 162.0 lb

## 2021-06-07 DIAGNOSIS — R42 Dizziness and giddiness: Secondary | ICD-10-CM

## 2021-06-07 DIAGNOSIS — Z780 Asymptomatic menopausal state: Secondary | ICD-10-CM

## 2021-06-07 DIAGNOSIS — R413 Other amnesia: Secondary | ICD-10-CM

## 2021-06-07 DIAGNOSIS — H9311 Tinnitus, right ear: Secondary | ICD-10-CM

## 2021-06-07 DIAGNOSIS — E039 Hypothyroidism, unspecified: Secondary | ICD-10-CM | POA: Diagnosis not present

## 2021-06-07 DIAGNOSIS — Z853 Personal history of malignant neoplasm of breast: Secondary | ICD-10-CM | POA: Diagnosis not present

## 2021-06-07 DIAGNOSIS — D329 Benign neoplasm of meninges, unspecified: Secondary | ICD-10-CM

## 2021-06-07 DIAGNOSIS — G8929 Other chronic pain: Secondary | ICD-10-CM | POA: Insufficient documentation

## 2021-06-07 DIAGNOSIS — M542 Cervicalgia: Secondary | ICD-10-CM

## 2021-06-07 DIAGNOSIS — M778 Other enthesopathies, not elsewhere classified: Secondary | ICD-10-CM

## 2021-06-07 MED ORDER — LEVOTHYROXINE SODIUM 100 MCG PO TABS: 100.0000 ug | ORAL_TABLET | Freq: Every day | ORAL | 0 refills | Status: DC

## 2021-06-07 NOTE — Patient Instructions (Signed)
Please schedule bone density with upcoming mammogram

## 2021-06-07 NOTE — Progress Notes (Signed)
Careteam: Patient Care Team: Yvonna Alanis, NP as PCP - General (Adult Health Nurse Practitioner) Cameron Sprang, MD as Consulting Physician (Neurology)  Seen by: Windell Moulding, AGNP-C  PLACE OF SERVICE:  [place] Directive information Does Patient Have a Medical Advance Directive?: Yes, Type of Advance Directive: Living will, Does patient want to make changes to medical advance directive?: No - Patient declined  Allergies  Allergen Reactions   Epinephrine     REACTION: rapid heart rate   Procaine Hcl     REACTION: rapid heart rate, heart skips beat    Chief Complaint  Patient presents with   [place] patient establish care. Discuss Thyroid NP versus levothyroxine, previously on Thyroid NP 60 mg once daily. Removed Crestor from medication list due to patient stating " I told the nurse I would not take a statin." Patient has never taken a statin, yet read about the potential side effects. Patient c/o ringing in ears was suppose to be referred by last PCP to ENT, referral never completed. Patient c/o should pain, right is worse.      HPI: Patient is a 80 y.o. female seen today to establish at [facility].   Previous Provider was Dr. Pamella Pert. Office closed in March, 2022.   Originally from [place]. Moved to [place] due to husbands work. She has one daughter. 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 Lives in 2 story house. Pets: 6 birds, and one dog. Husband has prostate cancer and is under active treatment at this time.   Hypothyroid- thinks she was diagnosed in 2018. She takes her levothyroxine daily on empty stomach. Denies fatigue and constipation, gained 5 lbs in past few months.   Dizziness- occurring about once a week. Describes " room going up and down." Will take meclizine and prednisone if episode lasts > 30 minutes.   Tinnitus- she has had for years. Randomly occurs. Right ear is worse. ENT referral made made PCP visit, she was  never contacted.   Neck pain/tightness- she has seen PT in past. She has also had needling and massage done. Does not take pain medication at this time. Denies numbness/tingling down extremities.   Back pain- intermittent, no radiation. She does not take anything for the pain. She will use husbands topical voltaren gel.   Meningoma- seen by Dr. Redmond Pulling neurosurgery and Dr. Delice Lesch Neurology. She neurocognitive testing recommended but she never had it done. Unsure if MMSE ever done.   Memory issues- trouble remembering directions to places. Does not remember when she last had MMSE done. Some trouble with words at times.   Stress:  Reports some frustration and increased stress with husbands health. He is currently receiving treatment for prostate cancer. She is upset he continues to drink during this process. Reports he drinks light beer all day long and then will have a small glass of wine in evening. She tells him how upsetting his drinking is to her. Sometimes they argue about this topic.   Breast cancer- diagnosed in 2012, right sided. She had surgery. No tamoxifen. Sister also had breast cancer. Does breast exams monthly. Mammogram scheduled next month.   Exercise: like to go to the gym, has a treadmill at home as well. She has not gone to gym since covid began.   Diet: she has gained 5 lbs in the past few months, will eat 2 meals- brunch and supper. Does not drink soda, avoids carbs and  sugars. Does not snack. Drinks martini daily.   No recent falls, injuries or hospitalizations.   Does not smoke. Does not use illicit drugs.   Surgeries: hysterectomy- she has ovaries        Right foot surgery        Suspected to have surgery on left foot 11/2021  Colonscopy- 2012 Mammogram done in 2020- Scheduled 06/2021 PAP- would like referral to another gynecologist Dexa - last done 2015, she admits to taking calcium and vitamin d supplements daily  Vaccines: Tetanus vaccine with Dr. Stephanie Coup in  2008. Interested in getting one next visit.  Shingles- education given Pneumonia- has not had them, education given Flu- does not receive, education given  Visits [facility] twice a year. Denies changes to vision.   Reports dental cleaning earlier this year. Denies dental pain.   She does have a living will. Copy requested. She is in the process of upgrading wills.   Review of Systems:  Review of Systems  Constitutional:  Negative for chills, fever, malaise/fatigue and weight loss.  HENT:  Positive for tinnitus. Negative for ear discharge and ear pain.   Eyes:  Negative for blurred vision and double vision.  Respiratory:  Negative for cough, shortness of breath and wheezing.   Cardiovascular:  Negative for chest pain and leg swelling.  Gastrointestinal:  Negative for abdominal pain, blood in stool, constipation, diarrhea, heartburn, nausea and vomiting.  Genitourinary:  Negative for dysuria and hematuria.  Musculoskeletal:  Positive for back pain and neck pain. Negative for falls.  Skin: Negative.   Neurological:  Positive for dizziness. Negative for tremors, weakness and headaches.  Psychiatric/Behavioral:  Positive for memory loss. Negative for depression. The patient is not nervous/anxious and does not have insomnia.    Past Medical History:  Diagnosis Date   Allergy    dilantin =rash   Arthritis    back   Breast cancer ([place]) 58/06/3253   Complication of anesthesia    hard to wake up-goes out fast   Liver hemangioma    been follow on CT scan since 2008   Neck pain    Thyroid disease    Past Surgical History:  Procedure Laterality Date   ABDOMINAL HYSTERECTOMY     BREAST SURGERY  1988   rt br bx-negative   COLONOSCOPY     LYMPH NODE BIOPSY  10/14/2011   Procedure: LYMPH NODE BIOPSY;  Surgeon: Edward Jolly, MD;  Location: [facility];  Service: General;  Laterality: Right;   right foot surgery     toe implant   Social History:    reports that she has never smoked. She has never used smokeless tobacco. She reports current alcohol use of about 5.0 standard drinks of alcohol per week. She reports that she does not use drugs.  Family History  Problem Relation Age of Onset   Cancer Mother 63       pancreatic cancer   Cancer Father 37       colon cancer   Cancer Sister 65       breast cancer   Cancer Sister    Cancer Maternal Grandmother 20       brain cancer   Cancer Paternal Grandmother        Brain   Cancer Paternal Aunt        breast   Breast cancer Daughter    Liver disease Son    Alcoholism Son     Medications: Patient's Medications  New  Prescriptions   No medications on file  Previous Medications   LEVOTHYROXINE (SYNTHROID) 100 MCG TABLET    Take 1 tablet (100 mcg total) by mouth daily.   MECLIZINE (ANTIVERT) 25 MG TABLET    Take 25 mg by mouth 3 (three) times daily as needed for dizziness.   NUTRITIONAL SUPPLEMENTS (WELLNESS ESSENTIALS PO)    Take 1 capsule by mouth daily. Essential 1 with Q-10  Modified Medications   No medications on file  Discontinued Medications   ROSUVASTATIN (CRESTOR) 10 MG TABLET    Take 1 tablet (10 mg total) by mouth daily.    Physical Exam:  Vitals:   06/07/21 1012  BP: (!) 152/84  Pulse: 86  Temp: (!) 96.9 F (36.1 C)  TempSrc: Temporal  SpO2: 99%  Weight: 162 lb (73.5 kg)  Height: _0  (1.676 m)   Body mass index is 26.15 kg/m. Wt Readings from Last 3 Encounters:  06/07/21 162 lb (73.5 kg)  12/21/20 161 lb (73 kg)  07/31/20 162 lb (73.5 kg)    Physical Exam Vitals reviewed.  Constitutional:      General: She is not in acute distress. HENT:     Head: Normocephalic.     Nose: Nose normal.     Mouth/Throat:     Mouth: Mucous membranes are moist.  Eyes:     General:        Right eye: No discharge.        Left eye: No discharge.     Extraocular Movements:     Right eye: No nystagmus.     Left eye: No nystagmus.  Neck:     Thyroid: No thyroid  mass or thyromegaly.  Cardiovascular:     Rate and Rhythm: Normal rate and regular rhythm.     Pulses: Normal pulses.     Heart sounds: Normal heart sounds. No murmur heard. Pulmonary:     Effort: Pulmonary effort is normal. No respiratory distress.     Breath sounds: Normal breath sounds. No wheezing.  Abdominal:     General: Bowel sounds are normal. There is no distension.     Palpations: Abdomen is soft.     Tenderness: There is no abdominal tenderness.  Musculoskeletal:     Cervical back: Normal range of motion.     Right lower leg: No edema.     Left lower leg: No edema.  Lymphadenopathy:     Cervical: No cervical adenopathy.  Skin:    General: Skin is warm and dry.     Capillary Refill: Capillary refill takes less than 2 seconds.  Neurological:     General: No focal deficit present.     Mental Status: She is alert and oriented to person, place, and time.     Motor: No weakness.     Gait: Gait normal.  Psychiatric:        Mood and Affect: Mood normal.        Behavior: Behavior normal.    Labs reviewed: Basic Metabolic Panel: Recent Labs    06/08/20 1635 12/21/20 1637  NA  --  140  K  --  4.0  CL  --  103  CO2  --  19*  GLUCOSE  --  113*  BUN  --  16  CREATININE  --  0.81  CALCIUM  --  10.1  TSH 0.713 0.103*   Liver Function Tests: Recent Labs    12/21/20 1637  AST 90*  ALT 85*  ALKPHOS 137*  BILITOT 1.0  PROT 7.5  ALBUMIN 4.8*   No results for input(s): LIPASE, AMYLASE in the last 8760 hours. No results for input(s): AMMONIA in the last 8760 hours. CBC: No results for input(s): WBC, NEUTROABS, HGB, HCT, MCV, PLT in the last 8760 hours. Lipid Panel: Recent Labs    12/21/20 1637  CHOL 225*  HDL 67  LDLCALC 136*  TRIG 124  CHOLHDL 3.4   TSH: Recent Labs    06/08/20 1635 12/21/20 1637  TSH 0.713 0.103*   A1C: No results found for: HGBA1C   Assessment/Plan 1. Acquired hypothyroidism - TSH 0.103 12/21/20 - levothyroxine (SYNTHROID)  100 MCG tablet; Take 1 tablet (100 mcg total) by mouth daily.  Dispense: 90 tablet; Refill: 0 - TSH future  2. Tinnitus of right ear - ongoing, occurring daily - ENT referral placed Feb 2022, she was never contacted - Ambulatory referral to ENT  3. 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 Postmenopausal - DEXA 2015, T-SCORE 0.7 - cont calcium and vit d supplements, please bring next visit - DG Bone Density; Future  5. Dizziness - ongoing - occurring 1-2 x/week - hx of meningoma - cont meclizine and prednisone prn - cbc/diff- future - cmp- future  6. Memory loss - MRI brain 2021- mild chronic small-vessel ischemic changes of the cerebral hemispheric white matter - neurocognitive testing recommended last year- never done - MMSE- future  7. Chronic neck pain - feel a knot at times - denies numbness and tingling down arms  8. 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 Capsulitis of left foot - followed by Dr. Milinda Pointer - surgery recommended in future    Total time: 52 minutes. Greater than 50% of total time spent doing patient education and health maintenance.    Labs/tests next time: cbc/diff, cmp, tsh, MMSE, tdap vaccine   Next appt: 07/19/2021  Windell Moulding, [facility] [phone]

## 2021-07-17 ENCOUNTER — Other Ambulatory Visit: Payer: Medicare Other

## 2021-07-19 ENCOUNTER — Ambulatory Visit: Payer: Medicare Other | Admitting: Orthopedic Surgery

## 2021-07-24 ENCOUNTER — Ambulatory Visit (INDEPENDENT_AMBULATORY_CARE_PROVIDER_SITE_OTHER): Payer: Medicare Other | Admitting: Otolaryngology

## 2021-07-24 ENCOUNTER — Other Ambulatory Visit: Payer: Self-pay

## 2021-07-24 DIAGNOSIS — H9313 Tinnitus, bilateral: Secondary | ICD-10-CM

## 2021-07-24 DIAGNOSIS — H9113 Presbycusis, bilateral: Secondary | ICD-10-CM | POA: Diagnosis not present

## 2021-07-24 NOTE — Progress Notes (Signed)
HPI: Krista Dunn is a 80 y.o. female who presents is referred by her PCP for evaluation of constant noise that has been in her right ear for several years and is just started in the left ear over the past year.  She has had the tinnitus worse on the right side and that has been there for 7 to 8 years.  She has just noticed it starting in the left ear over the past year. She has not noticed any hearing problems. She occasionally has dizziness that is sometimes treated with steroids and dizzy medication that seems to help but has not had any dizziness recently.  She feels like the tinnitus may contribute to the dizziness. She denies loud noise exposure..  Past Medical History:  Diagnosis Date   Allergy    dilantin =rash   Arthritis    back   Breast cancer ([place]) 0000000   Complication of anesthesia    hard to wake up-goes out fast   Liver hemangioma    been follow on CT scan since 2008   Neck pain    Thyroid disease    Past Surgical History:  Procedure Laterality Date   ABDOMINAL HYSTERECTOMY     BREAST SURGERY  1988   rt br bx-negative   COLONOSCOPY     LYMPH NODE BIOPSY  10/14/2011   Procedure: LYMPH NODE BIOPSY;  Surgeon: Edward Jolly, MD;  Location: [facility];  Service: General;  Laterality: Right;   right foot surgery     toe implant   Social History   Socioeconomic History   Marital status: Married    Spouse name: Not on file   Number of children: 2   Years of education: Not on file   Highest education level: Not on file  Occupational History   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    Employer: RETIRED  Tobacco Use   Smoking status: Never   Smokeless tobacco: Never  Vaping Use   Vaping Use: Never used  Substance and Sexual Activity   Alcohol use: Yes    Alcohol/week: 5.0 standard drinks    Types: 5 drink(s) per week    Comment: "   Drug use: No   Sexual activity: Yes  Other Topics Concern   Not on file  Social History  Narrative   Right handed    Lives with husband    Social Determinants of Health   Financial Resource Strain: Not on file  Food Insecurity: Not on file  Transportation Needs: Not on file  Physical Activity: Not on file  Stress: Not on file  Social Connections: Not on file   Family History  Problem Relation Age of Onset   Cancer Mother 71       pancreatic cancer   Cancer Father 27       colon cancer   Cancer Sister 1       breast cancer   Cancer Sister    Cancer Maternal Grandmother 72       brain cancer   Cancer Paternal Grandmother        Brain   Cancer Paternal Aunt        breast   Breast cancer Daughter    Liver disease Son    Alcoholism Son    Allergies  Allergen Reactions   Epinephrine     REACTION: rapid heart rate   Procaine Hcl     REACTION: rapid heart rate, heart skips beat   Prior to  Admission medications   Medication Sig Start Date End Date Taking? Authorizing Provider  levothyroxine (SYNTHROID) 100 MCG tablet Take 1 tablet (100 mcg total) by mouth daily. 06/07/21   Fargo, Amy E, NP  meclizine (ANTIVERT) 25 MG tablet Take 25 mg by mouth 3 (three) times daily as needed for dizziness.    [provider]  Nutritional Supplements (WELLNESS ESSENTIALS PO) Take 1 capsule by mouth daily. Essential 1 with Q-10    [provider]     Positive ROS: Otherwise negative  All other systems have been reviewed and were otherwise negative with the exception of those mentioned in the HPI and as above.  Physical Exam: Constitutional: Alert, well-appearing, no acute distress Ears: External ears without lesions or tenderness. Ear canals are clear bilaterally with minimal wax buildup and clear TMs bilaterally.  Auscultation of the ears revealed no objective tinnitus. Nasal: External nose without lesions. . Clear nasal passages Oral: Lips and gums without lesions. Tongue and palate mucosa without lesions. Posterior oropharynx clear. Neck: No palpable  adenopathy or masses.  Auscultation of the neck reveals no carotid bruits. Respiratory: Breathing comfortably  Skin: No facial/neck lesions or rash noted.  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  SRT's were 25 DB on the right and 15 DB on the left.  She had type A tympanograms bilaterally.  Pure-tone testing at 4000 frequency left and right ear heard at 40 DB which progressed to 60 DB at 8000 frequency on the right side and 50 DB at 8000 frequency on the left.  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  Left side is slightly worse than the right side.  Reviewed with her concerning limited treatment options for tinnitus.  I discussed with her concerning using masking noise when the tinnitus is bad. also cautioned her about avoiding loud noise exposure or using ear protection when around loud noise. Gave her samples of Lipo flavonoid which is beneficial in some people with tinnitus. She could consider use of hearing aids that may be beneficial.   Radene Journey, MD   CC:

## 2021-07-26 ENCOUNTER — Encounter: Payer: Self-pay | Admitting: Orthopedic Surgery

## 2021-07-26 LAB — HM MAMMOGRAPHY

## 2021-07-27 ENCOUNTER — Encounter: Payer: Self-pay | Admitting: *Deleted

## 2021-08-01 ENCOUNTER — Encounter (INDEPENDENT_AMBULATORY_CARE_PROVIDER_SITE_OTHER): Payer: Self-pay

## 2021-08-13 ENCOUNTER — Other Ambulatory Visit: Payer: Medicare Other

## 2021-08-16 ENCOUNTER — Ambulatory Visit: Payer: Medicare Other | Admitting: Orthopedic Surgery

## 2021-08-21 ENCOUNTER — Other Ambulatory Visit: Payer: Self-pay

## 2021-08-21 ENCOUNTER — Other Ambulatory Visit: Payer: Medicare Other

## 2021-08-21 DIAGNOSIS — E039 Hypothyroidism, unspecified: Secondary | ICD-10-CM

## 2021-08-21 DIAGNOSIS — R42 Dizziness and giddiness: Secondary | ICD-10-CM

## 2021-08-22 LAB — CBC WITH DIFFERENTIAL/PLATELET
Absolute Monocytes: 285 cells/uL (ref 200–950)
Basophils Absolute: 11 cells/uL (ref 0–200)
Basophils Relative: 0.3 %
Eosinophils Absolute: 93 cells/uL (ref 15–500)
Eosinophils Relative: 2.5 %
HCT: 38.8 % (ref 35.0–45.0)
Hemoglobin: 13.1 g/dL (ref 11.7–15.5)
Lymphs Abs: 999 cells/uL (ref 850–3900)
MCH: 30.5 pg (ref 27.0–33.0)
MCHC: 33.8 g/dL (ref 32.0–36.0)
MCV: 90.2 fL (ref 80.0–100.0)
MPV: 12.5 fL (ref 7.5–12.5)
Monocytes Relative: 7.7 %
Neutro Abs: 2313 cells/uL (ref 1500–7800)
Neutrophils Relative %: 62.5 %
Platelets: 190 10*3/uL (ref 140–400)
RBC: 4.3 10*6/uL (ref 3.80–5.10)
RDW: 13.8 % (ref 11.0–15.0)
Total Lymphocyte: 27 %
WBC: 3.7 10*3/uL — ABNORMAL LOW (ref 3.8–10.8)

## 2021-08-22 LAB — COMPREHENSIVE METABOLIC PANEL
AG Ratio: 1.6 (calc) (ref 1.0–2.5)
ALT: 20 U/L (ref 6–29)
AST: 25 U/L (ref 10–35)
Albumin: 4.2 g/dL (ref 3.6–5.1)
Alkaline phosphatase (APISO): 101 U/L (ref 37–153)
BUN: 9 mg/dL (ref 7–25)
CO2: 27 mmol/L (ref 20–32)
Calcium: 9.9 mg/dL (ref 8.6–10.4)
Chloride: 106 mmol/L (ref 98–110)
Creat: 0.65 mg/dL (ref 0.60–1.00)
Globulin: 2.7 g/dL (calc) (ref 1.9–3.7)
Glucose, Bld: 117 mg/dL (ref 65–139)
Potassium: 3.8 mmol/L (ref 3.5–5.3)
Sodium: 143 mmol/L (ref 135–146)
Total Bilirubin: 1.2 mg/dL (ref 0.2–1.2)
Total Protein: 6.9 g/dL (ref 6.1–8.1)

## 2021-08-22 LAB — TSH: TSH: 0.04 mIU/L — ABNORMAL LOW (ref 0.40–4.50)

## 2021-08-23 ENCOUNTER — Encounter: Payer: Self-pay | Admitting: Orthopedic Surgery

## 2021-08-23 ENCOUNTER — Other Ambulatory Visit: Payer: Self-pay

## 2021-08-23 ENCOUNTER — Ambulatory Visit (INDEPENDENT_AMBULATORY_CARE_PROVIDER_SITE_OTHER): Payer: Medicare Other | Admitting: Orthopedic Surgery

## 2021-08-23 VITALS — BP 128/70 | HR 72 | Temp 97.3°F | Ht 66.0 in | Wt 154.8 lb

## 2021-08-23 DIAGNOSIS — H9311 Tinnitus, right ear: Secondary | ICD-10-CM

## 2021-08-23 DIAGNOSIS — R413 Other amnesia: Secondary | ICD-10-CM

## 2021-08-23 DIAGNOSIS — Z636 Dependent relative needing care at home: Secondary | ICD-10-CM

## 2021-08-23 DIAGNOSIS — Z853 Personal history of malignant neoplasm of breast: Secondary | ICD-10-CM | POA: Diagnosis not present

## 2021-08-23 DIAGNOSIS — E039 Hypothyroidism, unspecified: Secondary | ICD-10-CM

## 2021-08-23 DIAGNOSIS — M810 Age-related osteoporosis without current pathological fracture: Secondary | ICD-10-CM

## 2021-08-23 DIAGNOSIS — M778 Other enthesopathies, not elsewhere classified: Secondary | ICD-10-CM

## 2021-08-23 MED ORDER — LEVOTHYROXINE SODIUM 88 MCG PO TABS: 88.0000 ug | ORAL_TABLET | Freq: Every day | ORAL | 3 refills | Status: DC

## 2021-08-23 NOTE — Patient Instructions (Signed)
Will reduce levothyroxine to 88 mcg- take every morning before foods.
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
## 2021-08-30 ENCOUNTER — Ambulatory Visit: Payer: Medicare Other | Admitting: Orthopedic Surgery

## 2021-09-03 ENCOUNTER — Other Ambulatory Visit: Payer: Self-pay | Admitting: Orthopedic Surgery

## 2021-09-03 DIAGNOSIS — E039 Hypothyroidism, unspecified: Secondary | ICD-10-CM

## 2021-09-03 NOTE — Telephone Encounter (Signed)
Called patient and no answer. Voicemail was left with office call back number.   

## 2021-09-03 NOTE — Telephone Encounter (Signed)
Patient has request refill on medication "Levothyroxine 110mcg". Patient also has "Levothyroxine 54mcg". Is patient suppose to be taking both of these. Medication pend and sent to PCP Yvonna Alanis, NP for approval. Please Advise.

## 2021-09-04 ENCOUNTER — Ambulatory Visit: Payer: Medicare Other | Admitting: Podiatry

## 2021-09-04 NOTE — Telephone Encounter (Signed)
Patient called office back and states that she recently got some calcium, magnesium, vitamin D3 pills and she's drinking some milk. Patient states that she would like to try this first. Patient is not interested in getting injections. Message routed back to PCP Yvonna Alanis, NP.

## 2021-09-06 ENCOUNTER — Ambulatory Visit: Payer: Medicare Other | Admitting: Podiatry

## 2021-09-18 ENCOUNTER — Other Ambulatory Visit: Payer: Self-pay

## 2021-09-18 ENCOUNTER — Ambulatory Visit (INDEPENDENT_AMBULATORY_CARE_PROVIDER_SITE_OTHER): Payer: Medicare Other | Admitting: Podiatry

## 2021-09-18 DIAGNOSIS — M778 Other enthesopathies, not elsewhere classified: Secondary | ICD-10-CM | POA: Diagnosis not present

## 2021-09-18 MED ORDER — TRIAMCINOLONE ACETONIDE 40 MG/ML IJ SUSP
20.0000 mg | Freq: Once | INTRAMUSCULAR | Status: AC
  Administered 2021-09-18: 20 mg

## 2021-09-18 NOTE — Progress Notes (Signed)
She presents today states that her left foot has improved considerably but not completely gone she has been hiking several times and feels a lot better with it.  States that she feels that she still needs to have surgery and would still like an injection today due to the upcoming holidays.  Objective: Vital signs are stable she is alert and oriented x3 she has pain on palpation to and extracted first metatarsophalangeal joint.  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  Plan: I injected the area today with 2 mg dexamethasone after sterile Betadine skin prep.  Follow-up with her in January to discuss surgical intervention regarding a Keller arthroplasty with single silicone implant left.  Another set of x-rays will be needed to be taken

## 2021-10-08 ENCOUNTER — Other Ambulatory Visit: Payer: Medicare Other

## 2021-10-08 DIAGNOSIS — E039 Hypothyroidism, unspecified: Secondary | ICD-10-CM

## 2021-10-10 ENCOUNTER — Other Ambulatory Visit: Payer: Medicare Other

## 2021-10-10 ENCOUNTER — Other Ambulatory Visit: Payer: Self-pay

## 2021-10-10 LAB — TSH: TSH: 0.43 mIU/L (ref 0.40–4.50)

## 2021-11-27 ENCOUNTER — Ambulatory Visit: Payer: Medicare Other | Admitting: Podiatry

## 2022-01-08 ENCOUNTER — Encounter: Payer: Self-pay | Admitting: Podiatry

## 2022-01-08 ENCOUNTER — Ambulatory Visit (INDEPENDENT_AMBULATORY_CARE_PROVIDER_SITE_OTHER): Payer: Medicare Other | Admitting: Podiatry

## 2022-01-08 ENCOUNTER — Other Ambulatory Visit: Payer: Self-pay

## 2022-01-08 DIAGNOSIS — M7751 Other enthesopathy of right foot: Secondary | ICD-10-CM | POA: Diagnosis not present

## 2022-01-08 DIAGNOSIS — M778 Other enthesopathies, not elsewhere classified: Secondary | ICD-10-CM | POA: Diagnosis not present

## 2022-01-08 MED ORDER — TRIAMCINOLONE ACETONIDE 40 MG/ML IJ SUSP
20.0000 mg | Freq: Once | INTRAMUSCULAR | Status: AC
  Administered 2022-01-08: 20 mg

## 2022-01-08 MED ORDER — DEXAMETHASONE SODIUM PHOSPHATE 120 MG/30ML IJ SOLN
2.0000 mg | Freq: Once | INTRAMUSCULAR | Status: AC
  Administered 2022-01-08: 2 mg via INTRA_ARTICULAR

## 2022-01-09 NOTE — Progress Notes (Signed)
She presents today for follow-up of pain to her sinus tarsi of her right foot and the first metatarsophalangeal joint of her left foot states that she is really still not sure about surgery every time she wants to planet something comes up.  Objective: Vital signs are stable alert and oriented x3.  Pulses are palpable.  She has osteoarthritis and capsulitis of the first metatarsal phalangeal joint of the left foot and pain on end range of motion of the subtalar joint of the right foot.  She also has tenderness on palpation of the sinus tarsi.  Assessment: Pain in limb secondary to osteoarthritis first metatarsophalangeal joint left foot subtalar joint capsulitis right foot.  Plan: Injected into the joint after sterile Betadine skin prep dexamethasone local anesthetic 2 mg was injected and then the sinus tarsi subtalar joint of the right foot was injected with 10 mg Kenalog 5 mg Marcaine point maximal tenderness I am going to follow-up with her in 3 months.

## 2022-02-21 ENCOUNTER — Ambulatory Visit: Payer: Medicare Other | Admitting: Orthopedic Surgery

## 2022-03-28 ENCOUNTER — Ambulatory Visit: Payer: Medicare Other | Admitting: Orthopedic Surgery

## 2022-04-16 ENCOUNTER — Ambulatory Visit: Payer: Medicare Other | Admitting: Podiatry

## 2022-04-17 ENCOUNTER — Encounter: Payer: Self-pay | Admitting: Orthopedic Surgery

## 2022-04-18 ENCOUNTER — Ambulatory Visit (INDEPENDENT_AMBULATORY_CARE_PROVIDER_SITE_OTHER): Payer: Medicare Other | Admitting: Orthopedic Surgery

## 2022-04-18 ENCOUNTER — Encounter: Payer: Self-pay | Admitting: Orthopedic Surgery

## 2022-04-18 VITALS — BP 122/74 | HR 74 | Temp 97.8°F | Ht 66.0 in | Wt 177.2 lb

## 2022-04-18 DIAGNOSIS — M810 Age-related osteoporosis without current pathological fracture: Secondary | ICD-10-CM

## 2022-04-18 DIAGNOSIS — E039 Hypothyroidism, unspecified: Secondary | ICD-10-CM | POA: Diagnosis not present

## 2022-04-18 DIAGNOSIS — Z636 Dependent relative needing care at home: Secondary | ICD-10-CM

## 2022-04-18 DIAGNOSIS — Z853 Personal history of malignant neoplasm of breast: Secondary | ICD-10-CM

## 2022-04-18 DIAGNOSIS — F329 Major depressive disorder, single episode, unspecified: Secondary | ICD-10-CM

## 2022-04-18 DIAGNOSIS — M778 Other enthesopathies, not elsewhere classified: Secondary | ICD-10-CM | POA: Diagnosis not present

## 2022-04-18 DIAGNOSIS — D329 Benign neoplasm of meninges, unspecified: Secondary | ICD-10-CM

## 2022-04-18 DIAGNOSIS — R635 Abnormal weight gain: Secondary | ICD-10-CM

## 2022-04-18 NOTE — Progress Notes (Signed)
Careteam: Patient Care Team: Octavia Heir, NP as PCP - General (Adult Health Nurse Practitioner) Van Clines, MD as Consulting Physician (Neurology)  Seen by: Hazle Nordmann, AGNP-C  PLACE OF SERVICE:  [facility]  Advanced Directive information Does Patient Have a Medical Advance Directive?: Yes, Type of Advance Directive: Living will, Does patient want to make changes to medical advance directive?: No - Patient declined  Allergies  Allergen Reactions   Epinephrine     REACTION: rapid heart rate   Procaine Hcl     REACTION: rapid heart rate, heart skips beat    Chief Complaint  Patient presents with   Medical Management of Chronic Issues    Patient presents today for a 6 month follow up. Patient wonders if her thyroid medication is causing soreness and pain.    Quality Metric Gaps    Discuss the need for TDAP, Shingrix, Pneumonia vaccine, and additional Covid booster, or post pone if patient refuses.      HPI: Patient is a 81 y.o. female seen today for medical management of chronic conditions.   Caregiver strain has improved. Husband recovering from prostate cancer. Since his health has improved, he has not motivated to help around the home or travel again. She has been more upset and reports some mild depression. She is thinking about possibly going on a trip to see relatives soon. Not interested in additional interventions at this time. She was seeing marriage counselor, but stopped.    She reports not exercising like she use to. At times she feels more fatigues. Unsure if related to above.   Would like mammogram and DEXA scan every other year. Due 07/2023. Denies changes to breast. Reports taking caltrate daily. She has not been going to gym, but stays active maintain household. No recent falls or injuries. She is still taking calcium supplement.   Still seeing Dr. Al Corpus for left foot capulitis. Plans to receive steroid injection next week.   Upset she has gained  weight. Weight up abut 20 lbs. She reports having old phentermine at home, but has not taken any. Asking for recommendations to weight loss clinic to get new prescription.   No health complaints at this time.   She does not want shingles or additional covid boosters. Discussed Tdap and pneumonia vaccine today.    Review of Systems:  Review of Systems  Constitutional:  Negative for chills, fever, malaise/fatigue and weight loss.  HENT:  Negative for congestion and sore throat.   Eyes:  Negative for discharge.  Respiratory:  Negative for cough, shortness of breath and wheezing.   Cardiovascular:  Negative for chest pain and leg swelling.  Gastrointestinal:  Negative for abdominal pain, blood in stool, constipation, diarrhea, heartburn, nausea and vomiting.  Genitourinary:  Negative for dysuria, frequency and hematuria.  Musculoskeletal:  Negative for falls, joint pain and myalgias.  Skin:  Negative for rash.  Neurological:  Negative for dizziness, weakness and headaches.  Psychiatric/Behavioral:  Positive for depression. Negative for suicidal ideas. The patient is not nervous/anxious and does not have insomnia.     Past Medical History:  Diagnosis Date   Allergy    dilantin =rash   Arthritis    back   Breast cancer ([facility]) 09/13/2011   Complication of anesthesia    hard to wake up-goes out fast   Liver hemangioma    been follow on CT scan since 2008   Neck pain    Thyroid disease    Past Surgical  History:  Procedure Laterality Date   ABDOMINAL HYSTERECTOMY     BREAST SURGERY  1988   rt br bx-negative   COLONOSCOPY     LYMPH NODE BIOPSY  10/14/2011   Procedure: LYMPH NODE BIOPSY;  Surgeon: Edward Jolly, MD;  Location: [place];  Service: General;  Laterality: Right;   right foot surgery     toe implant   Social History:   reports that she has never smoked. She has never used smokeless tobacco. She reports current alcohol use of about 5.0 standard  drinks of alcohol per week. She reports that she does not use drugs.  Family History  Problem Relation Age of Onset   Cancer Mother 95       pancreatic cancer   Cancer Father 73       colon cancer   Cancer Sister 49       breast cancer   Cancer Sister    Cancer Maternal Grandmother 32       brain cancer   Cancer Paternal Grandmother        Brain   Cancer Paternal Aunt        breast   Breast cancer Daughter    Liver disease Son    Alcoholism Son     Medications: Patient's Medications  New Prescriptions   No medications on file  Previous Medications   LEVOTHYROXINE (SYNTHROID) 88 MCG TABLET    Take 1 tablet (88 mcg total) by mouth daily.   MECLIZINE (ANTIVERT) 25 MG TABLET    Take 25 mg by mouth 3 (three) times daily as needed for dizziness.   NUTRITIONAL SUPPLEMENTS (WELLNESS ESSENTIALS PO)    Take 1 capsule by mouth daily. Essential 1 with Q-10  Modified Medications   No medications on file  Discontinued Medications   No medications on file    Physical Exam:  Vitals:   04/18/22 1337  BP: 122/74  Pulse: 74  Temp: 97.8 F (36.6 C)  TempSrc: Temporal  SpO2: 97%  Weight: 177 lb 3.2 oz (80.4 kg)  Height: $Remove'5\' 6"'[id number]$  (1.676 m)   Body mass index is 28.6 kg/m. Wt Readings from Last 3 Encounters:  04/18/22 177 lb 3.2 oz (80.4 kg)  08/23/21 154 lb 12.8 oz (70.2 kg)  06/07/21 162 lb (73.5 kg)    Physical Exam Vitals reviewed.  Constitutional:      General: She is not in acute distress. HENT:     Head: Normocephalic.  Eyes:     General:        Right eye: No discharge.        Left eye: No discharge.  Neck:     Thyroid: No thyroid mass.  Cardiovascular:     Rate and Rhythm: Normal rate and regular rhythm.     Pulses: Normal pulses.     Heart sounds: Normal heart sounds.  Pulmonary:     Effort: Pulmonary effort is normal. No respiratory distress.     Breath sounds: Normal breath sounds. No wheezing.  Abdominal:     General: Bowel sounds are normal. There is no  distension.     Palpations: Abdomen is soft.     Tenderness: There is no abdominal tenderness.  Musculoskeletal:     Cervical back: Neck supple.     Right lower leg: No edema.     Left lower leg: No edema.  Skin:    General: Skin is warm and dry.     Capillary Refill: Capillary refill  takes less than 2 seconds.  Neurological:     General: No focal deficit present.     Mental Status: She is alert and oriented to person, place, and time. Mental status is at baseline.     Motor: No weakness.     Gait: Gait normal.  Psychiatric:        Mood and Affect: Mood normal.        Behavior: Behavior normal.     Labs reviewed: Basic Metabolic Panel: Recent Labs    08/21/21 1403 10/10/21 1419  NA 143  --   K 3.8  --   CL 106  --   CO2 27  --   GLUCOSE 117  --   BUN 9  --   CREATININE 0.65  --   CALCIUM 9.9  --   TSH 0.04* 0.43   Liver Function Tests: Recent Labs    08/21/21 1403  AST 25  ALT 20  BILITOT 1.2  PROT 6.9   No results for input(s): "LIPASE", "AMYLASE" in the last 8760 hours. No results for input(s): "AMMONIA" in the last 8760 hours. CBC: Recent Labs    08/21/21 1403  WBC 3.7*  NEUTROABS 2,313  HGB 13.1  HCT 38.8  MCV 90.2  PLT 190   Lipid Panel: No results for input(s): "CHOL", "HDL", "LDLCALC", "TRIG", "CHOLHDL", "LDLDIRECT" in the last 8760 hours. TSH: Recent Labs    08/21/21 1403 10/10/21 1419  TSH 0.04* 0.43   A1C: No results found for: "HGBA1C"   Assessment/Plan 1. Acquired hypothyroidism - TSH 0.43 10/10/2021 - reports some fatigue and weight gain today - cont levothyroxine - TSH  2. 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 Capsulitis of left foot - followed by Dr. Milinda Pointer - receiving steroid  injections  4. Caregiver stress - husband almost recovered from prostate cancer  5. Reactive depression - reports some marital issues- tried counseling in past - not interested in interventions at this time - recommend PHQ in future if symptoms persist  6. 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 Weight gain - weight increased by 20 lbs - reports not exercising - was on Phentermine  - CBC with Differential/Platelet - CMP  8. Age-related osteoporosis without current pathological fracture - DEXA 07/26/2021- t score - 2.7 - remains on calcium supplement - not interested in other interventions - recommend restarting weight bearing exercise  Total time: 34. Greater than 50% of total time spent doing patient education regarding depression, caregiver strain, weight gain, and health maintenance.   Next appt: Visit date not found  [place], [facility] [phone]

## 2022-04-18 NOTE — Patient Instructions (Addendum)
[facility]- off [address]  Please get Tdap (tetanus) and Prevnar 20 vaccine at local pharmacy

## 2022-04-19 ENCOUNTER — Other Ambulatory Visit: Payer: Self-pay | Admitting: Orthopedic Surgery

## 2022-04-19 DIAGNOSIS — R748 Abnormal levels of other serum enzymes: Secondary | ICD-10-CM

## 2022-04-19 LAB — CBC WITH DIFFERENTIAL/PLATELET
Absolute Monocytes: 348 cells/uL (ref 200–950)
Basophils Absolute: 30 cells/uL (ref 0–200)
Basophils Relative: 0.7 %
Eosinophils Absolute: 151 cells/uL (ref 15–500)
Eosinophils Relative: 3.5 %
HCT: 40.9 % (ref 35.0–45.0)
Hemoglobin: 13.7 g/dL (ref 11.7–15.5)
Lymphs Abs: 1419 cells/uL (ref 850–3900)
MCH: 31.2 pg (ref 27.0–33.0)
MCHC: 33.5 g/dL (ref 32.0–36.0)
MCV: 93.2 fL (ref 80.0–100.0)
MPV: 11.6 fL (ref 7.5–12.5)
Monocytes Relative: 8.1 %
Neutro Abs: 2352 cells/uL (ref 1500–7800)
Neutrophils Relative %: 54.7 %
Platelets: 194 10*3/uL (ref 140–400)
RBC: 4.39 10*6/uL (ref 3.80–5.10)
RDW: 13.7 % (ref 11.0–15.0)
Total Lymphocyte: 33 %
WBC: 4.3 10*3/uL (ref 3.8–10.8)

## 2022-04-19 LAB — TSH: TSH: 0.66 mIU/L (ref 0.40–4.50)

## 2022-04-19 LAB — COMPREHENSIVE METABOLIC PANEL
AG Ratio: 1.6 (calc) (ref 1.0–2.5)
ALT: 45 U/L — ABNORMAL HIGH (ref 6–29)
AST: 45 U/L — ABNORMAL HIGH (ref 10–35)
Albumin: 4.5 g/dL (ref 3.6–5.1)
Alkaline phosphatase (APISO): 99 U/L (ref 37–153)
BUN: 11 mg/dL (ref 7–25)
CO2: 23 mmol/L (ref 20–32)
Calcium: 9.9 mg/dL (ref 8.6–10.4)
Chloride: 110 mmol/L (ref 98–110)
Creat: 0.63 mg/dL (ref 0.60–0.95)
Globulin: 2.8 g/dL (calc) (ref 1.9–3.7)
Glucose, Bld: 104 mg/dL — ABNORMAL HIGH (ref 65–99)
Potassium: 4.2 mmol/L (ref 3.5–5.3)
Sodium: 142 mmol/L (ref 135–146)
Total Bilirubin: 1.1 mg/dL (ref 0.2–1.2)
Total Protein: 7.3 g/dL (ref 6.1–8.1)

## 2022-04-22 ENCOUNTER — Other Ambulatory Visit: Payer: Self-pay

## 2022-04-22 DIAGNOSIS — R748 Abnormal levels of other serum enzymes: Secondary | ICD-10-CM

## 2022-04-23 ENCOUNTER — Ambulatory Visit: Payer: Medicare Other | Admitting: Podiatry

## 2022-04-25 ENCOUNTER — Ambulatory Visit: Payer: Medicare Other | Admitting: Podiatry

## 2022-05-03 ENCOUNTER — Other Ambulatory Visit: Payer: Medicare Other

## 2022-05-07 ENCOUNTER — Other Ambulatory Visit: Payer: Self-pay

## 2022-05-07 DIAGNOSIS — R748 Abnormal levels of other serum enzymes: Secondary | ICD-10-CM

## 2022-05-08 ENCOUNTER — Other Ambulatory Visit: Payer: Medicare Other

## 2022-05-08 LAB — HEPATIC FUNCTION PANEL
AG Ratio: 1.8 (calc) (ref 1.0–2.5)
ALT: 41 U/L — ABNORMAL HIGH (ref 6–29)
AST: 45 U/L — ABNORMAL HIGH (ref 10–35)
Albumin: 4.4 g/dL (ref 3.6–5.1)
Alkaline phosphatase (APISO): 100 U/L (ref 37–153)
Bilirubin, Direct: 0.2 mg/dL (ref 0.0–0.2)
Globulin: 2.5 g/dL (ref 1.9–3.7)
Indirect Bilirubin: 0.6 mg/dL (ref 0.2–1.2)
Total Bilirubin: 0.8 mg/dL (ref 0.2–1.2)
Total Protein: 6.9 g/dL (ref 6.1–8.1)

## 2022-05-09 ENCOUNTER — Other Ambulatory Visit: Payer: Self-pay | Admitting: Orthopedic Surgery

## 2022-05-09 ENCOUNTER — Encounter: Payer: Self-pay | Admitting: Podiatry

## 2022-05-09 ENCOUNTER — Ambulatory Visit (INDEPENDENT_AMBULATORY_CARE_PROVIDER_SITE_OTHER): Payer: Medicare Other | Admitting: Podiatry

## 2022-05-09 DIAGNOSIS — M778 Other enthesopathies, not elsewhere classified: Secondary | ICD-10-CM | POA: Diagnosis not present

## 2022-05-09 DIAGNOSIS — R748 Abnormal levels of other serum enzymes: Secondary | ICD-10-CM

## 2022-05-09 DIAGNOSIS — M7751 Other enthesopathy of right foot: Secondary | ICD-10-CM | POA: Diagnosis not present

## 2022-05-09 MED ORDER — TRIAMCINOLONE ACETONIDE 40 MG/ML IJ SUSP
40.0000 mg | Freq: Once | INTRAMUSCULAR | Status: AC
  Administered 2022-05-09: 40 mg

## 2022-05-09 NOTE — Progress Notes (Signed)
She presents today for follow-up of her capsulitis left first MTPJ, subtalar joint right.  Objective: Vital signs are stable alert oriented x3.  Pulses are palpable.  She has pain on end range of motion of the subtalar joint right and first metatarsophalangeal joint left with hallux limitus.  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  Plan: At this point I injected Kenalog and local anesthetic to the subtalar joint right as well as the left first metatarsal phalangeal joint.

## 2022-05-29 ENCOUNTER — Ambulatory Visit
Admission: RE | Admit: 2022-05-29 | Discharge: 2022-05-29 | Disposition: A | Discharge status: Home / Self Care | Payer: Medicare Other | Source: Ambulatory Visit | Attending: Orthopedic Surgery | Admitting: Orthopedic Surgery

## 2022-05-29 DIAGNOSIS — R748 Abnormal levels of other serum enzymes: Secondary | ICD-10-CM

## 2022-05-30 ENCOUNTER — Other Ambulatory Visit: Payer: Self-pay | Admitting: Orthopedic Surgery

## 2022-05-30 ENCOUNTER — Other Ambulatory Visit: Payer: Medicare Other

## 2022-05-30 DIAGNOSIS — R748 Abnormal levels of other serum enzymes: Secondary | ICD-10-CM

## 2022-06-03 ENCOUNTER — Other Ambulatory Visit: Payer: Self-pay | Admitting: Orthopedic Surgery

## 2022-06-03 DIAGNOSIS — Z780 Asymptomatic menopausal state: Secondary | ICD-10-CM

## 2022-08-08 ENCOUNTER — Ambulatory Visit: Payer: Medicare Other | Admitting: Podiatry

## 2022-08-24 ENCOUNTER — Other Ambulatory Visit: Payer: Self-pay | Admitting: Orthopedic Surgery

## 2022-08-24 DIAGNOSIS — E039 Hypothyroidism, unspecified: Secondary | ICD-10-CM

## 2022-08-26 ENCOUNTER — Encounter: Payer: Self-pay | Admitting: Podiatry

## 2022-08-26 ENCOUNTER — Ambulatory Visit (INDEPENDENT_AMBULATORY_CARE_PROVIDER_SITE_OTHER): Payer: Medicare Other | Admitting: Podiatry

## 2022-08-26 ENCOUNTER — Ambulatory Visit: Payer: Medicare Other | Admitting: Podiatry

## 2022-08-26 DIAGNOSIS — M778 Other enthesopathies, not elsewhere classified: Secondary | ICD-10-CM | POA: Diagnosis not present

## 2022-08-26 DIAGNOSIS — M7751 Other enthesopathy of right foot: Secondary | ICD-10-CM

## 2022-08-26 MED ORDER — TRIAMCINOLONE ACETONIDE 40 MG/ML IJ SUSP
20.0000 mg | Freq: Once | INTRAMUSCULAR | Status: AC
  Administered 2022-08-26: 20 mg

## 2022-08-26 MED ORDER — DEXAMETHASONE SODIUM PHOSPHATE 120 MG/30ML IJ SOLN
2.0000 mg | Freq: Once | INTRAMUSCULAR | Status: AC
  Administered 2022-08-26: 2 mg via INTRA_ARTICULAR

## 2022-08-26 NOTE — Progress Notes (Signed)
She presents today chief complaint of painful first metatarsophalangeal joint and painful subtalar joint of her left foot.  States he was doing pretty good for a while but I think need another set of injections as she refers to the last time she was here back in June.  Objective: Vital signs stable alert and oriented x3.  Pulses are palpable.  There is no erythema edema salines drainage or odor she does have pain on end range of motion subtalar joint right and first metatarsophalangeal joint left.  Assessment: Subtalar joint capsulitis right capsulitis osteoarthritis first metatarsophalangeal joint left.  Plan: Discussed etiology pathology and surgical therapies at this point in time after sterile Betadine skin prep I injected dexamethasone to the first metatarsophalangeal joint of the left foot and the subtalar joint of the right foot was injected with Kenalog and local anesthetic.  I will follow-up with her in 3 months

## 2022-11-26 ENCOUNTER — Ambulatory Visit (INDEPENDENT_AMBULATORY_CARE_PROVIDER_SITE_OTHER): Payer: Medicare Other | Admitting: Podiatry

## 2022-11-26 ENCOUNTER — Encounter: Payer: Self-pay | Admitting: Podiatry

## 2022-11-26 DIAGNOSIS — M778 Other enthesopathies, not elsewhere classified: Secondary | ICD-10-CM

## 2022-11-26 MED ORDER — TRIAMCINOLONE ACETONIDE 40 MG/ML IJ SUSP
20.0000 mg | Freq: Once | INTRAMUSCULAR | Status: AC
  Administered 2022-11-26: 20 mg
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
## 2023-01-16 ENCOUNTER — Other Ambulatory Visit: Payer: Self-pay

## 2023-01-16 ENCOUNTER — Emergency Department (HOSPITAL_BASED_OUTPATIENT_CLINIC_OR_DEPARTMENT_OTHER): Payer: Medicare Other | Admitting: Radiology

## 2023-01-16 ENCOUNTER — Emergency Department (HOSPITAL_BASED_OUTPATIENT_CLINIC_OR_DEPARTMENT_OTHER)
Admission: EM | Admit: 2023-01-16 | Discharge: 2023-01-16 | Disposition: A | Discharge status: Home / Self Care | Payer: Medicare Other | Attending: Emergency Medicine | Admitting: Emergency Medicine

## 2023-01-16 DIAGNOSIS — Y9301 Activity, walking, marching and hiking: Secondary | ICD-10-CM | POA: Insufficient documentation

## 2023-01-16 DIAGNOSIS — S82831A Other fracture of upper and lower end of right fibula, initial encounter for closed fracture: Secondary | ICD-10-CM | POA: Diagnosis not present

## 2023-01-16 DIAGNOSIS — S99911A Unspecified injury of right ankle, initial encounter: Secondary | ICD-10-CM | POA: Diagnosis present

## 2023-01-16 DIAGNOSIS — M79671 Pain in right foot: Secondary | ICD-10-CM | POA: Insufficient documentation

## 2023-01-16 DIAGNOSIS — W108XXA Fall (on) (from) other stairs and steps, initial encounter: Secondary | ICD-10-CM | POA: Diagnosis not present

## 2023-01-16 MED ORDER — HYDROCODONE-ACETAMINOPHEN 5-325 MG PO TABS: 1.0000 | ORAL_TABLET | ORAL | 0 refills | Status: DC | PRN

## 2023-01-16 NOTE — ED Provider Notes (Signed)
[name] Provider Note   CSN: [id number] Arrival date & time: 01/16/23  1453     History  Chief Complaint  Patient presents with   Foot Injury    Krista Dunn is a 82 y.o. female.  Patient is a 82 year old female who presents with pain in her right foot and ankle.  She said that she had an injury to it 5 days ago.  She said that she was going to bed and she remembers walking around the dining room and was walking up the stairs to go to bed and then she apparently fell and was at the bottom of the stairs.  She does not really know what happened.  She does not remember the event.  Her husband says that when he got to her, she was awake and alert and answering questions appropriately.  She was crying about her son who had recently died from an MVC.  Since that time she has had some pain and swelling to her right foot and ankle.  She denies any other injuries.  She had some soreness in her hips initially but says that has resolved.  She does not complain of any head pain.  No neck or back pain.  She did not have any evident head injury.  She is not on anticoagulants.  She said this has not happened to her before.  She has not been complaining of any palpitations.  No chest pain or shortness of breath.  She says she has some dizziness at times from a chronic ear issue but has not had any recent episodes of dizziness.  Otherwise she says she has been feeling fine with no recent illnesses.       Home Medications Prior to Admission medications   Medication Sig Start Date End Date Taking? Authorizing Provider  HYDROcodone-acetaminophen (NORCO/VICODIN) 5-325 MG tablet Take 1 tablet by mouth every 4 (four) hours as needed. 01/16/23  Yes Malvin Johns, MD  calcium carbonate (OSCAL) 1500 (600 Ca) MG TABS tablet Take by mouth 2 (two) times daily with a meal.    [provider]  levothyroxine (SYNTHROID) 88 MCG tablet TAKE 1 TABLET(88 MCG) BY  MOUTH DAILY 08/26/22   Fargo, Amy E, NP  meclizine (ANTIVERT) 25 MG tablet Take 25 mg by mouth 3 (three) times daily as needed for dizziness.    [provider]  Nutritional Supplements (WELLNESS ESSENTIALS PO) Take 1 capsule by mouth daily. Essential 1 with Q-10    [provider]      Allergies    Epinephrine and Procaine hcl    Review of Systems   Review of Systems  Constitutional:  Negative for chills, diaphoresis, fatigue and fever.  HENT:  Negative for congestion, rhinorrhea and sneezing.   Eyes: Negative.   Respiratory:  Negative for cough, chest tightness and shortness of breath.   Cardiovascular:  Negative for chest pain and leg swelling.  Gastrointestinal:  Negative for abdominal pain, blood in stool, diarrhea, nausea and vomiting.  Genitourinary:  Negative for difficulty urinating, flank pain, frequency and hematuria.  Musculoskeletal:  Positive for arthralgias. Negative for back pain.  Skin:  Negative for rash.  Neurological:  Negative for dizziness, speech difficulty, weakness, numbness and headaches.    Physical Exam Updated Vital Signs BP (!) 155/91 (BP Location: Right Arm)   Pulse 81   Temp 97.9 F (36.6 C)   Resp 18   Ht '5\' 5"'$  (1.651 m)   Wt 80.4  kg   SpO2 100%   BMI 29.50 kg/m  Physical Exam Constitutional:      Appearance: She is well-developed.  HENT:     Head: Normocephalic and atraumatic.  Eyes:     Pupils: Pupils are equal, round, and reactive to light.  Cardiovascular:     Rate and Rhythm: Normal rate and regular rhythm.     Heart sounds: Normal heart sounds.  Pulmonary:     Effort: Pulmonary effort is normal. No respiratory distress.     Breath sounds: Normal breath sounds. No wheezing or rales.  Chest:     Chest wall: No tenderness.  Abdominal:     General: Bowel sounds are normal.     Palpations: Abdomen is soft.     Tenderness: There is no abdominal tenderness. There is no guarding or rebound.  Musculoskeletal:         General: Normal range of motion.     Cervical back: Normal range of motion and neck supple.     Comments: Patient has swelling and ecchymosis to the right lower leg including the ankle and foot.  There is tenderness over the lateral aspect of the right ankle.  Pedal pulses are intact.  She has normal sensation and motor function distally.  Compartments are soft.  Lymphadenopathy:     Cervical: No cervical adenopathy.  Skin:    General: Skin is warm and dry.     Findings: No rash.  Neurological:     General: No focal deficit present.     Mental Status: She is alert and oriented to person, place, and time.     ED Results / Procedures / Treatments   Labs (all labs ordered are listed, but only abnormal results are displayed) Labs Reviewed - No data to display  EKG None  Radiology DG Ankle Complete Right  Result Date: 01/16/2023 CLINICAL DATA:  Fall with pain, swelling and bruising. EXAM: RIGHT ANKLE - COMPLETE 3+ VIEW COMPARISON:  None Available. FINDINGS: Mildly comminuted and displaced distal fibular fracture. Fracture is at and just above the level of the ankle mortise. There is no definite mortise widening. No additional fracture of the ankle. Generalized soft tissue edema most prominent laterally. No erosive change or focal bone abnormality. IMPRESSION: Mildly comminuted and displaced distal fibular fracture. Generalized soft tissue edema. Electronically Signed   By: Keith Rake M.D.   On: 01/16/2023 15:48   DG Foot Complete Right  Result Date: 01/16/2023 CLINICAL DATA:  Fall with pain, swelling and bruising. EXAM: RIGHT FOOT COMPLETE - 3+ VIEW COMPARISON:  Concurrent ankle exam, reported separately. Foot radiograph 08/15/2020 FINDINGS: Distal fibular fracture is better assessed on concurrent ankle exam, reported separately. No additional acute fracture of the foot. Partial arthroplasty of the first metatarsal phalangeal joint. No erosive change or periostitis. Soft tissue edema seen  over the dorsum of the foot. IMPRESSION: 1. Distal fibular fracture is better assessed on concurrent ankle exam, reported separately. No additional acute fracture of the foot. 2. Partial arthroplasty of the first metatarsophalangeal joint. Electronically Signed   By: Keith Rake M.D.   On: 01/16/2023 15:47    Procedures Procedures    Medications Ordered in ED Medications - No data to display  ED Course/ Medical Decision Making/ A&P                             Medical Decision Making Amount and/or Complexity of Data Reviewed Radiology: ordered.  Risk Prescription drug management.   Patient presents after a fall on Saturday.  Is unclear what led to the fall.  It sounds like she may have had a syncopal event.  She went to drinking alcohol earlier in the evening but did not have any send she got home around 5:00 PM she says.  She did say that she had felt a little queasy since she had eaten dinner that evening and felt like she was not feeling good because of what she ate.  She had 1 episode of vomiting the next morning but nothing since then.  And she has been asymptomatic since that time other than the ankle pain.  At this point, since it has been several days since the incident, I did not feel that it would be beneficial to do lab work or other evaluation of the potential syncopal event.  She did have an EKG that did not show any arrhythmias or significant bradycardia.  She had x-rays of her right ankle and right foot which were interpreted by me and confirmed by the radiologist to show a distal fibula fracture.  No other fractures were identified.  She was placed in a cam walker.  She was advised on ice and elevation.  She did request a prescription for some pain medication and was given a short course of Vicodin.  She was given a referral to follow-up with orthopedics.  She was encouraged to call tomorrow for an appointment.  She does have an appointment with her PCP on Tuesday and was  encouraged to keep this.  Return precautions were given.  Final Clinical Impression(s) / ED Diagnoses Final diagnoses:  Closed fracture of distal end of right fibula, unspecified fracture morphology, initial encounter    Rx / DC Orders ED Discharge Orders          Ordered    HYDROcodone-acetaminophen (NORCO/VICODIN) 5-325 MG tablet  Every 4 hours PRN        01/16/23 1620              Malvin Johns, MD 01/16/23 1901

## 2023-01-16 NOTE — ED Triage Notes (Signed)
[facility] [address] last sat , has no memory of it and hurt rt foot and ankle, , rt foot and foot are swollen black and blue from mid leg down, hurts to walk on it

## 2023-01-23 ENCOUNTER — Ambulatory Visit (INDEPENDENT_AMBULATORY_CARE_PROVIDER_SITE_OTHER): Payer: Medicare Other | Admitting: Orthopedic Surgery

## 2023-01-23 ENCOUNTER — Encounter: Payer: Self-pay | Admitting: Orthopedic Surgery

## 2023-01-23 VITALS — BP 126/80 | HR 66 | Temp 97.1°F | Ht 65.0 in | Wt 171.0 lb

## 2023-01-23 DIAGNOSIS — S82831D Other fracture of upper and lower end of right fibula, subsequent encounter for closed fracture with routine healing: Secondary | ICD-10-CM

## 2023-01-23 DIAGNOSIS — E039 Hypothyroidism, unspecified: Secondary | ICD-10-CM

## 2023-01-23 DIAGNOSIS — R748 Abnormal levels of other serum enzymes: Secondary | ICD-10-CM

## 2023-01-23 DIAGNOSIS — R4189 Other symptoms and signs involving cognitive functions and awareness: Secondary | ICD-10-CM | POA: Diagnosis not present

## 2023-01-23 DIAGNOSIS — D329 Benign neoplasm of meninges, unspecified: Secondary | ICD-10-CM

## 2023-01-23 DIAGNOSIS — M810 Age-related osteoporosis without current pathological fracture: Secondary | ICD-10-CM

## 2023-01-23 DIAGNOSIS — Z853 Personal history of malignant neoplasm of breast: Secondary | ICD-10-CM

## 2023-01-23 DIAGNOSIS — H9311 Tinnitus, right ear: Secondary | ICD-10-CM

## 2023-01-23 MED ORDER — HYDROCODONE-ACETAMINOPHEN 5-325 MG PO TABS: 1.0000 | ORAL_TABLET | ORAL | 0 refills | Status: DC | PRN

## 2023-01-23 NOTE — Progress Notes (Signed)
Careteam: Patient Care Team: Yvonna Alanis, NP as PCP - General (Adult Health Nurse Practitioner) Cameron Sprang, MD as Consulting Physician (Neurology)  Seen by: Windell Moulding, AGNP-C  PLACE OF SERVICE:  [facility] Directive information    Allergies  Allergen Reactions   Epinephrine     REACTION: rapid heart rate   Procaine Hcl     REACTION: rapid heart rate, heart skips beat    Chief Complaint  Patient presents with   Medical Management of Chronic Issues    9 month follow up.    Health Maintenance    Discuss the need for AWV.   Immunizations    Discuss the need for Pne vaccine, Influenza vaccine, and DTAP vaccine.NCIR verified     HPI: Patient is a 82 y.o. female seen today for medical management of chronic conditions.   03/02 fell walking stairs> 03/07 ED visit> Xray right foot distal fibular fracture. She is in scheduled to f/u with Dr. Gladstone Lighter 02/27/2023. Still having pain. She is out of Noro. Requesting refill.   DEXA scan 07/2021> t score -1.5. She has been taking calcium supplement. Unsure it has vitamin D in it.   Mood has improved since husband is feeling better. Denies depression. Off medication.   Tinnitus has not resolved. 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 Advised to try lipo flavonoid> did not work. Antihistamine did not help symptoms either. Tinnitis worse at night.   Meningioma noted 2014. F/u with neurology 2021. MRI brain 2021 unchanged from 2014. She was noted to have memory issues I 2021, neurocognitive testing recommended but was cancelled. SLUMS score 24/30 2021. She continues to have memory issues today. MRI brain 2021 noted mild chronic small vessel ischemic changes to hemispheric white matter> progressive from 2014.     Review of Systems:  Review of Systems  Constitutional:  Negative for chills and fever.  HENT:  Positive for hearing loss and tinnitus.   Eyes:  Negative for  blurred vision and double vision.  Respiratory:  Negative for cough, shortness of breath and wheezing.   Cardiovascular:  Negative for chest pain and leg swelling.  Gastrointestinal:  Negative for abdominal pain and blood in stool.  Genitourinary:  Negative for dysuria.  Musculoskeletal:  Positive for falls and joint pain.  Skin:  Negative for rash.  Neurological:  Negative for dizziness and headaches.  Psychiatric/Behavioral:  Positive for memory loss. Negative for depression. The patient has insomnia. The patient is not nervous/anxious.     Past Medical History:  Diagnosis Date   Allergy    dilantin =rash   Arthritis    back   Breast cancer ([place]) 0000000   Complication of anesthesia    hard to wake up-goes out fast   Liver hemangioma    been follow on CT scan since 2008   Neck pain    Thyroid disease    Past Surgical History:  Procedure Laterality Date   ABDOMINAL HYSTERECTOMY     BREAST SURGERY  1988   rt br bx-negative   COLONOSCOPY     LYMPH NODE BIOPSY  10/14/2011   Procedure: LYMPH NODE BIOPSY;  Surgeon: Edward Jolly, MD;  Location: [facility];  Service: General;  Laterality: Right;   right foot surgery     toe implant   Social History:   reports that she has never smoked. She has never used smokeless tobacco. She reports current alcohol  use of about 5.0 standard drinks of alcohol per week. She reports that she does not use drugs.  Family History  Problem Relation Age of Onset   Cancer Mother 67       pancreatic cancer   Cancer Father 32       colon cancer   Cancer Sister 8       breast cancer   Cancer Sister    Cancer Maternal Grandmother 70       brain cancer   Cancer Paternal Grandmother        Brain   Cancer Paternal Aunt        breast   Breast cancer Daughter    Liver disease Son    Alcoholism Son     Medications: Patient's Medications  New Prescriptions   No medications on file  Previous Medications   CALCIUM  CARBONATE (OSCAL) 1500 (600 CA) MG TABS TABLET    Take by mouth 2 (two) times daily with a meal.   HYDROCODONE-ACETAMINOPHEN (NORCO/VICODIN) 5-325 MG TABLET    Take 1 tablet by mouth every 4 (four) hours as needed.   LEVOTHYROXINE (SYNTHROID) 88 MCG TABLET    TAKE 1 TABLET(88 MCG) BY MOUTH DAILY   MECLIZINE (ANTIVERT) 25 MG TABLET    Take 25 mg by mouth 3 (three) times daily as needed for dizziness.   NUTRITIONAL SUPPLEMENTS (WELLNESS ESSENTIALS PO)    Take 1 capsule by mouth daily. Essential 1 with Q-10  Modified Medications   No medications on file  Discontinued Medications   No medications on file    Physical Exam:  There were no vitals filed for this visit. There is no height or weight on file to calculate BMI. Wt Readings from Last 3 Encounters:  01/16/23 177 lb 4 oz (80.4 kg)  04/18/22 177 lb 3.2 oz (80.4 kg)  08/23/21 154 lb 12.8 oz (70.2 kg)    Physical Exam Vitals reviewed.  Constitutional:      General: She is not in acute distress. HENT:     Head: Normocephalic.  Eyes:     General:        Right eye: No discharge.        Left eye: No discharge.  Cardiovascular:     Rate and Rhythm: Normal rate and regular rhythm.     Pulses: Normal pulses.     Heart sounds: Normal heart sounds.  Pulmonary:     Effort: Pulmonary effort is normal. No respiratory distress.     Breath sounds: Normal breath sounds. No wheezing.  Abdominal:     General: Bowel sounds are normal. There is no distension.     Palpations: Abdomen is soft.     Tenderness: There is no abdominal tenderness.  Musculoskeletal:     Cervical back: Neck supple.     Left lower leg: No edema.     Comments: RLE UTA due to CAM boot  Skin:    General: Skin is warm.     Capillary Refill: Capillary refill takes less than 2 seconds.  Neurological:     General: No focal deficit present.     Mental Status: She is alert and oriented to person, place, and time.  Psychiatric:        Mood and Affect: Mood normal.         Behavior: Behavior normal.     Labs reviewed: Basic Metabolic Panel: Recent Labs    04/18/22 1422  NA 142  K 4.2  CL 110  CO2 23  GLUCOSE 104*  BUN 11  CREATININE 0.63  CALCIUM 9.9  TSH 0.66   Liver Function Tests: Recent Labs    04/18/22 1422 05/08/22 1438  AST 45* 45*  ALT 45* 41*  BILITOT 1.1 0.8  PROT 7.3 6.9   No results for input(s): "LIPASE", "AMYLASE" in the last 8760 hours. No results for input(s): "AMMONIA" in the last 8760 hours. CBC: Recent Labs    04/18/22 1422  WBC 4.3  NEUTROABS 2,352  HGB 13.7  HCT 40.9  MCV 93.2  PLT 194   Lipid Panel: No results for input(s): "CHOL", "HDL", "LDLCALC", "TRIG", "CHOLHDL", "LDLDIRECT" in the last 8760 hours. TSH: Recent Labs    04/18/22 1422  TSH 0.66   A1C: No results found for: "HGBA1C"   Assessment/Plan 1. Closed fracture of distal end of right fibula with routine healing, unspecified fracture morphology, subsequent encounter - 03/02 mechanical fall - 03/07 ED visit> distal right fib fracture - scheduled to see Dr. Gladstone Lighter 03/18 - will having intermittent pain - will refill norco one more time - discussed falls prevention in home - HYDROcodone-acetaminophen (NORCO/VICODIN) 5-325 MG tablet; Take 1 tablet by mouth every 4 (four) hours as needed.  Dispense: 10 tablet; Refill: 0  2. Acquired hypothyroidism - TSH 0.66 04/2022 - cont levothyroxine - TSH  3. Elevated liver enzymes - AST/ALT 45/41 04/2022 - she was drinking more at that time - plan to recheck today - Complete Metabolic Panel with eGFR  4. Cognitive impairment - ongoing - seen by neurology 2021> SLUMS 24/30> cancelled neurocognitive testing - MRI brain 2021 noted mild chronic small vessel ischemic changes to hemispheric white matter> progressive from 2014 - appears more forgetful today  - CBC with Differential/Platelet  5. Meningioma [facility]) - followed by neurology - MRI brain 2021 unchanged from 2014  6. History of breast  cancer - invasive ductal carcinoma of the right breast  - BRCA1 and BRCA 2 negative - right breast radiation completed 01/2012 - unable to take armidex due to hives  7. Age-related osteoporosis without current pathological fracture - DEXA 2022, t score -1.5 - recent right distal fibula fracture 01/2023  - DG Bone Density; Future  8. Tinnitus of right ear - ongoing - evaluated by Dr. Lucia Gaskins - unsuccessful trial lipo flavonoid  Total time: 35 minutes. Greater than 50% of total time spent doing patient education regarding health maintenance, right foot pain, hypothyroidism, cognitive impairment, osteoporosis, and falls safety.     Next appt: 04/24/2023  Windell Moulding, [facility] [phone]

## 2023-01-23 NOTE — Patient Instructions (Addendum)
Call [facility] and please schedule mammogram and bone density AFTER 07/27/2023  Look up caltrate supplement for bone health   Contact Dr. Lucia Gaskins (ENT) for tinnitus  Contact Ellouise Newer with [facility]> schedule f/u on meningioma/memory issues  Hydrocodone can cause constipation> drink water, eat prunes or take miralax/colace for prevention

## 2023-01-24 LAB — COMPLETE METABOLIC PANEL WITH GFR
AG Ratio: 1.8 (calc) (ref 1.0–2.5)
ALT: 29 U/L (ref 6–29)
AST: 33 U/L (ref 10–35)
Albumin: 4.5 g/dL (ref 3.6–5.1)
Alkaline phosphatase (APISO): 116 U/L (ref 37–153)
BUN: 12 mg/dL (ref 7–25)
CO2: 24 mmol/L (ref 20–32)
Calcium: 10 mg/dL (ref 8.6–10.4)
Chloride: 106 mmol/L (ref 98–110)
Creat: 0.7 mg/dL (ref 0.60–0.95)
Globulin: 2.5 g/dL (calc) (ref 1.9–3.7)
Glucose, Bld: 110 mg/dL — ABNORMAL HIGH (ref 65–99)
Potassium: 4 mmol/L (ref 3.5–5.3)
Sodium: 141 mmol/L (ref 135–146)
Total Bilirubin: 1.3 mg/dL — ABNORMAL HIGH (ref 0.2–1.2)
Total Protein: 7 g/dL (ref 6.1–8.1)
eGFR: 87 mL/min/{1.73_m2} (ref >=60)

## 2023-01-24 LAB — CBC WITH DIFFERENTIAL/PLATELET
Absolute Monocytes: 340 cells/uL (ref 200–950)
Basophils Absolute: 30 cells/uL (ref 0–200)
Basophils Relative: 0.7 %
Eosinophils Absolute: 129 cells/uL (ref 15–500)
Eosinophils Relative: 3 %
HCT: 40.4 % (ref 35.0–45.0)
Hemoglobin: 13.3 g/dL (ref 11.7–15.5)
Lymphs Abs: 1333 cells/uL (ref 850–3900)
MCH: 29.7 pg (ref 27.0–33.0)
MCHC: 32.9 g/dL (ref 32.0–36.0)
MCV: 90.2 fL (ref 80.0–100.0)
MPV: 11.8 fL (ref 7.5–12.5)
Monocytes Relative: 7.9 %
Neutro Abs: 2468 cells/uL (ref 1500–7800)
Neutrophils Relative %: 57.4 %
Platelets: 231 10*3/uL (ref 140–400)
RBC: 4.48 10*6/uL (ref 3.80–5.10)
RDW: 13.7 % (ref 11.0–15.0)
Total Lymphocyte: 31 %
WBC: 4.3 10*3/uL (ref 3.8–10.8)

## 2023-01-24 LAB — TSH: TSH: 1.47 mIU/L (ref 0.40–4.50)

## 2023-02-20 ENCOUNTER — Ambulatory Visit (INDEPENDENT_AMBULATORY_CARE_PROVIDER_SITE_OTHER): Payer: Medicare Other | Admitting: Adult Health

## 2023-02-20 ENCOUNTER — Encounter: Payer: Self-pay | Admitting: Adult Health

## 2023-02-20 VITALS — BP 122/68 | HR 65 | Temp 97.5°F | Resp 16 | Ht 66.14 in | Wt 167.4 lb

## 2023-02-20 DIAGNOSIS — Z0000 Encounter for general adult medical examination without abnormal findings: Secondary | ICD-10-CM | POA: Diagnosis not present

## 2023-02-20 MED ORDER — MECLIZINE HCL 25 MG PO TABS: 25.0000 mg | ORAL_TABLET | Freq: Three times a day (TID) | ORAL | 3 refills | Status: AC | PRN

## 2023-02-20 NOTE — Progress Notes (Signed)
Subjective:   Krista Dunn is a 82 y.o. female who presents for Medicare Annual (Subsequent) preventive examination.  Review of Systems     Cardiac Risk Factors include: advanced age (>41men, >18 women)     Objective:    There were no vitals filed for this visit. There is no height or weight on file to calculate BMI.     02/20/2023    1:06 PM 01/23/2023   12:55 PM 04/18/2022    1:05 PM 06/07/2021   10:24 AM 08/10/2020   12:45 PM 07/31/2020    9:51 AM 05/22/2020    2:05 PM  Advanced Directives  Does Patient Have a Medical Advance Directive? Yes Yes Yes Yes Yes Yes Yes  Type of Advance Directive Living will Living will Living will Living will Living will Living will Living will  Does patient want to make changes to medical advance directive? No - Patient declined No - Patient declined No - Patient declined No - Patient declined       Current Medications (verified) Outpatient Encounter Medications as of 02/20/2023  Medication Sig   calcium carbonate (OSCAL) 1500 (600 Ca) MG TABS tablet Take by mouth 2 (two) times daily with a meal.   HYDROcodone-acetaminophen (NORCO/VICODIN) 5-325 MG tablet Take 1 tablet by mouth every 4 (four) hours as needed.   levothyroxine (SYNTHROID) 88 MCG tablet TAKE 1 TABLET(88 MCG) BY MOUTH DAILY   meclizine (ANTIVERT) 25 MG tablet Take 25 mg by mouth 3 (three) times daily as needed for dizziness.   Nutritional Supplements (WELLNESS ESSENTIALS PO) Take 1 capsule by mouth daily. Essential 1 with Q-10   No facility-administered encounter medications on file as of 02/20/2023.    Allergies (verified) Epinephrine and Procaine hcl   History: Past Medical History:  Diagnosis Date   Allergy    dilantin =rash   Arthritis    back   Breast cancer 09/13/2011   Complication of anesthesia    hard to wake up-goes out fast   Liver hemangioma    been follow on CT scan since 2008   Neck pain    Thyroid disease    Past Surgical History:  Procedure Laterality  Date   ABDOMINAL HYSTERECTOMY     BREAST SURGERY  1988   rt br bx-negative   COLONOSCOPY     LYMPH NODE BIOPSY  10/14/2011   Procedure: LYMPH NODE BIOPSY;  Surgeon: Mariella Saa, MD;  Location: [facility];  Service: General;  Laterality: Right;   right foot surgery     toe implant   Family History  Problem Relation Age of Onset   Cancer Mother 24       pancreatic cancer   Cancer Father 17       colon cancer   Cancer Sister 29       breast cancer   Cancer Sister    Cancer Maternal Grandmother 60       brain cancer   Cancer Paternal Grandmother        Brain   Cancer Paternal Aunt        breast   Breast cancer Daughter    Liver disease Son    Alcoholism Son    Social History   Socioeconomic History   Marital status: Married    Spouse name: Not on file   Number of children: 2   Years of education: Not on file   Highest education level: Not on file  Occupational History   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    Employer: RETIRED  Tobacco Use   Smoking status: Never   Smokeless tobacco: Never  Vaping Use   Vaping Use: Never used  Substance and Sexual Activity   Alcohol use: Yes    Alcohol/week: 5.0 standard drinks of alcohol    Types: 5 drink(s) per week   Drug use: No   Sexual activity: Yes  Other Topics Concern   Not on file  Social History Narrative   Right handed    Lives with husband    Social Determinants of Health   Financial Resource Strain: Not on file  Food Insecurity: Not on file  Transportation Needs: Not on file  Physical Activity: Not on file  Stress: Not on file  Social Connections: Not on file    Tobacco Counseling Counseling given: Not Answered   Clinical Intake:                 Diabetic? NO         Activities of Daily Living    02/20/2023    1:07 PM  In your present state of health, do you have any difficulty performing the following activities:  Hearing? 0  Vision? 0  Difficulty  concentrating or making decisions? 0  Walking or climbing stairs? 0  Dressing or bathing? 0  Doing errands, shopping? 0  Preparing Food and eating ? N  Using the Toilet? N  In the past six months, have you accidently leaked urine? N  Do you have problems with loss of bowel control? N  Managing your Medications? N  Managing your Finances? N  Housekeeping or managing your Housekeeping? N    Patient Care Team: Octavia HeirFargo, Amy E, NP as PCP - General (Adult Health Nurse Practitioner) Van ClinesAquino, Karen M, MD as Consulting Physician (Neurology)  Indicate any recent Medical Services you may have received from other than [facility] providers in the past year (date may be approximate).     Assessment:   This is a routine wellness examination for Krista Dunn.  Hearing/Vision screen No results found.  Dietary issues and exercise activities discussed: Current Exercise Habits: The patient does not participate in regular exercise at present, Exercise limited by: orthopedic condition(s)   Goals Addressed   None    Depression Screen    02/20/2023    1:06 PM 01/23/2023   12:50 PM 04/18/2022    1:42 PM 12/21/2020    2:48 PM 07/31/2020   10:06 AM 02/16/2020    3:25 PM 03/24/2019   11:11 AM  PHQ 2/9 Scores  PHQ - 2 Score 1 3 0 0 0 1 0  PHQ- 9 Score 4 8         Fall Risk    02/20/2023    1:06 PM 01/23/2023   12:50 PM 04/18/2022    1:42 PM 08/23/2021    3:18 PM 12/21/2020    2:48 PM  Fall Risk   Falls in the past year? 1 1 0 1 0  Number falls in past yr: 0 0 0 0 0  Injury with Fall? 1 1 0 1 0  Risk for fall due to : History of fall(s) History of fall(s) No Fall Risks History of fall(s)   Follow up  Falls evaluation completed Falls evaluation completed Falls evaluation completed;Education provided;Falls prevention discussed Falls evaluation completed    FALL RISK PREVENTION PERTAINING TO THE HOME:  Any stairs in or around the home? Yes  If so, are there any without handrails? Yes  Home free of loose throw  rugs in walkways, pet beds, electrical cords, etc? No  Adequate lighting in your home to reduce risk of falls? Yes   ASSISTIVE DEVICES UTILIZED TO PREVENT FALLS:  Life alert? No  Use of a cane, walker or w/c? Yes  Grab bars in the bathroom? No  Shower chair or bench in shower? Yes  Elevated toilet seat or a handicapped toilet? Yes   TIMED UP AND GO:  Was the test performed? No .  Length of time to ambulate 10 feet: N/A sec.   Gait steady and fast with assistive device  Cognitive Function:    08/23/2021    3:28 PM  MMSE - Mini Mental State Exam  Orientation to time 5  Orientation to Place 5  Registration 3  Attention/ Calculation 5  Recall 3  Language- name 2 objects 2  Language- repeat 1  Language- follow 3 step command 3  Language- read & follow direction 1  Write a sentence 1  Copy design 1  Total score 30        07/31/2020   10:16 AM 03/24/2019   11:14 AM  6CIT Screen  What Year? 0 points 0 points  What month? 0 points 0 points  What time? 0 points 0 points  Count back from 20 0 points 0 points  Months in reverse 0 points 0 points  Repeat phrase 0 points 0 points  Total Score 0 points 0 points    Immunizations Immunization History  Administered Date(s) Administered   PFIZER(Purple Top)SARS-COV-2 Vaccination 12/01/2019, 12/17/2019    TDAP status: Due, Education has been provided regarding the importance of this vaccine. Advised may receive this vaccine at local pharmacy or Health Dept. Aware to provide a copy of the vaccination record if obtained from local pharmacy or Health Dept. Verbalized acceptance and understanding.  Flu Vaccine status: Declined, Education has been provided regarding the importance of this vaccine but patient still declined. Advised may receive this vaccine at local pharmacy or Health Dept. Aware to provide a copy of the vaccination record if obtained from local pharmacy or Health Dept. Verbalized acceptance and  understanding.  Pneumococcal vaccine status: Declined,  Education has been provided regarding the importance of this vaccine but patient still declined. Advised may receive this vaccine at local pharmacy or Health Dept. Aware to provide a copy of the vaccination record if obtained from local pharmacy or Health Dept. Verbalized acceptance and understanding.   Covid-19 vaccine status: Information provided on how to obtain vaccines.   Qualifies for Shingles Vaccine? Yes   Zostavax completed No   Shingrix Completed?: No.    Education has been provided regarding the importance of this vaccine. Patient has been advised to call insurance company to determine out of pocket expense if they have not yet received this vaccine. Advised may also receive vaccine at local pharmacy or Health Dept. Verbalized acceptance and understanding.  Screening Tests Health Maintenance  Topic Date Due   DTaP/Tdap/Td (1 - Tdap) Never done   Medicare Annual Wellness (AWV)  07/31/2021   Pneumonia Vaccine 58+ Years old (1 of 1 - PCV) 11/12/2023 (Originally 02/18/2007)   INFLUENZA VACCINE  06/12/2023   DEXA SCAN  Completed   HPV VACCINES  Aged Out   COVID-19 Vaccine  Discontinued   Zoster Vaccines- Shingrix  Discontinued    Health Maintenance  Health Maintenance Due  Topic Date Due   DTaP/Tdap/Td (1 - Tdap) Never done   Medicare Annual Wellness (AWV)  07/31/2021    Colorectal cancer screening: Type of screening: Colonoscopy. Completed 6 years ago. Repeat every 10 years  Mammogram status: Ordered will have one in September 2024. Pt provided with contact info and advised to call to schedule appt.   Bone Density status: Ordered 01/23/23. Pt provided with contact info and advised to call to schedule appt.  Lung Cancer Screening: (Low Dose CT Chest recommended if Age 81-80 years, 30 pack-year currently smoking OR have quit w/in 15years.) does not qualify.   Lung Cancer Screening Referral: No  Additional  Screening:  Hepatitis C Screening: does not qualify; Completed Not done  Vision Screening: Recommended annual ophthalmology exams for early detection of glaucoma and other disorders of the eye. Is the patient up to date with their annual eye exam?  Yes  Who is the provider or what is the name of the office in which the patient attends annual eye exams?  [facility] in [place], [place], [place] If pt is not established with a provider, would they like to be referred to a provider to establish care? No .   Dental Screening: Recommended annual dental exams for proper oral hygiene  Community Resource Referral / Chronic Care Management: CRR required this visit?  No   CCM required this visit?  No      Plan:     I have personally reviewed and noted the following in the patient's chart:   Medical and social history Use of alcohol, tobacco or illicit drugs  Current medications and supplements including opioid prescriptions. Patient is not currently taking opioid prescriptions. Functional ability and status Nutritional status Physical activity Advanced directives List of other physicians Hospitalizations, surgeries, and ER visits in previous 12 months Vitals Screenings to include cognitive, depression, and falls Referrals and appointments  In addition, I have reviewed and discussed with patient certain preventive protocols, quality metrics, and best practice recommendations. A written personalized care plan for preventive services as well as general preventive health recommendations were provided to patient.     Larell Baney Medina-Vargas, NP   02/20/2023   Nurse Notes:  does not want COVID vaccine nor pneumonia vaccine.

## 2023-02-20 NOTE — Patient Instructions (Signed)
  Krista Dunn , Thank you for taking time to come for your Medicare Wellness Visit. I appreciate your ongoing commitment to your health goals. Please review the following plan we discussed and let me know if I can assist you in the future.   These are the goals we discussed:  Goals      Exercise 3x per week (30 min per time)     - will join a gym - [employer], bought     Weight (lb) < 200 lb (90.7 kg)     Patient would like to get back to the gym.   Decrease back pain        This is a list of the screening recommended for you and due dates:  Health Maintenance  Topic Date Due   DTaP/Tdap/Td vaccine (1 - Tdap) Never done   Pneumonia Vaccine (1 of 1 - PCV) 11/12/2023*   Flu Shot  06/12/2023   Medicare Annual Wellness Visit  02/20/2024   DEXA scan (bone density measurement)  Completed   HPV Vaccine  Aged Out   COVID-19 Vaccine  Discontinued   Zoster (Shingles) Vaccine  Discontinued  *Topic was postponed. The date shown is not the original due date.

## 2023-02-25 ENCOUNTER — Ambulatory Visit: Payer: Medicare Other | Admitting: Podiatry

## 2023-03-13 ENCOUNTER — Encounter: Payer: Self-pay | Admitting: Podiatry

## 2023-03-13 ENCOUNTER — Ambulatory Visit (INDEPENDENT_AMBULATORY_CARE_PROVIDER_SITE_OTHER): Payer: Medicare Other | Admitting: Podiatry

## 2023-03-13 DIAGNOSIS — M778 Other enthesopathies, not elsewhere classified: Secondary | ICD-10-CM

## 2023-03-13 MED ORDER — DEXAMETHASONE SODIUM PHOSPHATE 120 MG/30ML IJ SOLN
2.0000 mg | Freq: Once | INTRAMUSCULAR | Status: AC
  Administered 2023-03-13: 2 mg via INTRA_ARTICULAR

## 2023-03-13 NOTE — Progress Notes (Signed)
She presents today for follow-up of her hallux limitus and capsulitis first metatarsophalangeal joint of her left foot.  She states that she has been doing well with this.  Objective: Vital signs stable alert oriented x 3.  Has pain on end range of motion first metatarsophalangeal joint.  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  Plan: Discussed etiology pathology conservative surgical therapies after Betadine skin prep I injected the first metatarsal phalangeal joint with 4 mg dexamethasone.  She tolerated procedure well and I will follow-up with her in 2 months

## 2023-04-24 ENCOUNTER — Ambulatory Visit: Payer: Medicare Other | Admitting: Orthopedic Surgery

## 2023-05-01 ENCOUNTER — Ambulatory Visit: Payer: Medicare Other | Admitting: Orthopedic Surgery

## 2023-05-22 ENCOUNTER — Ambulatory Visit: Payer: Medicare Other | Admitting: Orthopedic Surgery

## 2023-05-22 ENCOUNTER — Encounter: Payer: Self-pay | Admitting: Orthopedic Surgery

## 2023-05-22 VITALS — BP 138/80 | HR 74 | Temp 96.9°F | Resp 16 | Ht 66.14 in | Wt 163.0 lb

## 2023-05-22 DIAGNOSIS — E039 Hypothyroidism, unspecified: Secondary | ICD-10-CM

## 2023-05-22 DIAGNOSIS — R42 Dizziness and giddiness: Secondary | ICD-10-CM | POA: Diagnosis not present

## 2023-05-22 DIAGNOSIS — G4700 Insomnia, unspecified: Secondary | ICD-10-CM

## 2023-05-22 DIAGNOSIS — D329 Benign neoplasm of meninges, unspecified: Secondary | ICD-10-CM | POA: Diagnosis not present

## 2023-05-22 DIAGNOSIS — R748 Abnormal levels of other serum enzymes: Secondary | ICD-10-CM

## 2023-05-22 NOTE — Progress Notes (Signed)
Careteam: Patient Care Team: Krista Heir, NP as PCP - General (Adult Health Nurse Practitioner) Van Clines, MD as Consulting Physician (Neurology)  Seen by: Hazle Nordmann, AGNP-C  PLACE OF SERVICE:  [facility]  Advanced Directive information Does Patient Have a Medical Advance Directive?: Yes, Type of Advance Directive: Healthcare Power of [place];Living will, Does patient want to make changes to medical advance directive?: No - Patient declined  Allergies  Allergen Reactions   Epinephrine     REACTION: rapid heart rate   Procaine Hcl     REACTION: rapid heart rate, heart skips beat    Chief Complaint  Patient presents with   Medical Management of Chronic Issues    3 month follow up.    Immunizations    Discuss the need for DTAP vaccine.     HPI: Patient is a 82 y.o. female seen today for medical management of chronic conditions.   No health concerns.   BP initially elevated> upset with running late to appointment. Rechecked normal.   Continues to have dizziness. H/o dizziness > 3 years. Symptoms begin when she wakes up. Reports turning her head a certain way and it resolves. 02/2023 another provider prescribed meclizine, but she has not tried. Admits to not drinking enough water. She does have meningioma> first noticed 2014. She has not seen Dr.  Lions since 2021. Advised to try meclizine first and if no relief, consider seeing neuro.   Left knee steroid injection done yesterday by Dr. Berline Chough. Pain has improved since last injection. No recent falls or injuries.   Scheduled with [facility] for mammogram and DEXA 07/2023.    Review of Systems:  Review of Systems  Constitutional: Negative.   HENT:  Positive for tinnitus.   Eyes: Negative.   Respiratory: Negative.    Cardiovascular: Negative.   Gastrointestinal: Negative.   Genitourinary: Negative.   Musculoskeletal:  Positive for joint pain. Negative for falls.  Skin: Negative.   Neurological:  Positive for  dizziness. Negative for weakness.  Psychiatric/Behavioral:  Positive for memory loss. Negative for depression. The patient does not have insomnia.     Past Medical History:  Diagnosis Date   Allergy    dilantin =rash   Arthritis    back   Breast cancer (HCC) 09/13/2011   Complication of anesthesia    hard to wake up-goes out fast   Liver hemangioma    been follow on CT scan since 2008   Neck pain    Thyroid disease    Past Surgical History:  Procedure Laterality Date   ABDOMINAL HYSTERECTOMY     BREAST SURGERY  1988   rt br bx-negative   COLONOSCOPY     LYMPH NODE BIOPSY  10/14/2011   Procedure: LYMPH NODE BIOPSY;  Surgeon: Mariella Saa, MD;  Location: [facility];  Service: General;  Laterality: Right;   right foot surgery     toe implant   Social History:   reports that she has never smoked. She has never used smokeless tobacco. She reports current alcohol use of about 5.0 standard drinks of alcohol per week. She reports that she does not use drugs.  Family History  Problem Relation Age of Onset   Cancer Mother 82       pancreatic cancer   Cancer Father 38       colon cancer   Cancer Sister 24       breast cancer   Cancer Sister    Cancer  Maternal Grandmother 60       brain cancer   Cancer Paternal Grandmother        Brain   Cancer Paternal Aunt        breast   Breast cancer Daughter    Liver disease Son    Alcoholism Son     Medications: Patient's Medications  New Prescriptions   No medications on file  Previous Medications   CALCIUM CARBONATE (OSCAL) 1500 (600 CA) MG TABS TABLET    Take by mouth 2 (two) times daily with a meal.   LEVOTHYROXINE (SYNTHROID) 88 MCG TABLET    TAKE 1 TABLET(88 MCG) BY MOUTH DAILY   MECLIZINE (ANTIVERT) 25 MG TABLET    Take 1 tablet (25 mg total) by mouth 3 (three) times daily as needed for dizziness.   NUTRITIONAL SUPPLEMENTS (WELLNESS ESSENTIALS PO)    Take 1 capsule by mouth daily. Essential 1 with Q-10   Modified Medications   No medications on file  Discontinued Medications   No medications on file    Physical Exam:  Vitals:   05/22/23 0949  BP: (!) 140/88  Pulse: 74  Resp: 16  Temp: (!) 96.9 F (36.1 C)  SpO2: 98%  Weight: 163 lb (73.9 kg)  Height: 5' 6.14" (1.68 m)   Body mass index is 26.2 kg/m. Wt Readings from Last 3 Encounters:  05/22/23 163 lb (73.9 kg)  02/20/23 167 lb 6.4 oz (75.9 kg)  01/23/23 171 lb (77.6 kg)    Physical Exam Vitals reviewed.  Constitutional:      General: She is not in acute distress. HENT:     Head: Normocephalic.  Eyes:     General:        Right eye: No discharge.        Left eye: No discharge.  Neck:     Vascular: No carotid bruit.  Cardiovascular:     Rate and Rhythm: Normal rate and regular rhythm.     Pulses: Normal pulses.     Heart sounds: Normal heart sounds.  Pulmonary:     Effort: Pulmonary effort is normal. No respiratory distress.     Breath sounds: Normal breath sounds. No wheezing.  Abdominal:     General: Bowel sounds are normal.  Musculoskeletal:     Cervical back: Neck supple.     Right lower leg: No edema.     Left lower leg: No edema.  Lymphadenopathy:     Cervical: No cervical adenopathy.  Skin:    General: Skin is warm.     Capillary Refill: Capillary refill takes less than 2 seconds.  Neurological:     General: No focal deficit present.     Mental Status: She is alert and oriented to person, place, and time.     Motor: No weakness.     Gait: Gait normal.  Psychiatric:        Mood and Affect: Mood normal.     Labs reviewed: Basic Metabolic Panel: Recent Labs    01/23/23 1330  NA 141  K 4.0  CL 106  CO2 24  GLUCOSE 110*  BUN 12  CREATININE 0.70  CALCIUM 10.0  TSH 1.47   Liver Function Tests: Recent Labs    01/23/23 1330  AST 33  ALT 29  BILITOT 1.3*  PROT 7.0   No results for input(s): "LIPASE", "AMYLASE" in the last 8760 hours. No results for input(s): "AMMONIA" in the  last 8760 hours. CBC: Recent Labs  01/23/23 1330  WBC 4.3  NEUTROABS 2,468  HGB 13.3  HCT 40.4  MCV 90.2  PLT 231   Lipid Panel: No results for input(s): "CHOL", "HDL", "LDLCALC", "TRIG", "CHOLHDL", "LDLDIRECT" in the last 8760 hours. TSH: Recent Labs    01/23/23 1330  TSH 1.47   A1C: No results found for: "HGBA1C"   Assessment/Plan 1. Acquired hypothyroidism - TSH stable - cont levothyroxine  2. Meningioma [facility]) - followed by neurology - MRI brain 2021 unchanged from 2014 - c/o dizziness in AM  3. Elevated liver enzymes - AST/ALT 33/29 (03/14)> was 45/41 (04/2022) - admits to drinking a few alcoholic beverages weekly - Complete Metabolic Panel with eGFR; Future  4. Dizziness - see above - encourage hydration with water - advised to try meclizine   5. Insomnia, unspecified type - discussed melatonin at bedtime prn - avoid caffeine in evening    Total time: 34 minutes. Greater than 50% of total time spent doing patient education regarding health maintenance, hypothyroidism, elevated liver enzymes, and dizziness including symptom/medication management.     Next appt: Visit date not found  Camron Monday Scherry Ran  [facility] [phone]

## 2023-05-22 NOTE — Patient Instructions (Addendum)
Please schedule bone density  Recommend trying Meclizine for dizziness> new prescription was sent 02/2023  Recommend melatonin for sleeping aid> may purchase over the counter> start with 3 mg or 5 mg   Schedule lab visit within next month to check liver enzyme level  Recommend scheduling with Dr. Lysle Rubens if dizziness does not improve  Please get tetanus (Tdap) vaccine at local pharmacy

## 2023-06-06 ENCOUNTER — Other Ambulatory Visit: Payer: Medicare Other

## 2023-06-06 DIAGNOSIS — R748 Abnormal levels of other serum enzymes: Secondary | ICD-10-CM

## 2023-06-12 ENCOUNTER — Ambulatory Visit (INDEPENDENT_AMBULATORY_CARE_PROVIDER_SITE_OTHER): Payer: Medicare Other | Admitting: Podiatry

## 2023-06-12 DIAGNOSIS — M19072 Primary osteoarthritis, left ankle and foot: Secondary | ICD-10-CM | POA: Diagnosis not present

## 2023-06-12 MED ORDER — DEXAMETHASONE SODIUM PHOSPHATE 120 MG/30ML IJ SOLN
2.0000 mg | Freq: Once | INTRAMUSCULAR | Status: AC
  Administered 2023-06-12: 2 mg via INTRA_ARTICULAR

## 2023-06-14 NOTE — Progress Notes (Signed)
She presents today states that her left foot is the one is hurting now the right was doing pretty well so she does not need a shot and now on.  Objective: Vital signs are stable alert and oriented x 3.  Pulses are palpable.  She has limited range of motion with mild hallux valgus deformity hallux limitus of the first metatarsophalangeal joint of the left foot and pain.  Assessment: Pain limb secondary to capsulitis and hallux limitus left foot.  Plan: I injected the joint today intra-articularly with Kenalog and local anesthetic.  Tolerated procedure well without complications we will follow-up with me on an as-needed basis.

## 2023-06-27 ENCOUNTER — Other Ambulatory Visit: Payer: Self-pay | Admitting: Sports Medicine

## 2023-06-27 DIAGNOSIS — G8929 Other chronic pain: Secondary | ICD-10-CM

## 2023-07-02 ENCOUNTER — Ambulatory Visit: Admission: RE | Admit: 2023-07-02 | Payer: Medicare Other | Source: Ambulatory Visit

## 2023-07-02 ENCOUNTER — Other Ambulatory Visit: Payer: Self-pay | Admitting: Sports Medicine

## 2023-07-02 DIAGNOSIS — G8929 Other chronic pain: Secondary | ICD-10-CM

## 2023-08-14 ENCOUNTER — Telehealth: Payer: Medicare Other

## 2023-08-14 ENCOUNTER — Other Ambulatory Visit: Payer: Self-pay | Admitting: Orthopedic Surgery

## 2023-08-14 NOTE — Telephone Encounter (Signed)
Patient states she was suppose to have a mammogram done. Patient states they cancelled right before because they do not take her insurance because it would be 400 dollars out of pocket. She would need another referral. She said she used to go to [facility] and was wondering could she get the new referral sent there.

## 2023-08-14 NOTE — Telephone Encounter (Signed)
Can we find out which imaging office she spoke to? I do not want to make repeat referral.

## 2023-08-14 NOTE — Telephone Encounter (Signed)
Called to see what imaging patient went too. LVM for patient io call back

## 2023-08-30 ENCOUNTER — Other Ambulatory Visit: Payer: Self-pay | Admitting: Orthopedic Surgery

## 2023-08-30 DIAGNOSIS — E039 Hypothyroidism, unspecified: Secondary | ICD-10-CM

## 2023-09-16 ENCOUNTER — Ambulatory Visit: Payer: Medicare Other | Admitting: Podiatry

## 2023-11-27 ENCOUNTER — Ambulatory Visit: Payer: Medicare Other | Admitting: Orthopedic Surgery

## 2023-12-04 ENCOUNTER — Ambulatory Visit: Payer: Medicare Other | Admitting: Orthopedic Surgery

## 2024-02-05 ENCOUNTER — Other Ambulatory Visit (HOSPITAL_BASED_OUTPATIENT_CLINIC_OR_DEPARTMENT_OTHER): Payer: Self-pay | Admitting: Orthopedic Surgery

## 2024-02-05 DIAGNOSIS — Z1231 Encounter for screening mammogram for malignant neoplasm of breast: Secondary | ICD-10-CM

## 2024-02-10 ENCOUNTER — Ambulatory Visit (HOSPITAL_BASED_OUTPATIENT_CLINIC_OR_DEPARTMENT_OTHER)
Admission: RE | Admit: 2024-02-10 | Discharge: 2024-02-10 | Disposition: A | Discharge status: Home / Self Care | Source: Ambulatory Visit | Attending: Orthopedic Surgery | Admitting: Orthopedic Surgery

## 2024-02-10 ENCOUNTER — Encounter (HOSPITAL_BASED_OUTPATIENT_CLINIC_OR_DEPARTMENT_OTHER): Payer: Self-pay | Admitting: Radiology

## 2024-02-10 DIAGNOSIS — Z1231 Encounter for screening mammogram for malignant neoplasm of breast: Secondary | ICD-10-CM | POA: Diagnosis present

## 2024-02-17 ENCOUNTER — Encounter: Payer: Self-pay | Admitting: Orthopedic Surgery

## 2024-02-23 ENCOUNTER — Encounter: Payer: Medicare Other | Admitting: Adult Health

## 2024-02-24 ENCOUNTER — Other Ambulatory Visit: Payer: Self-pay | Admitting: Orthopedic Surgery

## 2024-02-24 DIAGNOSIS — E039 Hypothyroidism, unspecified: Secondary | ICD-10-CM

## 2024-03-04 ENCOUNTER — Ambulatory Visit: Payer: Medicare Other | Admitting: Orthopedic Surgery

## 2024-03-04 ENCOUNTER — Encounter: Payer: Self-pay | Admitting: Orthopedic Surgery

## 2024-03-04 VITALS — BP 130/70 | HR 71 | Ht 66.5 in | Wt 158.0 lb

## 2024-03-04 DIAGNOSIS — Z0000 Encounter for general adult medical examination without abnormal findings: Secondary | ICD-10-CM | POA: Diagnosis not present

## 2024-03-04 NOTE — Progress Notes (Signed)
 Subjective:   Krista Dunn is a 83 y.o. female who presents for Medicare Annual (Subsequent) preventive examination.  Visit Complete: In person  Patient Medicare AWV questionnaire was completed by the patient on 03/04/2024; I have confirmed that all information answered by patient is correct and no changes since this date.  Cardiac Risk Factors include: advanced age (>66men, >85 women)     Objective:    Today's Vitals   03/04/24 1409  BP: 130/70  Pulse: 71  Weight: 158 lb (71.7 kg)  Height: 5' 6.5" (1.689 m)   Body mass index is 25.12 kg/m.     05/22/2023    9:51 AM 02/20/2023    1:06 PM 01/23/2023   12:55 PM 04/18/2022    1:05 PM 06/07/2021   10:24 AM 08/10/2020   12:45 PM 07/31/2020    9:51 AM  Advanced Directives  Does Patient Have a Medical Advance Directive? Yes Yes Yes Yes Yes Yes Yes  Type of Estate agent of [place];Living will Living will Living will Living will Living will Living will Living will  Does patient want to make changes to medical advance directive? No - Patient declined No - Patient declined No - Patient declined No - Patient declined No - Patient declined    Copy of Healthcare Power of Attorney in Chart? No - copy requested          Current Medications (verified) Outpatient Encounter Medications as of 03/04/2024  Medication Sig   calcium  carbonate (OSCAL) 1500 (600 Ca) MG TABS tablet Take by mouth 2 (two) times daily with a meal.   levothyroxine  (SYNTHROID ) 88 MCG tablet TAKE 1 TABLET(88 MCG) BY MOUTH DAILY   meclizine  (ANTIVERT ) 25 MG tablet Take 1 tablet (25 mg total) by mouth 3 (three) times daily as needed for dizziness.   Nutritional Supplements (WELLNESS ESSENTIALS PO) Take 1 capsule by mouth daily. Essential 1 with Q-10   No facility-administered encounter medications on file as of 03/04/2024.    Allergies (verified) Epinephrine and Procaine hcl   History: Past Medical History:  Diagnosis Date   Allergy     dilantin =rash   Arthritis    back   Breast cancer (HCC) 09/13/2011   Complication of anesthesia    hard to wake up-goes out fast   Liver hemangioma    been follow on CT scan since 2008   Neck pain    Thyroid  disease    Past Surgical History:  Procedure Laterality Date   ABDOMINAL HYSTERECTOMY     BREAST SURGERY  1988   rt br bx-negative   COLONOSCOPY     LYMPH NODE BIOPSY  10/14/2011   Procedure: LYMPH NODE BIOPSY;  Surgeon: Quitman Bucy, MD;  Location: [facility];  Service: General;  Laterality: Right;   right foot surgery     toe implant   Family History  Problem Relation Age of Onset   Cancer Mother 78       pancreatic cancer   Cancer Father 9       colon cancer   Cancer Sister 38       breast cancer   Cancer Sister    Cancer Maternal Grandmother 60       brain cancer   Cancer Paternal Grandmother        Brain   Cancer Paternal Aunt        breast   Breast cancer Daughter    Liver disease Son    Alcoholism  Son    Social History   Socioeconomic History   Marital status: Married    Spouse name: Not on file   Number of children: 2   Years of education: Not on file   Highest education level: 12th grade  Occupational History   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    Employer: RETIRED  Tobacco Use   Smoking status: Never   Smokeless tobacco: Never  Vaping Use   Vaping status: Never Used  Substance and Sexual Activity   Alcohol use: Yes    Alcohol/week: 5.0 standard drinks of alcohol    Types: 5 drink(s) per week   Drug use: No   Sexual activity: Yes  Other Topics Concern   Not on file  Social History Narrative   Right handed    Lives with husband    Social Drivers of Health   Financial Resource Strain: Low Risk  (03/03/2024)   Overall Financial Resource Strain (CARDIA)    Difficulty of Paying Living Expenses: Not very hard  Food Insecurity: No Food Insecurity (03/03/2024)   Hunger Vital Sign    Worried About Running  Out of Food in the Last Year: Never true    Ran Out of Food in the Last Year: Never true  Transportation Needs: No Transportation Needs (03/03/2024)   PRAPARE - Administrator, Civil Service (Medical): No    Lack of Transportation (Non-Medical): No  Physical Activity: Inactive (03/04/2024)   Exercise Vital Sign    Days of Exercise per Week: 0 days    Minutes of Exercise per Session: 0 min  Stress: Stress Concern Present (03/03/2024)   [employer] of Occupational Health - Occupational Stress Questionnaire    Feeling of Stress : Rather much  Social Connections: Moderately Integrated (03/04/2024)   Social Connection and Isolation Panel [NHANES]    Frequency of Communication with Friends and Family: Twice a week    Frequency of Social Gatherings with Friends and Family: Once a week    Attends Religious Services: 1 to 4 times per year    Active Member of [employer] or Organizations: No    Attends Engineer, structural: Never    Marital Status: Married    Tobacco Counseling Counseling given: Not Answered   Clinical Intake:  Pre-visit preparation completed: Yes  Pain : No/denies pain     BMI - recorded: 208.33 Nutritional Status: BMI 25 -29 Overweight Nutritional Risks: None Diabetes: No  How often do you need to have someone help you when you read instructions, pamphlets, or other written materials from your doctor or pharmacy?: 1 - Never What is the last grade level you completed in school?: High school with some college classes  Interpreter Needed?: No      Activities of Daily Living    03/04/2024    2:20 PM 03/03/2024    2:01 PM  In your present state of health, do you have any difficulty performing the following activities:  Hearing? 0 0  Vision? 0 0  Difficulty concentrating or making decisions? 1 1  Walking or climbing stairs? 1 1  Dressing or bathing?  0  Doing errands, shopping? 0 0  Preparing Food and eating ? N N  Using the Toilet? N N   In the past six months, have you accidently leaked urine? N N  Do you have problems with loss of bowel control? N N  Managing your Medications? N N  Managing your Finances? N N  Housekeeping or managing your  Housekeeping? Colie Dawes    Patient Care Team: Arnetha Bhat, NP as PCP - General (Adult Health Nurse Practitioner) Jhonny Moss, MD as Consulting Physician (Neurology)  Indicate any recent Medical Services you may have received from other than [facility] providers in the past year (date may be approximate).     Assessment:   This is a routine wellness examination for [place].  Hearing/Vision screen No results found.   Goals Addressed             This Visit's Progress    Exercise 3x per week (30 min per time)   Not on track    - will join a gym - [employer], bought       Depression Screen    03/04/2024    2:05 PM 02/20/2023    1:06 PM 01/23/2023   12:50 PM 04/18/2022    1:42 PM 12/21/2020    2:48 PM 07/31/2020   10:06 AM 02/16/2020    3:25 PM  PHQ 2/9 Scores  PHQ - 2 Score 0 1 3 0 0 0 1  PHQ- 9 Score  4 8        Fall Risk    03/04/2024    2:21 PM 03/04/2024    2:04 PM 03/03/2024    2:01 PM 05/22/2023    9:50 AM 02/20/2023    1:06 PM  Fall Risk   Falls in the past year?  0 0 1 1  Number falls in past yr:  0 0 0 0  Injury with Fall?  0 1 1 1   Risk for fall due to : History of fall(s) No Fall Risks  History of fall(s) History of fall(s)  Follow up Falls evaluation completed Falls evaluation completed  Falls evaluation completed;Education provided;Falls prevention discussed     MEDICARE RISK AT HOME: Medicare Risk at Home Any stairs in or around the home?: (Patient-Rptd) Yes If so, are there any without handrails?: (Patient-Rptd) No Home free of loose throw rugs in walkways, pet beds, electrical cords, etc?: (Patient-Rptd) Yes Adequate lighting in your home to reduce risk of falls?: (Patient-Rptd) Yes Life alert?: (Patient-Rptd) No Use of a cane, walker or w/c?:  (Patient-Rptd) No Grab bars in the bathroom?: (Patient-Rptd) No Shower chair or bench in shower?: (Patient-Rptd) Yes Elevated toilet seat or a handicapped toilet?: (Patient-Rptd) No  TIMED UP AND GO:  Was the test performed?  No    Cognitive Function:    02/20/2023    1:08 PM 08/23/2021    3:28 PM  MMSE - Mini Mental State Exam  Orientation to time 4 5  Orientation to Place 5 5  Registration 3 3  Attention/ Calculation 5 5  Recall 3 3  Language- name 2 objects 2 2  Language- repeat 1 1  Language- follow 3 step command 3 3  Language- read & follow direction 1 1  Write a sentence 1 1  Copy design 0 1  Total score 28 30        03/04/2024    2:21 PM 07/31/2020   10:16 AM 03/24/2019   11:14 AM  6CIT Screen  What Year? 0 points 0 points 0 points  What month? 0 points 0 points 0 points  What time? 0 points 0 points 0 points  Count back from 20 0 points 0 points 0 points  Months in reverse 0 points 0 points 0 points  Repeat phrase 0 points 0 points 0 points  Total Score 0 points 0 points  0 points    Immunizations Immunization History  Administered Date(s) Administered   PFIZER(Purple Top)SARS-COV-2 Vaccination 12/01/2019, 12/17/2019    TDAP status: Due, Education has been provided regarding the importance of this vaccine. Advised may receive this vaccine at local pharmacy or Health Dept. Aware to provide a copy of the vaccination record if obtained from local pharmacy or Health Dept. Verbalized acceptance and understanding.  Flu Vaccine status: Declined, Education has been provided regarding the importance of this vaccine but patient still declined. Advised may receive this vaccine at local pharmacy or Health Dept. Aware to provide a copy of the vaccination record if obtained from local pharmacy or Health Dept. Verbalized acceptance and understanding.  Pneumococcal vaccine status: Due, Education has been provided regarding the importance of this vaccine. Advised may receive  this vaccine at local pharmacy or Health Dept. Aware to provide a copy of the vaccination record if obtained from local pharmacy or Health Dept. Verbalized acceptance and understanding.  Covid-19 vaccine status: Completed vaccines  Qualifies for Shingles Vaccine? Yes   Zostavax completed No   Shingrix Completed?: No.    Education has been provided regarding the importance of this vaccine. Patient has been advised to call insurance company to determine out of pocket expense if they have not yet received this vaccine. Advised may also receive vaccine at local pharmacy or Health Dept. Verbalized acceptance and understanding.  Screening Tests Health Maintenance  Topic Date Due   DTaP/Tdap/Td (1 - Tdap) Never done   Pneumonia Vaccine 23+ Years old (1 of 1 - PCV) Never done   INFLUENZA VACCINE  06/11/2024   Medicare Annual Wellness (AWV)  03/04/2025   DEXA SCAN  Completed   HPV VACCINES  Aged Out   Meningococcal B Vaccine  Aged Out   COVID-19 Vaccine  Discontinued   Zoster Vaccines- Shingrix  Discontinued    Health Maintenance  Health Maintenance Due  Topic Date Due   DTaP/Tdap/Td (1 - Tdap) Never done   Pneumonia Vaccine 55+ Years old (1 of 1 - PCV) Never done    Colorectal cancer screening: No longer required.   Mammogram status: No longer required due to 02/16/2024.  Bone Density status: Completed 2022. Results reflect: Bone density results: OSTEOPOROSIS. Repeat every 2 years.  Lung Cancer Screening: (Low Dose CT Chest recommended if Age 65-80 years, 20 pack-year currently smoking OR have quit w/in 15years.) does not qualify.   Lung Cancer Screening Referral: No  Additional Screening:  Hepatitis C Screening: does not qualify; Completed   Vision Screening: Recommended annual ophthalmology exams for early detection of glaucoma and other disorders of the eye. Is the patient up to date with their annual eye exam?  Yes  Who is the provider or what is the name of the office in  which the patient attends annual eye exams? Cannot recall If pt is not established with a provider, would they like to be referred to a provider to establish care? No .   Dental Screening: Recommended annual dental exams for proper oral hygiene  Diabetic Foot Exam: Diabetic Foot Exam: Completed 03/04/2024  Community Resource Referral / Chronic Care Management: CRR required this visit?  No   CCM required this visit?  No     Plan:     I have personally reviewed and noted the following in the patient's chart:   Medical and social history Use of alcohol, tobacco or illicit drugs  Current medications and supplements including opioid prescriptions. Patient is not currently taking opioid prescriptions.  Functional ability and status Nutritional status Physical activity Advanced directives List of other physicians Hospitalizations, surgeries, and ER visits in previous 12 months Vitals Screenings to include cognitive, depression, and falls Referrals and appointments  In addition, I have reviewed and discussed with patient certain preventive protocols, quality metrics, and best practice recommendations. A written personalized care plan for preventive services as well as general preventive health recommendations were provided to patient.     Arnetha Bhat, NP   03/04/2024   After Visit Summary: (MyChart) Due to this being a telephonic visit, the after visit summary with patients personalized plan was offered to patient via MyChart   Nurse Notes: CIT6 score 0. Needs prevnar 20 and Tdap vaccines.

## 2024-03-04 NOTE — Patient Instructions (Signed)
  Krista Dunn , Thank you for taking time to come for your Medicare Wellness Visit. I appreciate your ongoing commitment to your health goals. Please review the following plan we discussed and let me know if I can assist you in the future.   These are the goals we discussed:  Goals      Exercise 3x per week (30 min per time)     - will join a gym - [employer], bought     Weight (lb) < 200 lb (90.7 kg)     Patient would like to get back to the gym.   Decrease back pain        This is a list of the screening recommended for you and due dates:  Health Maintenance  Topic Date Due   DTaP/Tdap/Td vaccine (1 - Tdap) Never done   Pneumonia Vaccine (1 of 1 - PCV) Never done   Flu Shot  06/11/2024   Medicare Annual Wellness Visit  03/04/2025   DEXA scan (bone density measurement)  Completed   HPV Vaccine  Aged Out   Meningitis B Vaccine  Aged Out   COVID-19 Vaccine  Discontinued   Zoster (Shingles) Vaccine  Discontinued   Please consider getting Prevnar 20 pneumonia vaccine in future

## 2024-03-18 ENCOUNTER — Encounter: Payer: Self-pay | Admitting: Orthopedic Surgery

## 2024-03-18 ENCOUNTER — Ambulatory Visit (INDEPENDENT_AMBULATORY_CARE_PROVIDER_SITE_OTHER): Admitting: Orthopedic Surgery

## 2024-03-18 VITALS — BP 122/88 | HR 74 | Temp 97.8°F | Resp 16 | Ht 66.5 in | Wt 157.2 lb

## 2024-03-18 DIAGNOSIS — E7800 Pure hypercholesterolemia, unspecified: Secondary | ICD-10-CM

## 2024-03-18 DIAGNOSIS — Z853 Personal history of malignant neoplasm of breast: Secondary | ICD-10-CM

## 2024-03-18 DIAGNOSIS — R748 Abnormal levels of other serum enzymes: Secondary | ICD-10-CM

## 2024-03-18 DIAGNOSIS — G8929 Other chronic pain: Secondary | ICD-10-CM

## 2024-03-18 DIAGNOSIS — D329 Benign neoplasm of meninges, unspecified: Secondary | ICD-10-CM | POA: Diagnosis not present

## 2024-03-18 DIAGNOSIS — M25562 Pain in left knee: Secondary | ICD-10-CM | POA: Diagnosis not present

## 2024-03-18 DIAGNOSIS — E039 Hypothyroidism, unspecified: Secondary | ICD-10-CM

## 2024-03-18 DIAGNOSIS — R413 Other amnesia: Secondary | ICD-10-CM

## 2024-03-18 MED ORDER — PREDNISONE 20 MG PO TABS: ORAL_TABLET | ORAL | 0 refills | Status: AC

## 2024-03-18 NOTE — Patient Instructions (Addendum)
 Will try prednisone x 10 days to help with knee swelling> please take in AM  Recommend talking to Dr. Abigail Abler or Dr. Pryor Browning for knee surgery  Schedule with neurology> about memory and meningioma > you saw Dr. Vena Gibes with [facility]  Please schedule fasting lab work here> only water or black coffee only

## 2024-03-18 NOTE — Progress Notes (Signed)
 Careteam: Patient Care Team: Arnetha Bhat, NP as PCP - General (Adult Health Nurse Practitioner) Jhonny Moss, MD as Consulting Physician (Neurology)  Seen by: Ulyses Gandy, AGNP-C  PLACE OF SERVICE:  [facility]  Advanced Directive information    Allergies  Allergen Reactions   Epinephrine     REACTION: rapid heart rate   Procaine Hcl     REACTION: rapid heart rate, heart skips beat    No chief complaint on file.    HPI: Patient is a 83 y.o. female seen today for medical management of chronic conditions.   Discussed the use of AI scribe software for clinical note transcription with the patient, who gave verbal consent to proceed.  History of Present Illness    She has been experiencing persistent left knee pain for the past two to three months, particularly when sitting and upon standing, causing shooting pain at times. She is followed by [facility]. Recent steroid injection unsuccessful in relieving pain. 07/02/2023 x-ray of left knee noted joint space narrowing consistent with degenerative joint disease.She is taking various OTC supplements to aid in joint health, one has glucosamine and chondroitin. Admits to sometimes using voltaren  gel in past. No recent falls. Not using assistive device to ambulate.   H/o meningioma, dizziness and memory issues. She continues to have intermittent dizziness and memory issues. She was seen by neurology in past. 06/2020 MRI brain noted mild chronic small vessel ischemic changes to white matter and meningioma 3.7 x 3.0 x 3.2 cm> unchanged from 2014. MMSE 28/30 02/2023, 6CIT score 0 03/04/2024. She recalls previous neurocognitive testing and seeing neurology in past. She has been advised to schedule f/u appointment in past which she has neglected to do.   04/07 mammogram negative for malignancy. She reports some right breast firmness at times. Denies breast pain, lump or nipple discharge.   Refusing lab work today due to time  schedule. Advised to schedule lab visit.   Review of Systems:  Review of Systems  Constitutional: Negative.   HENT: Negative.    Eyes: Negative.   Respiratory: Negative.    Cardiovascular: Negative.   Gastrointestinal: Negative.   Genitourinary: Negative.   Musculoskeletal:  Positive for joint pain. Negative for falls.  Skin: Negative.   Neurological:  Positive for dizziness. Negative for weakness and headaches.  Psychiatric/Behavioral:  Positive for memory loss. Negative for depression. The patient is not nervous/anxious.     Past Medical History:  Diagnosis Date   Allergy    dilantin =rash   Arthritis    back   Breast cancer (HCC) 09/13/2011   Complication of anesthesia    hard to wake up-goes out fast   Liver hemangioma    been follow on CT scan since 2008   Neck pain    Thyroid  disease    Past Surgical History:  Procedure Laterality Date   ABDOMINAL HYSTERECTOMY     BREAST SURGERY  1988   rt br bx-negative   COLONOSCOPY     LYMPH NODE BIOPSY  10/14/2011   Procedure: LYMPH NODE BIOPSY;  Surgeon: Quitman Bucy, MD;  Location: [facility];  Service: General;  Laterality: Right;   right foot surgery     toe implant   Social History:   reports that she has never smoked. She has never used smokeless tobacco. She reports current alcohol use of about 5.0 standard drinks of alcohol per week. She reports that she does not use drugs.  Family  History  Problem Relation Age of Onset   Cancer Mother 90       pancreatic cancer   Cancer Father 30       colon cancer   Cancer Sister 1       breast cancer   Cancer Sister    Cancer Maternal Grandmother 65       brain cancer   Cancer Paternal Grandmother        Brain   Cancer Paternal Aunt        breast   Breast cancer Daughter    Liver disease Son    Alcoholism Son     Medications: Patient's Medications  New Prescriptions   No medications on file  Previous Medications   CALCIUM  CARBONATE (OSCAL)  1500 (600 CA) MG TABS TABLET    Take by mouth 2 (two) times daily with a meal.   LEVOTHYROXINE  (SYNTHROID ) 88 MCG TABLET    TAKE 1 TABLET(88 MCG) BY MOUTH DAILY   MECLIZINE  (ANTIVERT ) 25 MG TABLET    Take 1 tablet (25 mg total) by mouth 3 (three) times daily as needed for dizziness.   NUTRITIONAL SUPPLEMENTS (WELLNESS ESSENTIALS PO)    Take 1 capsule by mouth daily. Essential 1 with Q-10  Modified Medications   No medications on file  Discontinued Medications   No medications on file    Physical Exam:  Vitals:   03/18/24 1349  BP: 122/88  Pulse: 74  Resp: 16  Temp: 97.8 F (36.6 C)  SpO2: 98%  Weight: 157 lb 3.2 oz (71.3 kg)  Height: 5' 6.5" (1.689 m)   Body mass index is 24.99 kg/m. Wt Readings from Last 3 Encounters:  03/18/24 157 lb 3.2 oz (71.3 kg)  03/04/24 158 lb (71.7 kg)  05/22/23 163 lb (73.9 kg)    Physical Exam Vitals reviewed.  Constitutional:      General: She is not in acute distress. HENT:     Head: Normocephalic.     Right Ear: There is no impacted cerumen.     Left Ear: There is no impacted cerumen.     Nose: Nose normal.     Mouth/Throat:     Mouth: Mucous membranes are moist.  Eyes:     General:        Right eye: No discharge.        Left eye: No discharge.  Neck:     Thyroid : No thyroid  mass or thyromegaly.  Cardiovascular:     Rate and Rhythm: Normal rate and regular rhythm.     Pulses: Normal pulses.     Heart sounds: Normal heart sounds.  Pulmonary:     Effort: Pulmonary effort is normal.     Breath sounds: Normal breath sounds.  Chest:  Breasts:    Right: No swelling, mass, skin change or tenderness.     Left: No swelling, mass, skin change or tenderness.  Abdominal:     General: Bowel sounds are normal.     Palpations: Abdomen is soft.  Musculoskeletal:     Cervical back: Neck supple.     Right lower leg: No edema.     Left lower leg: No edema.  Skin:    General: Skin is warm.     Capillary Refill: Capillary refill takes  less than 2 seconds.  Neurological:     General: No focal deficit present.     Mental Status: She is alert and oriented to person, place, and time.     Motor:  No weakness.     Gait: Gait normal.  Psychiatric:        Mood and Affect: Mood normal.     Labs reviewed: Basic Metabolic Panel: Recent Labs    06/06/23 1446  NA 140  K 3.9  CL 103  CO2 24  GLUCOSE 99  BUN 15  CREATININE 0.87  CALCIUM  10.3   Liver Function Tests: Recent Labs    06/06/23 1446  AST 39*  ALT 42*  BILITOT 1.4*  PROT 7.1   No results for input(s): "LIPASE", "AMYLASE" in the last 8760 hours. No results for input(s): "AMMONIA" in the last 8760 hours. CBC: No results for input(s): "WBC", "NEUTROABS", "HGB", "HCT", "MCV", "PLT" in the last 8760 hours. Lipid Panel: No results for input(s): "CHOL", "HDL", "LDLCALC", "TRIG", "CHOLHDL", "LDLDIRECT" in the last 8760 hours. TSH: No results for input(s): "TSH" in the last 8760 hours. A1C: No results found for: "HGBA1C"   Assessment/Plan 1. Chronic pain of left knee (Primary) - ongoing - followed by Ruthanna Covert - 06/2023 xray noted joint space narrowing - last steroid injection unsuccessful reducing pain - no recent falls - not ambulating with assistive device - advised to return to ortho for treatment options  - cont tylenol  and voltaren  gel prn - predniSONE  (DELTASONE ) 20 MG tablet; Take 1 tablet (20 mg total) by mouth daily with breakfast for 7 days, THEN 0.5 tablets (10 mg total) daily with breakfast for 3 days.  Dispense: 8.5 tablet; Refill: 0 - CBC with Differential/Platelet; Future - Complete Metabolic Panel with eGFR; Future  2. Acquired hypothyroidism - TSH 1.47 01/23/2023 - some weight gain  - cont levothyroxine  - TSH; Future  3. Meningioma (HCC) - followed by neurology> not seen since 2021 - MRI brain 2021 unchanged from 2014 - c/o dizziness in AM  - recommend scheduling with neurology> may need re-referral  4. Elevated  liver enzymes - AST/ALT 33/29 01/23/2023  5. Memory impairment - ongoing - 6CIT score 0 02/2024 - MMSE 28/30 02/2023 - MRI brain noted microvascular changes 06/2020 - recommend neurology f/u  6. Elevated cholesterol - LDL 136 12/2020 - not on medication - Lipid Panel; Future  7. History of breast cancer - noted 09/13/2011 - h/o breast surgery 1988 - 04/07 mammogram negative for malignancy - reports right upper breast firmness at times - not detected on exam today  - advised to report any breast changes to PCP    Total time: 41 minutes. Greater than 50% of total time spent doing patient education regarding health maintenance, knee pain, meningioma, memory impairment, and hypothyroidism including symptom/medication management.    Next appt: Visit date not found  Arminta Gamm Darral Ellis  [facility] [phone]

## 2024-03-24 ENCOUNTER — Other Ambulatory Visit

## 2024-03-24 DIAGNOSIS — E7800 Pure hypercholesterolemia, unspecified: Secondary | ICD-10-CM

## 2024-03-24 DIAGNOSIS — E039 Hypothyroidism, unspecified: Secondary | ICD-10-CM

## 2024-03-24 DIAGNOSIS — G8929 Other chronic pain: Secondary | ICD-10-CM

## 2024-03-24 LAB — CBC WITH DIFFERENTIAL/PLATELET
Absolute Lymphocytes: 1579 {cells}/uL (ref 850–3900)
Absolute Monocytes: 377 {cells}/uL (ref 200–950)
Basophils Absolute: 21 {cells}/uL (ref 0–200)
Basophils Relative: 0.5 %
Eosinophils Absolute: 148 {cells}/uL (ref 15–500)
Eosinophils Relative: 3.6 %
HCT: 39.1 % (ref 35.0–45.0)
Hemoglobin: 12.8 g/dL (ref 11.7–15.5)
MCH: 29.6 pg (ref 27.0–33.0)
MCHC: 32.7 g/dL (ref 32.0–36.0)
MCV: 90.3 fL (ref 80.0–100.0)
MPV: 12 fL (ref 7.5–12.5)
Monocytes Relative: 9.2 %
Neutro Abs: 1976 {cells}/uL (ref 1500–7800)
Neutrophils Relative %: 48.2 %
Platelets: 231 10*3/uL (ref 140–400)
RBC: 4.33 10*6/uL (ref 3.80–5.10)
RDW: 13.5 % (ref 11.0–15.0)
Total Lymphocyte: 38.5 %
WBC: 4.1 10*3/uL (ref 3.8–10.8)

## 2024-03-24 LAB — COMPLETE METABOLIC PANEL WITHOUT GFR
AG Ratio: 1.9 (calc) (ref 1.0–2.5)
ALT: 26 U/L (ref 6–29)
AST: 32 U/L (ref 10–35)
Albumin: 4.8 g/dL (ref 3.6–5.1)
Alkaline phosphatase (APISO): 87 U/L (ref 37–153)
BUN: 14 mg/dL (ref 7–25)
CO2: 27 mmol/L (ref 20–32)
Calcium: 10.1 mg/dL (ref 8.6–10.4)
Chloride: 105 mmol/L (ref 98–110)
Creat: 0.73 mg/dL (ref 0.60–0.95)
Globulin: 2.5 g/dL (ref 1.9–3.7)
Glucose, Bld: 107 mg/dL — ABNORMAL HIGH (ref 65–99)
Potassium: 4.2 mmol/L (ref 3.5–5.3)
Sodium: 141 mmol/L (ref 135–146)
Total Bilirubin: 1.4 mg/dL — ABNORMAL HIGH (ref 0.2–1.2)
Total Protein: 7.3 g/dL (ref 6.1–8.1)

## 2024-03-24 LAB — LIPID PANEL
Cholesterol: 224 mg/dL — ABNORMAL HIGH (ref <200)
HDL: 79 mg/dL (ref >=50)
LDL Cholesterol (Calc): 123 mg/dL — ABNORMAL HIGH
Non-HDL Cholesterol (Calc): 145 mg/dL — ABNORMAL HIGH (ref <130)
Total CHOL/HDL Ratio: 2.8 (calc) (ref <5.0)
Triglycerides: 116 mg/dL (ref <150)

## 2024-03-24 LAB — TSH: TSH: 0.6 m[IU]/L (ref 0.40–4.50)

## 2024-03-25 ENCOUNTER — Ambulatory Visit: Payer: Self-pay | Admitting: Orthopedic Surgery

## 2024-08-29 ENCOUNTER — Other Ambulatory Visit: Payer: Self-pay | Admitting: Orthopedic Surgery

## 2024-08-29 DIAGNOSIS — E039 Hypothyroidism, unspecified: Secondary | ICD-10-CM

## 2024-09-23 ENCOUNTER — Ambulatory Visit: Payer: Self-pay | Admitting: Orthopedic Surgery

## 2024-11-09 ENCOUNTER — Emergency Department (HOSPITAL_BASED_OUTPATIENT_CLINIC_OR_DEPARTMENT_OTHER)
Admission: EM | Admit: 2024-11-09 | Discharge: 2024-11-09 | Disposition: A | Discharge status: Home / Self Care | Attending: Emergency Medicine | Admitting: Emergency Medicine

## 2024-11-09 ENCOUNTER — Other Ambulatory Visit: Payer: Self-pay

## 2024-11-09 ENCOUNTER — Emergency Department (HOSPITAL_BASED_OUTPATIENT_CLINIC_OR_DEPARTMENT_OTHER): Admitting: Radiology

## 2024-11-09 ENCOUNTER — Other Ambulatory Visit (HOSPITAL_BASED_OUTPATIENT_CLINIC_OR_DEPARTMENT_OTHER): Payer: Self-pay

## 2024-11-09 DIAGNOSIS — M7989 Other specified soft tissue disorders: Secondary | ICD-10-CM | POA: Diagnosis not present

## 2024-11-09 DIAGNOSIS — W01198A Fall on same level from slipping, tripping and stumbling with subsequent striking against other object, initial encounter: Secondary | ICD-10-CM | POA: Diagnosis not present

## 2024-11-09 DIAGNOSIS — S6992XA Unspecified injury of left wrist, hand and finger(s), initial encounter: Secondary | ICD-10-CM | POA: Diagnosis present

## 2024-11-09 DIAGNOSIS — S52502A Unspecified fracture of the lower end of left radius, initial encounter for closed fracture: Secondary | ICD-10-CM | POA: Diagnosis not present

## 2024-11-09 MED ORDER — OXYCODONE-ACETAMINOPHEN 5-325 MG PO TABS
1.0000 | ORAL_TABLET | Freq: Four times a day (QID) | ORAL | 0 refills | Status: AC | PRN
  Filled 2024-11-09: qty 12, 3d supply, fill #0

## 2024-11-09 MED ORDER — OXYCODONE-ACETAMINOPHEN 5-325 MG PO TABS
1.0000 | ORAL_TABLET | Freq: Once | ORAL | Status: AC
  Administered 2024-11-09: 1 via ORAL
  Filled 2024-11-09: qty 1

## 2024-11-09 NOTE — Discharge Instructions (Signed)
 It was a please taking care of you today.  You have a broken bone in your wrist  Call the hand surgeon listed on your discharge paperwork to schedule an appointment.  You were given a prescription for pain medication. This is an opiate medication, does have the addictive potential. Do not drive or operate heavy machinery while taking this medication.  Return for new or worsening symptoms

## 2024-11-09 NOTE — ED Provider Notes (Signed)
 " [place] EMERGENCY DEPARTMENT AT [facility] Provider Note   CSN: [id number] Arrival date & time: 11/09/24  1120     Patient presents with: Krista Dunn DELENA Krista Dunn is a 83 y.o. female here for evaluation of fall. Hit left arm on exercise machine. Pain to left wrist and forearm. Denies hitting head, LOC, anticoagulation. No CP, SOB, abd pain, numbness or weakness. No neck or back pain.  RHD    HPI     Prior to Admission medications  Medication Sig Start Date End Date Taking? Authorizing Provider  oxyCODONE -acetaminophen  (PERCOCET/ROXICET) 5-325 MG tablet Take 1 tablet by mouth every 6 (six) hours as needed for severe pain (pain score 7-10). 11/09/24  Yes Bodi Palmeri A, PA-C  calcium  carbonate (OSCAL) 1500 (600 Ca) MG TABS tablet Take by mouth 2 (two) times daily with a meal.    [provider]  levothyroxine  (SYNTHROID ) 88 MCG tablet TAKE 1 TABLET(88 MCG) BY MOUTH DAILY 08/30/24   Fargo, Amy E, NP  meclizine  (ANTIVERT ) 25 MG tablet Take 1 tablet (25 mg total) by mouth 3 (three) times daily as needed for dizziness. 02/20/23   Medina-Vargas, Monina C, NP  OVER THE COUNTER MEDICATION Take 1 capsule by mouth every other day. ANDREW LESSMAN - CIRCULATION & VEIN SUPPORT FOR HEALTHY LEGS ( DIOSMIN, BUTCHERS BROOM, HORSE CHESTNUT, HESPERIDIN, RUTIN, GRAPE SEED, PINE BARK)    [provider]  OVER THE COUNTER MEDICATION Take 1 capsule by mouth every other day. ANDREW LESSMAN- CALCIUM  MAGNESIUM INTENSIVE CARE ( MAGNESIUM, VITAMIN D3, BORON)    [provider]  OVER THE COUNTER MEDICATION Take 1 capsule by mouth every other day. ANDREW LESSMAN- ESSENTIAL 1 ( METHYL B12 1200MCG, BIOTIN, COQ10, VITAMIN K2, MK7, METHY FOLATE, LUTEIN, LYCOPENE, ZEAXANTHIN)    [provider]  OVER THE COUNTER MEDICATION Take 1 capsule by mouth every other day. ANDREW LESSMAN- LIVER ANTI OXIDANT EXTRACTS (MILK THISTLE, TUMERIC, ARTICHOKE )    [provider]   OVER THE COUNTER MEDICATION Take 1 capsule by mouth every other day. ANDREW LESSMAN- TURMERIC 400 CURCUMIN ( OPTIMIZED TUMERIC EXTRACT ACTIVATED PHOSPHOLIPID COMPLEX )    [provider]  OVER THE COUNTER MEDICATION Take 1 capsule by mouth every other day. ANDREW LESSMAN- GLUCOSAMINE 1500 CHONDROITIN 1200    [provider]  OVER THE COUNTER MEDICATION Take 1 capsule by mouth every other day. ANDREW LESSMAN- HEALTHY HAIR SKIN & NAILS ( 5000MCG BIOTIN, COMPLEX B, MSM)    [provider]    Allergies: Epinephrine and Procaine hcl    Review of Systems  Constitutional: Negative.   HENT: Negative.    Respiratory: Negative.    Cardiovascular: Negative.   Gastrointestinal: Negative.   Genitourinary: Negative.   Musculoskeletal:        Left arm pain  Skin: Negative.   Neurological: Negative.   All other systems reviewed and are negative.   Updated Vital Signs BP (!) 147/107 (BP Location: Right Arm)   Pulse 80   Temp 97.7 F (36.5 C) (Oral)   Resp 17   SpO2 100%   Physical Exam Vitals and nursing note reviewed.  Constitutional:      General: She is not in acute distress.    Appearance: She is well-developed. She is not ill-appearing, toxic-appearing or diaphoretic.  HENT:     Head: Normocephalic and atraumatic.  Eyes:     Pupils: Pupils are equal, round, and reactive to light.  Cardiovascular:     Rate  and Rhythm: Normal rate and regular rhythm.     Pulses: Normal pulses.          Radial pulses are 2+ on the right side and 2+ on the left side.     Heart sounds: Normal heart sounds.  Pulmonary:     Effort: Pulmonary effort is normal. No respiratory distress.     Breath sounds: Normal breath sounds.  Abdominal:     General: There is no distension.     Palpations: Abdomen is soft.  Musculoskeletal:        General: Normal range of motion.     Cervical back: Normal range of motion and neck supple.     Comments: Tenderness left distal forearm. Non  tender left hand, humerus. No midline spinal tenderness. Non tender right upper and lower extremity. Compartments soft  Skin:    General: Skin is warm and dry.     Capillary Refill: Capillary refill takes less than 2 seconds.     Comments: No lacerations  Neurological:     General: No focal deficit present.     Mental Status: She is alert and oriented to person, place, and time.     Sensory: No sensory deficit.     Motor: No weakness.     Comments: Equal strength, intact sensation     (all labs ordered are listed, but only abnormal results are displayed) Labs Reviewed - No data to display  EKG: None  Radiology: DG Forearm Left Result Date: 11/09/2024 EXAM: VIEW(S) XRAY OF THE LEFT FOREARM 11/09/2024 12:20:00 PM COMPARISON: Same-day risk radiographs CLINICAL HISTORY: fall onto left wrist FINDINGS: FINDINGS: BONES AND JOINTS: Nondisplaced fracture of the distal radius. SOFT TISSUES: Mild wrist soft tissue swelling. IMPRESSION: 1. Nondisplaced  distal radius fracture. Electronically signed by: Michaeline Blanch MD 11/09/2024 01:50 PM EST RP Workstation: [id number]   DG Wrist Complete Left Result Date: 11/09/2024 EXAM: 3 OR MORE VIEW(S) XRAY OF THE LEFT WRIST 11/09/2024 12:20:00 PM COMPARISON: Same day forearm radiographs. CLINICAL HISTORY: Fall onto left wrist. FINDINGS: BONES AND JOINTS: Acute nondisplaced, impacted, and intra-articular distal radius fracture. Degenerative changes of the first [facility] and triscaphe joints. No malalignment. SOFT TISSUES: Soft tissue swelling of the wrist. IMPRESSION: 1. Nondisplaced, impacted, distal radius fracture. Electronically signed by: Michaeline Blanch MD 11/09/2024 01:49 PM EST RP Workstation: [id number]     .Splint Application  Date/Time: 11/09/2024 3:18 PM  Performed by: Edie Rosebud LABOR, PA-C Authorized by: Edie Rosebud LABOR, PA-C   Consent:    Consent obtained:  Verbal   Consent given by:  Patient   Risks, benefits, and alternatives were  discussed: yes     Risks discussed:  Discoloration, numbness, pain and swelling   Alternatives discussed:  No treatment, delayed treatment, alternative treatment, observation and referral Universal protocol:    Procedure explained and questions answered to patient or proxy's satisfaction: yes     Relevant documents present and verified: yes     Test results available: yes     Imaging studies available: yes     Required blood products, implants, devices, and special equipment available: yes     Site/side marked: yes     Immediately prior to procedure a time out was called: yes     Patient identity confirmed:  Verbally with patient Pre-procedure details:    Distal neurologic exam:  Normal   Distal perfusion: distal pulses strong   Procedure details:    Location:  Wrist   Wrist location:  L wrist  Cast type:  Short arm   Splint type:  Sugar tong   Supplies:  Cotton padding, fiberglass and elastic bandage   Attestation: Splint applied and adjusted personally by me   Post-procedure details:    Distal neurologic exam:  Normal   Distal perfusion: distal pulses strong and brisk capillary refill     Procedure completion:  Tolerated well, no immediate complications   Post-procedure imaging: not applicable      Medications Ordered in the ED  oxyCODONE -acetaminophen  (PERCOCET/ROXICET) 5-325 MG per tablet 1 tablet (1 tablet Oral Given 11/09/24 1450)    83 yo with mechanical fall yesterday. Pain to left distal forearm. Non displaced. Compartments soft. NV intact. Denies hitting head, LOC, anticoagulation.  Ambulatory here.  Imaging personally viewed and interpreted: Xray shows non displaced distal radius fracture.  Plan on splint.   NV intact after splint placement  Will fu with hand surgery outpatient.  Return for new or worsening symptoms  The patient has been appropriately medically screened and/or stabilized in the ED. I have low suspicion for any other emergent medical condition  which would require further screening, evaluation or treatment in the ED or require inpatient management.  Patient is hemodynamically stable and in no acute distress.  Patient able to ambulate in department prior to ED.  Evaluation does not show acute pathology that would require ongoing or additional emergent interventions while in the emergency department or further inpatient treatment.  I have discussed the diagnosis with the patient and answered all questions.  Pain is been managed while in the emergency department and patient has no further complaints prior to discharge.  Patient is comfortable with plan discussed in room and is stable for discharge at this time.  I have discussed strict return precautions for returning to the emergency department.  Patient was encouraged to follow-up with PCP/specialist refer to at discharge.                                   Medical Decision Making Amount and/or Complexity of Data Reviewed Independent Historian: spouse External Data Reviewed: labs, radiology and notes. Radiology: ordered and independent interpretation performed. Decision-making details documented in ED Course.  Risk OTC drugs. Prescription drug management. Diagnosis or treatment significantly limited by social determinants of health.        Final diagnoses:  Closed fracture of distal end of left radius, unspecified fracture morphology, initial encounter    ED Discharge Orders          Ordered    oxyCODONE -acetaminophen  (PERCOCET/ROXICET) 5-325 MG tablet  Every 6 hours PRN        11/09/24 1505               Ianna Salmela A, PA-C 11/09/24 1529  "

## 2024-11-09 NOTE — ED Notes (Signed)
 Sugar tong performed by Glenys EMT

## 2024-11-09 NOTE — ED Triage Notes (Addendum)
 Patient reports fall last night. Left arm hit exercise machine Pain to left wrist and forearm. Denies hitting head. Denies blood thinners.
